# Patient Record
Sex: Female | Born: 1990 | Race: Black or African American | Hispanic: No | Marital: Single | Smoking: Former smoker
Health system: Southern US, Community
[De-identification: ages and names within clinical notes are randomized; demographics above are authoritative.]

## PROBLEM LIST (undated history)

## (undated) ENCOUNTER — Inpatient Hospital Stay (HOSPITAL_COMMUNITY): Payer: Self-pay

## (undated) DIAGNOSIS — L709 Acne, unspecified: Secondary | ICD-10-CM

## (undated) DIAGNOSIS — J189 Pneumonia, unspecified organism: Secondary | ICD-10-CM

## (undated) DIAGNOSIS — K219 Gastro-esophageal reflux disease without esophagitis: Secondary | ICD-10-CM

## (undated) DIAGNOSIS — A749 Chlamydial infection, unspecified: Secondary | ICD-10-CM

## (undated) HISTORY — DX: Pneumonia, unspecified organism: J18.9

## (undated) HISTORY — PX: OTHER SURGICAL HISTORY: SHX169

---
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
## 2011-01-04 ENCOUNTER — Inpatient Hospital Stay (HOSPITAL_COMMUNITY)
Admission: AD | Admit: 2011-01-04 | Discharge: 2011-01-04 | Disposition: A | Discharge status: Home / Self Care | Payer: Medicaid Other | Source: Ambulatory Visit | Attending: Obstetrics & Gynecology | Admitting: Obstetrics & Gynecology

## 2011-01-04 ENCOUNTER — Encounter (HOSPITAL_COMMUNITY): Payer: Self-pay | Admitting: *Deleted

## 2011-01-04 DIAGNOSIS — R109 Unspecified abdominal pain: Secondary | ICD-10-CM

## 2011-01-04 DIAGNOSIS — O26899 Other specified pregnancy related conditions, unspecified trimester: Secondary | ICD-10-CM

## 2011-01-04 DIAGNOSIS — O99891 Other specified diseases and conditions complicating pregnancy: Secondary | ICD-10-CM | POA: Insufficient documentation

## 2011-01-04 NOTE — Progress Notes (Signed)
Pt called for the 2nd time-she is not in the [place]-Kim,the registration clerk sttes she left

## 2011-01-04 NOTE — Progress Notes (Signed)
Pt states she was seen x2 days ago in MAU and had a +upt. Pt states she gave the wrong LMP, pt states she thinks she is 4 months. C/o lower abdominal cramping that started today, no vaginal bleeding.

## 2011-01-04 NOTE — Progress Notes (Signed)
Pt states she was here a couple days ago and found out she was preg. Based on her last LMP-sttes she thinks she gave the wrong LMP and wants to find out just how far along she is

## 2011-01-04 NOTE — ED Provider Notes (Signed)
History   Pt presents today wishing to have preg confirmation. She states she was seen several days ago but did not give the correct LMP information. She also now c/o mild lower abd cramping that comes and goes. She denies pain at this time.  Chief Complaint  Patient presents with  . Possible Pregnancy   HPI  OB History    Grav Para Term Preterm Abortions TAB SAB Ect Mult Living   3    1 1           Past Medical History  Diagnosis Date  . Asthma   . Chlamydia     Past Surgical History  Procedure Date  . Pregnancy termination     No family history on file.  History  Substance Use Topics  . Smoking status: Current Everyday Smoker -- 0.2 packs/day  . Smokeless tobacco: Not on file  . Alcohol Use: No    Allergies: No Known Allergies  Prescriptions prior to admission  Medication Sig Dispense Refill  . albuterol (VENTOLIN HFA) 108 (90 BASE) MCG/ACT inhaler Inhale 2 puffs into the lungs every 4 (four) hours as needed.  1 Inhaler  0  . beclomethasone (QVAR) 40 MCG/ACT inhaler Inhale 1 puff into the lungs 2 (two) times daily. 1 puff in the AM and 1 puff in the PM  1 Inhaler  1  . triamcinolone (KENALOG) 0.1 % ointment Apply topically 2 (two) times daily as needed. Apply to eczema. 80 GM tube       . Cetirizine HCl (ZYRTEC ALLERGY) 10 MG TBDP Take 1 tablet by mouth daily.  30 tablet  3  . metroNIDAZOLE (FLAGYL) 500 MG tablet Take 500 mg by mouth 2 (two) times daily. Take 1 tab by mouth two times a day x7 days       . SINGULAIR 10 MG tablet TAKE 1 TABLET BY MOUTH AT BEDTIME  30 tablet  6    Review of Systems  Constitutional: Negative for fever.  Cardiovascular: Negative for chest pain.  Gastrointestinal: Negative for nausea, vomiting, abdominal pain, diarrhea and constipation.  Genitourinary: Negative for dysuria, urgency, frequency and hematuria.  Neurological: Negative for dizziness and headaches.  Psychiatric/Behavioral: Negative for depression and suicidal ideas.    Physical Exam   Blood pressure 128/57, pulse 66, temperature 98.2 F (36.8 C), temperature source Oral, resp. rate 20, height 5\' 3"  (1.6 m), weight 198 lb (89.812 kg), last menstrual period 11/02/2010, unknown if currently breastfeeding.  Physical Exam  Constitutional: She is oriented to person, place, and time. She appears well-developed and well-nourished. No distress.  HENT:  Head: Normocephalic and atraumatic.  Eyes: EOM are normal. Pupils are equal, round, and reactive to light.  GI: Soft. She exhibits no distension. There is no tenderness. There is no rebound and no guarding.  Neurological: She is alert and oriented to person, place, and time.  Skin: Skin is warm and dry. She is not diaphoretic.  Psychiatric: She has a normal mood and affect. Her behavior is normal. Judgment and thought content normal.    MAU Course  Procedures  Bedside US shows single IUP at 13.1wks with cardiac activity.  Assessment and Plan  Pregnancy: discussed with pt at length. She will begin prenatal care. Discussed diet, activity, risks, and precautions.  Clinton Gallant. Rice III, DrHSc, MPAS, PA-C  01/04/2011, 10:49 PM   Henrietta Hoover, PA 01/04/11 2256

## 2011-01-04 NOTE — Progress Notes (Signed)
Pt called and she is not in the [place]

## 2011-01-19 ENCOUNTER — Other Ambulatory Visit: Payer: Self-pay

## 2011-01-19 DIAGNOSIS — Z331 Pregnant state, incidental: Secondary | ICD-10-CM

## 2011-01-19 NOTE — Progress Notes (Signed)
Prenatal labs done today Gail Walker 

## 2011-01-20 LAB — OBSTETRIC PANEL
Basophils Absolute: 0 10*3/uL (ref 0.0–0.1)
HCT: 37.3 % (ref 36.0–46.0)
Hepatitis B Surface Ag: NEGATIVE
Lymphocytes Relative: 22 % (ref 12–46)
Neutro Abs: 4.5 10*3/uL (ref 1.7–7.7)
Platelets: 262 10*3/uL (ref 150–400)
RDW: 14.2 % (ref 11.5–15.5)
Rubella: 37.1 IU/mL — ABNORMAL HIGH
WBC: 7.4 10*3/uL (ref 4.0–10.5)

## 2011-01-20 LAB — SICKLE CELL SCREEN: Sickle Cell Screen: NEGATIVE

## 2011-01-21 LAB — CULTURE, OB URINE: Colony Count: 8000

## 2011-01-26 ENCOUNTER — Ambulatory Visit (INDEPENDENT_AMBULATORY_CARE_PROVIDER_SITE_OTHER): Payer: Self-pay | Admitting: Family Medicine

## 2011-01-26 DIAGNOSIS — N760 Acute vaginitis: Secondary | ICD-10-CM

## 2011-01-26 DIAGNOSIS — O0990 Supervision of high risk pregnancy, unspecified, unspecified trimester: Secondary | ICD-10-CM | POA: Insufficient documentation

## 2011-01-26 DIAGNOSIS — J45909 Unspecified asthma, uncomplicated: Secondary | ICD-10-CM

## 2011-01-26 DIAGNOSIS — Z331 Pregnant state, incidental: Secondary | ICD-10-CM

## 2011-01-26 HISTORY — DX: Supervision of high risk pregnancy, unspecified, unspecified trimester: O09.90

## 2011-01-26 LAB — POCT WET PREP (WET MOUNT): Trichomonas Wet Prep HPF POC: NEGATIVE

## 2011-01-26 MED ORDER — PRENATAL RX 60-1 MG PO TABS: 1.0000 | ORAL_TABLET | Freq: Every day | ORAL | Status: DC

## 2011-01-26 MED ORDER — CETIRIZINE HCL 10 MG PO TBDP: 1.0000 | ORAL_TABLET | Freq: Every day | ORAL | Status: DC

## 2011-01-26 MED ORDER — TRIAMCINOLONE ACETONIDE 0.1 % EX OINT: TOPICAL_OINTMENT | Freq: Two times a day (BID) | CUTANEOUS | Status: DC | PRN

## 2011-01-26 MED ORDER — ALBUTEROL SULFATE HFA 108 (90 BASE) MCG/ACT IN AERS: 2.0000 | INHALATION_SPRAY | RESPIRATORY_TRACT | Status: DC | PRN

## 2011-01-26 NOTE — Patient Instructions (Signed)
Pregnancy - First Trimester During sexual intercourse, millions of sperm go into the vagina. Only 1 sperm will penetrate and fertilize the female egg while it is in the Fallopian tube. One week later, the fertilized egg implants into the wall of the uterus. An embryo begins to develop into a baby. At 6 to 8 weeks, the eyes and face are formed and the heartbeat can be seen on ultrasound. At the end of 12 weeks (first trimester), all the baby's organs are formed. Now that you are pregnant, you will want to do everything you can to have a healthy baby. Two of the most important things are to get good prenatal care and follow your caregiver's instructions. Prenatal care is all the medical care you receive before the baby's birth. It is given to prevent, find and treat problems during the pregnancy and childbirth. PRENATAL EXAMS:  During prenatal visits, your weight, blood pressure and urine are checked. This is done to make sure you are healthy and progressing normally during the pregnancy.   A pregnant woman should gain 25 to 35 pounds during the pregnancy. However, if you are over weight or underweight, your caregiver will advise you regarding your weight.   Your caregiver will ask and answer questions for you.   Blood work, cervical cultures, other necessary tests and a Pap test are done during your prenatal exams. These tests are done to check on your health and the probable health of your baby. Tests are strongly recommended and done for HIV with your permission. This is the virus that causes AIDS. These tests are done because medications can be given to help prevent your baby from being born with this infection should you have been infected without knowing it. Blood work is also used to find out your blood type, previous infections and follow your blood levels (hemoglobin).   Low hemoglobin (anemia) is common during pregnancy. Iron and vitamins are given to help prevent this. Later in the pregnancy,  blood tests for diabetes will be done along with any other tests if any problems develop. You may need tests to make sure you and the baby are doing well.   You may need other tests to make sure you and the baby are doing well.  CHANGES DURING THE FIRST TRIMESTER (THE FIRST 3 MONTHS OF PREGNANCY) Your body goes through many changes during pregnancy. They vary from person to person. Talk to your caregiver about changes you notice and are concerned about. Changes can include:  Your menstrual period stops.   The egg and sperm carry the genes that determine what you look like. Genes from you and your partner are forming a baby. The female genes determine whether the baby is a boy or a girl.   Your body increases in girth and you may feel bloated.   Feeling sick to your stomach (nauseous) and throwing up (vomiting). If the vomiting is uncontrollable, call your caregiver.   Your breasts will begin to enlarge and become tender.   Your nipples may stick out more and become darker.   The need to urinate more. Painful urination may mean you have a bladder infection.   Tiring easily.   Loss of appetite.   Cravings for certain kinds of food.   At first, you may gain or lose a couple of pounds.   You may have changes in your emotions from day to day (excited to be pregnant or concerned something may go wrong with the pregnancy and baby).     You may have more vivid and strange dreams.  HOME CARE INSTRUCTIONS  It is very important to avoid all smoking, alcohol and un-prescribed drugs during your pregnancy. These affect the formation and growth of the baby. Avoid chemicals while pregnant to ensure the delivery of a healthy infant.   Start your prenatal visits by the 12th week of pregnancy. They are usually scheduled monthly at first, then more often in the last 2 months before delivery. Keep your caregiver's appointments. Follow your caregiver's instructions regarding medication use, blood and lab  tests, exercise, and diet.   During pregnancy, you are providing food for you and your baby. Eat regular, well-balanced meals. Choose foods such as meat, fish, milk and other low fat dairy products, vegetables, fruits, and whole-grain breads and cereals. Your caregiver will tell you of the ideal weight gain.   You can help morning sickness by keeping soda crackers (saltines) at the bedside. Eat a couple before arising in the morning. You may want to use the crackers without salt on them.   Eating 4 to 5 small meals rather than 3 large meals a day also may help the nausea and vomiting.   Drinking liquids between meals instead of during meals also seems to help nausea and vomiting.   A physical sexual relationship may be continued throughout pregnancy if there are no other problems. Problems may be early (premature) leaking of amniotic fluid from the membranes, vaginal bleeding, or belly (abdominal) pain.   Exercise regularly if there are no restrictions. Check with your caregiver or physical therapist if you are unsure of the safety of some of your exercises. Greater weight gain will occur in the last 2 trimesters of pregnancy. Exercising will help:   Control your weight.   Keep you in shape.   Prepare you for labor and delivery.   Help you lose your pregnancy weight after you deliver your baby.   Wear a good support or jogging bra for breast tenderness during pregnancy. This may help if worn during sleep too.   Ask when prenatal classes are available. Begin classes when they are offered.   Do not use hot tubs, steam rooms or saunas.   Wear your seat belt when driving. This protects you and your baby if you are in an accident.   Avoid raw meat, uncooked cheese, cat litter boxes and soil used by cats throughout the pregnancy. These carry germs that can cause birth defects in the baby.   The first trimester is a good time to visit your dentist for your dental health. Getting your teeth  cleaned is OK. Use a softer toothbrush and brush gently during pregnancy.   Ask for help if you have financial, counseling or nutritional needs during pregnancy. Your caregiver will be able to offer counseling for these needs as well as refer you for other special needs.   Do not take any medications or herbs unless told by your caregiver.   Inform your caregiver if there is any mental or physical domestic violence.   Make a list of emergency phone numbers of family, friends, hospital, police and fire department.   Write down your questions. Take them to your prenatal visit.   Do not douche.   Do not cross your legs.   If you have to stand for long periods of time, rotate you feet or take small steps in a circle.   You may have more vaginal secretions that may require a sanitary pad. Do not use tampons   or scented sanitary pads.  MEDICATIONS AND DRUG USE IN PREGNANCY  Take prenatal vitamins as directed. The vitamin should contain 1 milligram of folic acid. Keep all vitamins out of reach of children. Only a couple vitamins or tablets containing iron may be fatal to a baby or young child when ingested.   Avoid use of all medications, including herbs, over-the-counter medications, not prescribed or suggested by your caregiver. Only take over-the-counter or prescription medicines for pain, discomfort, or fever as directed by your caregiver. Do not use aspirin, ibuprofen (Motrin, Advil, Nuprin) or naproxen (Aleve) unless OK'd by your caregiver.   Let your caregiver also know about herbs you may be using.   Alcohol is related to a number of birth defects. This includes fetal alcohol syndrome. All alcohol, in any form, should be avoided completely. Smoking will cause low birth rate and premature babies.   Street/illegal drugs are very harmful to the baby. They are absolutely forbidden. A baby born to an addicted mother will be addicted at birth. The baby will go through the same withdrawal  an adult does.   Let your caregiver know about any medications that you have to take and for what reason you take them.  MISCARRIAGE IS COMMON DURING PREGNANCY A miscarriage does not mean you did something wrong. It is not a reason to worry about getting pregnant again. Your caregiver will help you with questions you may have. If you have a miscarriage, you may need minor surgery (a D & C). SEEK MEDICAL CARE IF:  You have any concerns or worries during your pregnancy. It is better to call with your questions if you feel they cannot wait, rather than worry about them.  SEEK IMMEDIATE MEDICAL CARE IF:  An unexplained oral temperature above 101 develops, or as your caregiver suggests.   You have leaking of fluid from the vagina (birth canal). If leaking membranes are suspected, take your temperature and inform your caregiver of this when you call.   There is vaginal spotting or bleeding. Notify your caregiver of the amount and how many pads are used.   You develop a bad smelling vaginal discharge with a change in the color.   You continue to feel sick to your stomach (nauseated) and have no relief from remedies suggested. You vomit blood or coffee ground like materials.   You lose more than 2 pounds of weight in one week.   You gain more than 2 pounds of weight in a week and you notice swelling of your face, hands, feet or legs.   You gain 5 pounds or more in 1 week (even if you do not have swelling of your hands, face, legs or feet).   You get exposed to German measles and have never had them.   You are exposed to fifth disease or chicken pox.   You develop belly (abdominal) pain. Round ligament discomfort is a common non-cancerous (benign) cause of abdominal pain in pregnancy. Your caregiver still must evaluate this.   You develop headache, fever, diarrhea, pain with urination, or shortness of breath.   You fall, are in a car accident or have any kind of trauma.   There is mental  or physical violence in your home.  Document Released: 03/29/2001 Document Re-Released: 09/22/2009 [employer] Patient Information 2011 [employer]. 

## 2011-01-27 LAB — GC/CHLAMYDIA PROBE AMP, GENITAL: GC Probe Amp, Genital: NEGATIVE

## 2011-01-27 MED ORDER — MICONAZOLE NITRATE 200 MG VA SUPP: 200.0000 mg | Freq: Every day | VAGINAL | Status: AC

## 2011-01-27 NOTE — Assessment & Plan Note (Signed)
Wet prep showed yeast. Will treat with Miconazole per vagina

## 2011-01-27 NOTE — Progress Notes (Signed)
Gail Walker is a 20 year old female who was found to be incidentally pregnant while here in clinic back in August. She's been seen in [place] [facility] in the past several weeks for pregnancy confirmation. She did have a 10 week ultrasound which confirmed intrauterine pregnancy. Her due date she cannot remember her last menstrual period is based on her ultrasound.  She's been doing well and has no complaints currently. She previously was considering an abortion but did not have the funds this. She is nervous about being pregnant. She's had no vaginal bleeding, abdominal pain, dysuria. She does state she's had some increased urinary frequency for the last 2 weeks but no dysuria. She has also had some increased vaginal discharge.  Gen:  Alert, cooperative patient who appears stated age in no acute distress.  Vital signs reviewed. Cardiac:  Regular rate and rhythm without murmur auscultated.  Good S1/S2. Pulm:  Clear to auscultation bilaterally with good air movement.  No wheezes or rales noted.   Abd:  Fundal heights consistent with dates.  FHTs good.  Good bowel sounds.  No pain on palpation. GYN:  External genitalia within normal limits.  Vaginal mucosa pink, moist, normal rugae.  Nonfriable cervix without lesions, some thick whitish discharge noted, but no bleeding noted on speculum exam.  Bimanual exam revealed normal, nongravid uterus.  No cervical motion tenderness. No adnexal masses bilaterally.    A/P:  A 20 year old G3 P0 010 at 78.[redacted] weeks gestational age by a second trimester ultrasound: - She is doing well from a pregnancy standpoint. - Will obtain anatomy ultrasound today.  She does seem to be pretty nervous about pregnancy. She states that she is excited sometimes about being pregnant. - Declined genetic screening today - Gave red flags in verbal warnings regarding preterm labor and reasons to return or go to the [facility]. - Obtained GC and chlamydia today.  - Sent patient to bathroom to obtain urine culture  and urinalysis as she has had increased urinary frequency for the past week. However since the patient left and did not leave a urine specimen. -  FU in 4 weeks

## 2011-01-31 ENCOUNTER — Encounter: Payer: Self-pay | Admitting: Family Medicine

## 2011-01-31 NOTE — Progress Notes (Addendum)
Note reviewed.  Agree with plan. 1) Teen - support resources could include [employer] trader if Medicaid eligible, [facility] and Teen programs at [place].   2) RH negative - needs repeat antibody titers and Rhogam at 28 weeks. 3) Obesity - needs early glucola ASAP 4) Pregnancy - Dating by 13 week sono.  Needs flu shot.  Also likely needs Tdap after 20 weeks. 5) Smoker - cessation could prevent complications to pt, fetus and infant.

## 2011-02-01 ENCOUNTER — Telehealth: Payer: Self-pay | Admitting: Family Medicine

## 2011-02-01 NOTE — Telephone Encounter (Signed)
Gail Walker is calling to find out when her appointment for her Ultrasound is going to be.

## 2011-02-01 NOTE — Telephone Encounter (Signed)
PT  Informed of appt.Gail Walker [place]

## 2011-02-02 ENCOUNTER — Telehealth: Payer: Self-pay | Admitting: *Deleted

## 2011-02-02 NOTE — Telephone Encounter (Signed)
Pt informed of appt for [place] at [facility] on 10.29.2012 @ 215 pm.Gail Walker, [name]

## 2011-02-14 ENCOUNTER — Ambulatory Visit (HOSPITAL_COMMUNITY)
Admission: RE | Admit: 2011-02-14 | Discharge: 2011-02-14 | Disposition: A | Discharge status: Home / Self Care | Payer: Self-pay | Source: Ambulatory Visit | Attending: Family Medicine | Admitting: Family Medicine

## 2011-02-14 DIAGNOSIS — Z331 Pregnant state, incidental: Secondary | ICD-10-CM

## 2011-02-14 DIAGNOSIS — Z3689 Encounter for other specified antenatal screening: Secondary | ICD-10-CM | POA: Insufficient documentation

## 2011-02-23 ENCOUNTER — Ambulatory Visit (INDEPENDENT_AMBULATORY_CARE_PROVIDER_SITE_OTHER): Payer: Self-pay | Admitting: Family Medicine

## 2011-02-23 VITALS — BP 127/75 | Temp 97.4°F | Wt 204.9 lb

## 2011-02-23 DIAGNOSIS — Z331 Pregnant state, incidental: Secondary | ICD-10-CM

## 2011-02-23 DIAGNOSIS — O26899 Other specified pregnancy related conditions, unspecified trimester: Secondary | ICD-10-CM

## 2011-02-23 DIAGNOSIS — Z6791 Unspecified blood type, Rh negative: Secondary | ICD-10-CM

## 2011-02-23 DIAGNOSIS — F17200 Nicotine dependence, unspecified, uncomplicated: Secondary | ICD-10-CM

## 2011-02-23 DIAGNOSIS — O360990 Maternal care for other rhesus isoimmunization, unspecified trimester, not applicable or unspecified: Secondary | ICD-10-CM

## 2011-02-23 HISTORY — DX: Unspecified blood type, rh negative: Z67.91

## 2011-02-23 NOTE — Assessment & Plan Note (Signed)
Counseled to quit 

## 2011-02-23 NOTE — Assessment & Plan Note (Signed)
Repeat Ab testing and Rhogam at 28 weeks.    

## 2011-02-23 NOTE — Progress Notes (Signed)
20 yo G2P0010 at 20.[redacted] wks GA via LMP and confirmed with 2nd trimester [place] here for OB visit. - doing well today, no concerns. - discussed glucola, she doesn't have time today but will be back within the week for lab appt. - smoking: states she's very much cut back, only 1 or 2 "sometimes."  Counseled to quit completely - discussed referral to [facility].  Will flag Dr. [place] to ask how to best do this - states that Medicaid called her and she should receive in 1 week  - flu shot given today

## 2011-02-23 NOTE — Patient Instructions (Signed)
Make an appt with [facility].  It's here in this office. Come back and see me in 4 weeks if you can't be seen for a while.   In the next week come back for a lab appt to test your sugar.  It was good to see you today.

## 2011-02-26 NOTE — Assessment & Plan Note (Signed)
Adding to visit diagnoses

## 2011-02-28 ENCOUNTER — Telehealth: Payer: Self-pay | Admitting: Family Medicine

## 2011-02-28 NOTE — Telephone Encounter (Signed)
Called and set up referral for [facility] today. They state that will give her a call

## 2011-03-03 ENCOUNTER — Other Ambulatory Visit: Payer: Self-pay

## 2011-03-17 ENCOUNTER — Ambulatory Visit (INDEPENDENT_AMBULATORY_CARE_PROVIDER_SITE_OTHER): Payer: Self-pay | Admitting: Family Medicine

## 2011-03-17 VITALS — BP 107/71 | Temp 97.3°F | Wt 207.4 lb

## 2011-03-17 DIAGNOSIS — Z331 Pregnant state, incidental: Secondary | ICD-10-CM

## 2011-03-17 NOTE — Progress Notes (Signed)
EMR does not indicate flu vaccination status for patient.  To verify at next visit and offer influenza vaccine at next visit. Chevy Sweigert O

## 2011-03-17 NOTE — Patient Instructions (Signed)
It was a pleasure to meet you today in OB clinic!  You are 23 weeks and 3 days into your pregnancy.   Congratulations on quitting smoking!  Your baby's heart rate is normal today; your uterus size is bigger than I would expect for your gestational age.  Therefore, I would like to order another ultrasound to check on this.  I am going to put our social worker in touch with you to be sure your Medicaid is going through properly.   You will come back to see Dr. Gwendolyn Grant in 4 more weeks.  At this time you will get a shot, called RhoGam, that must be given at a specific time in pregnancy.  It is VERY IMPORTANT that you come for this visit at around 27-28 weeks.

## 2011-03-17 NOTE — Progress Notes (Signed)
Seen in OB clinic, now EGA 23.3.  RH NEGATIVE, discussed need for Ab titer and RhoGam to be given at next visit in 4 weeks.   Size>dates today; will order repeat US at [facility].  She states that she was told at [facility] office to bring them her bills, they would pay.  I am forwarding to CSW for confirmation of her Medicaid status.  1hrGTT being done today (early), for risk factors of ethnicity and obesity, now LGA.  Asthma doing better, not using rescue or QVar at all. Quit smoking at beginning of pregnancy.   She screens negative on verbal PHQ2; working at [employer], feels good support from mother (smoker). Not interested in [place] or other social support networks.  To consider continued surveillance for depression. Follow up OB with PCP in 4 weeks, RHOGAM and AB TITER then.  Lora Chavers O

## 2011-03-22 ENCOUNTER — Ambulatory Visit (HOSPITAL_COMMUNITY)
Admission: RE | Admit: 2011-03-22 | Discharge: 2011-03-22 | Disposition: A | Discharge status: Home / Self Care | Payer: Medicaid Other | Source: Ambulatory Visit | Attending: Family Medicine | Admitting: Family Medicine

## 2011-03-22 DIAGNOSIS — Z3689 Encounter for other specified antenatal screening: Secondary | ICD-10-CM | POA: Insufficient documentation

## 2011-03-22 DIAGNOSIS — O3660X0 Maternal care for excessive fetal growth, unspecified trimester, not applicable or unspecified: Secondary | ICD-10-CM | POA: Insufficient documentation

## 2011-03-29 ENCOUNTER — Telehealth: Payer: Self-pay | Admitting: Clinical

## 2011-03-29 NOTE — Telephone Encounter (Signed)
Clinical Child psychotherapist (CSW) received a referral to assist pt in making sure Medicaid goes through properly. CSW left a message for both numbers listed on pt demographics and informed pt that CSW will be in the office Tues-Thurs 8:30am-11am.  Theresia Bough, MSW, Theresia Majors [phone]

## 2011-04-06 ENCOUNTER — Ambulatory Visit: Payer: Self-pay | Admitting: Family Medicine

## 2011-04-06 VITALS — BP 122/76 | Wt 213.0 lb

## 2011-04-06 DIAGNOSIS — Z331 Pregnant state, incidental: Secondary | ICD-10-CM

## 2011-04-06 DIAGNOSIS — J45909 Unspecified asthma, uncomplicated: Secondary | ICD-10-CM

## 2011-04-06 DIAGNOSIS — Z6791 Unspecified blood type, Rh negative: Secondary | ICD-10-CM

## 2011-04-06 NOTE — Assessment & Plan Note (Signed)
Repeat Ab testing and Rhogam at 28 weeks.

## 2011-04-06 NOTE — Assessment & Plan Note (Signed)
No symptoms currently per patient.  Has not needed inhaler

## 2011-04-06 NOTE — Progress Notes (Signed)
Measuring roughly at age today.  Repeat US showed growth at 50%. Patient has received flu vaccine.   No complaints currently: no bleeding, discharge, cramping, abdominal pain.  Some difficulty sleeping at night as she cannot find comfortable position, but denies any back pain.

## 2011-04-07 MED ORDER — RHO D IMMUNE GLOBULIN 1500 UNIT/2ML IJ SOLN: 300.0000 ug | Freq: Once | INTRAMUSCULAR | Status: DC

## 2011-04-26 ENCOUNTER — Ambulatory Visit (INDEPENDENT_AMBULATORY_CARE_PROVIDER_SITE_OTHER): Payer: Self-pay | Admitting: Family Medicine

## 2011-04-26 VITALS — BP 122/72 | Temp 97.6°F | Wt 217.0 lb

## 2011-04-26 DIAGNOSIS — L709 Acne, unspecified: Secondary | ICD-10-CM

## 2011-04-26 DIAGNOSIS — R21 Rash and other nonspecific skin eruption: Secondary | ICD-10-CM | POA: Insufficient documentation

## 2011-04-26 DIAGNOSIS — L708 Other acne: Secondary | ICD-10-CM

## 2011-04-26 DIAGNOSIS — R12 Heartburn: Secondary | ICD-10-CM | POA: Insufficient documentation

## 2011-04-26 DIAGNOSIS — Z6791 Unspecified blood type, Rh negative: Secondary | ICD-10-CM

## 2011-04-26 DIAGNOSIS — O360990 Maternal care for other rhesus isoimmunization, unspecified trimester, not applicable or unspecified: Secondary | ICD-10-CM

## 2011-04-26 DIAGNOSIS — Z331 Pregnant state, incidental: Secondary | ICD-10-CM

## 2011-04-26 DIAGNOSIS — O26899 Other specified pregnancy related conditions, unspecified trimester: Secondary | ICD-10-CM

## 2011-04-26 LAB — CBC
MCH: 28.6 pg (ref 26.0–34.0)
MCHC: 33.8 g/dL (ref 30.0–36.0)
MCV: 84.8 fL (ref 78.0–100.0)
Platelets: 228 10*3/uL (ref 150–400)
RDW: 13.5 % (ref 11.5–15.5)

## 2011-04-26 LAB — RPR

## 2011-04-26 MED ORDER — RANITIDINE HCL 150 MG PO TABS: 150.0000 mg | ORAL_TABLET | Freq: Two times a day (BID) | ORAL | Status: DC

## 2011-04-26 MED ORDER — CLINDAMYCIN PHOS-BENZOYL PEROX 1.2-5 % EX GEL: 1.0000 [drp] | Freq: Two times a day (BID) | CUTANEOUS | Status: DC

## 2011-04-26 NOTE — Assessment & Plan Note (Signed)
Duac for relief.

## 2011-04-26 NOTE — Patient Instructions (Signed)
If you have any bleeding, discharge, abdominal pain or cramping, or the contractions like we talked about come back and see me or go to the [facility]U. Come back and see me in 2 weeks.     Pregnancy - Second Trimester  The second trimester of pregnancy (3 to 6 months) is a period of rapid growth for you and your baby. At the end of the sixth month, your baby is about 9 inches long and weighs 1 1/2 pounds. You will begin to feel the baby move between 18 and 20 weeks of the pregnancy. This is called quickening. Weight gain is faster. A clear fluid (colostrum) may leak out of your breasts. You may feel small contractions of the womb (uterus). This is known as false labor or Frett-Hicks contractions. This is like a practice for labor when the baby is ready to be born. Usually, the problems with morning sickness have usually passed by the end of your first trimester. Some women develop small dark blotches (called cholasma, mask of pregnancy) on their face that usually goes away after the baby is born. Exposure to the sun makes the blotches worse. Acne may also develop in some pregnant women and pregnant women who have acne, may find that it goes away. PRENATAL EXAMS  Blood work may continue to be done during prenatal exams. These tests are done to check on your health and the probable health of your baby. Blood work is used to follow your blood levels (hemoglobin). Anemia (low hemoglobin) is common during pregnancy. Iron and vitamins are given to help prevent this. You will also be checked for diabetes between 24 and 28 weeks of the pregnancy. Some of the previous blood tests may be repeated.   The size of the uterus is measured during each visit. This is to make sure that the baby is continuing to grow properly according to the dates of the pregnancy.   Your blood pressure is checked every prenatal visit. This is to make sure you are not getting toxemia.   Your urine is checked to make sure you do not have an  infection, diabetes or protein in the urine.   Your weight is checked often to make sure gains are happening at the suggested rate. This is to ensure that both you and your baby are growing normally.   Sometimes, an ultrasound is performed to confirm the proper growth and development of the baby. This is a test which bounces harmless sound waves off the baby so your caregiver can more accurately determine due dates.  Sometimes, a specialized test is done on the amniotic fluid surrounding the baby. This test is called an amniocentesis. The amniotic fluid is obtained by sticking a needle into the belly (abdomen). This is done to check the chromosomes in instances where there is a concern about possible genetic problems with the baby. It is also sometimes done near the end of pregnancy if an early delivery is required. In this case, it is done to help make sure the baby's lungs are mature enough for the baby to live outside of the womb. CHANGES OCCURING IN THE SECOND TRIMESTER OF PREGNANCY Your body goes through many changes during pregnancy. They vary from person to person. Talk to your caregiver about changes you notice that you are concerned about.  During the second trimester, you will likely have an increase in your appetite. It is normal to have cravings for certain foods. This varies from person to person and pregnancy to  pregnancy.   Your lower abdomen will begin to bulge.   You may have to urinate more often because the uterus and baby are pressing on your bladder. It is also common to get more bladder infections during pregnancy (pain with urination). You can help this by drinking lots of fluids and emptying your bladder before and after intercourse.   You may begin to get stretch marks on your hips, abdomen, and breasts. These are normal changes in the body during pregnancy. There are no exercises or medications to take that prevent this change.   You may begin to develop swollen and bulging  veins (varicose veins) in your legs. Wearing support hose, elevating your feet for 15 minutes, 3 to 4 times a day and limiting salt in your diet helps lessen the problem.   Heartburn may develop as the uterus grows and pushes up against the stomach. Antacids recommended by your caregiver helps with this problem. Also, eating smaller meals 4 to 5 times a day helps.   Constipation can be treated with a stool softener or adding bulk to your diet. Drinking lots of fluids, vegetables, fruits, and whole grains are helpful.   Exercising is also helpful. If you have been very active up until your pregnancy, most of these activities can be continued during your pregnancy. If you have been less active, it is helpful to start an exercise program such as walking.   Hemorrhoids (varicose veins in the rectum) may develop at the end of the second trimester. Warm sitz baths and hemorrhoid cream recommended by your caregiver helps hemorrhoid problems.   Backaches may develop during this time of your pregnancy. Avoid heavy lifting, wear low heal shoes and practice good posture to help with backache problems.   Some pregnant women develop tingling and numbness of their hand and fingers because of swelling and tightening of ligaments in the wrist (carpel tunnel syndrome). This goes away after the baby is born.   As your breasts enlarge, you may have to get a bigger bra. Get a comfortable, cotton, support bra. Do not get a nursing bra until the last month of the pregnancy if you will be nursing the baby.   You may get a dark line from your belly button to the pubic area called the linea nigra.   You may develop rosy cheeks because of increase blood flow to the face.   You may develop spider looking lines of the face, neck, arms and chest. These go away after the baby is born.  HOME CARE INSTRUCTIONS   It is extremely important to avoid all smoking, herbs, alcohol, and unprescribed drugs during your pregnancy.  These chemicals affect the formation and growth of the baby. Avoid these chemicals throughout the pregnancy to ensure the delivery of a healthy infant.   Most of your home care instructions are the same as suggested for the first trimester of your pregnancy. Keep your caregiver's appointments. Follow your caregiver's instructions regarding medication use, exercise and diet.   During pregnancy, you are providing food for you and your baby. Continue to eat regular, well-balanced meals. Choose foods such as meat, fish, milk and other low fat dairy products, vegetables, fruits, and whole-grain breads and cereals. Your caregiver will tell you of the ideal weight gain.   A physical sexual relationship may be continued up until near the end of pregnancy if there are no other problems. Problems could include early (premature) leaking of amniotic fluid from the membranes, vaginal bleeding, abdominal  pain, or other medical or pregnancy problems.   Exercise regularly if there are no restrictions. Check with your caregiver if you are unsure of the safety of some of your exercises. The greatest weight gain will occur in the last 2 trimesters of pregnancy. Exercise will help you:   Control your weight.   Get you in shape for labor and delivery.   Lose weight after you have the baby.   Wear a good support or jogging bra for breast tenderness during pregnancy. This may help if worn during sleep. Pads or tissues may be used in the bra if you are leaking colostrum.   Do not use hot tubs, steam rooms or saunas throughout the pregnancy.   Wear your seat belt at all times when driving. This protects you and your baby if you are in an accident.   Avoid raw meat, uncooked cheese, cat litter boxes and soil used by cats. These carry germs that can cause birth defects in the baby.   The second trimester is also a good time to visit your dentist for your dental health if this has not been done yet. Getting your teeth  cleaned is OK. Use a soft toothbrush. Brush gently during pregnancy.   It is easier to loose urine during pregnancy. Tightening up and strengthening the pelvic muscles will help with this problem. Practice stopping your urination while you are going to the bathroom. These are the same muscles you need to strengthen. It is also the muscles you would use as if you were trying to stop from passing gas. You can practice tightening these muscles up 10 times a set and repeating this about 3 times per day. Once you know what muscles to tighten up, do not perform these exercises during urination. It is more likely to contribute to an infection by backing up the urine.   Ask for help if you have financial, counseling or nutritional needs during pregnancy. Your caregiver will be able to offer counseling for these needs as well as refer you for other special needs.   Your skin may become oily. If so, wash your face with mild soap, use non-greasy moisturizer and oil or cream based makeup.  MEDICATIONS AND DRUG USE IN PREGNANCY  Take prenatal vitamins as directed. The vitamin should contain 1 milligram of folic acid. Keep all vitamins out of reach of children. Only a couple vitamins or tablets containing iron may be fatal to a baby or young child when ingested.   Avoid use of all medications, including herbs, over-the-counter medications, not prescribed or suggested by your caregiver. Only take over-the-counter or prescription medicines for pain, discomfort, or fever as directed by your caregiver. Do not use aspirin.   Let your caregiver also know about herbs you may be using.   Alcohol is related to a number of birth defects. This includes fetal alcohol syndrome. All alcohol, in any form, should be avoided completely. Smoking will cause low birth rate and premature babies.   Street or illegal drugs are very harmful to the baby. They are absolutely forbidden. A baby born to an addicted mother will be addicted at  birth. The baby will go through the same withdrawal an adult does.  SEEK MEDICAL CARE IF:  You have any concerns or worries during your pregnancy. It is better to call with your questions if you feel they cannot wait, rather than worry about them. SEEK IMMEDIATE MEDICAL CARE IF:   An unexplained oral temperature above 102 F (  38.9 C) develops, or as your caregiver suggests.   You have leaking of fluid from the vagina (birth canal). If leaking membranes are suspected, take your temperature and tell your caregiver of this when you call.   There is vaginal spotting, bleeding, or passing clots. Tell your caregiver of the amount and how many pads are used. Light spotting in pregnancy is common, especially following intercourse.   You develop a bad smelling vaginal discharge with a change in the color from clear to white.   You continue to feel sick to your stomach (nauseated) and have no relief from remedies suggested. You vomit blood or coffee ground-like materials.   You lose more than 2 pounds of weight or gain more than 2 pounds of weight over 1 week, or as suggested by your caregiver.   You notice swelling of your face, hands, feet, or legs.   You get exposed to [place] measles and have never had them.   You are exposed to fifth disease or chickenpox.   You develop belly (abdominal) pain. Round ligament discomfort is a common non-cancerous (benign) cause of abdominal pain in pregnancy. Your caregiver still must evaluate you.   You develop a bad headache that does not go away.   You develop fever, diarrhea, pain with urination, or shortness of breath.   You develop visual problems, blurry, or double vision.   You fall or are in a car accident or any kind of trauma.   There is mental or physical violence at home.  Document Released: 03/29/2001 Document Revised: 12/15/2010 Document Reviewed: 10/01/2008 [facility] Patient Information 2012 [place], [place].

## 2011-04-26 NOTE — Assessment & Plan Note (Signed)
Ranitidine for relief.

## 2011-04-26 NOTE — Assessment & Plan Note (Signed)
Improved

## 2011-04-26 NOTE — Assessment & Plan Note (Signed)
Repeat Ab screen and Rhogam obtained today.

## 2011-04-26 NOTE — Progress Notes (Signed)
21 yo G2P0010 at 29.1 wks today.  No complaints except for acne and heartburn.  See problem list.  28 week labs completed today, Rhogam provided.   1 Hour glucola today as well.  Continues to smoke 1-2 cigarettes a day, counseled to quit completely.

## 2011-05-10 ENCOUNTER — Ambulatory Visit (INDEPENDENT_AMBULATORY_CARE_PROVIDER_SITE_OTHER): Payer: Medicaid Other | Admitting: Family Medicine

## 2011-05-10 VITALS — BP 128/74 | Temp 97.9°F | Wt 219.8 lb

## 2011-05-10 DIAGNOSIS — F17200 Nicotine dependence, unspecified, uncomplicated: Secondary | ICD-10-CM

## 2011-05-10 DIAGNOSIS — Z331 Pregnant state, incidental: Secondary | ICD-10-CM

## 2011-05-10 DIAGNOSIS — O360990 Maternal care for other rhesus isoimmunization, unspecified trimester, not applicable or unspecified: Secondary | ICD-10-CM

## 2011-05-10 DIAGNOSIS — Z6791 Unspecified blood type, Rh negative: Secondary | ICD-10-CM

## 2011-05-10 NOTE — Patient Instructions (Signed)
Come back and see me in 2 weeks.   Everything is looking good!  Pregnancy - Third Trimester  The third trimester begins at the 28th week of pregnancy and ends at birth. It is important to follow your doctor's instructions. HOME CARE   Go to your doctor's visits.   Do not smoke.   Do not drink alcohol or use drugs.   Only take medicine as told by your doctor.   Take prenatal vitamins as told. The vitamin should contain 1 milligram of folic acid.   Exercise.   Eat healthy foods. Eat regular, well-balanced meals.   You can have sex (intercourse) if there are no other problems with the pregnancy.   Do not use hot tubs, steam rooms, or saunas.   Wear a seat belt while driving.   Avoid raw meat, uncooked cheese, and litter boxes and soil used by cats.   Rest with your legs raised (elevated).   Make a list of emergency phone numbers. Keep this list with you.   Arrange for help when you come back home after delivering the baby.   Make a trial run to the hospital.   Take prenatal classes.   Prepare the baby's nursery.   Do not travel out of the city. If you absolutely have to, get permission from your doctor first.   Wear flat shoes. Do not wear high heels.  GET HELP RIGHT AWAY IF:   You have a temperature by mouth above 102 F (38.9 C), not controlled by medicine.   You have not felt the baby move for more than 1 hour. If you think the baby is not moving as much as normal, eat something with sugar in it or lie down on your left side for an hour. The baby should move at least 4 to 5 times per hour.   Fluid is coming from the vagina.   Blood is coming from the vagina. Light spotting is common, especially after sex (intercourse).   You have belly (abdominal) pain.   You have a bad smelling fluid (discharge) coming from the vagina. The fluid changes from clear to white.   You still feel sick to your stomach (nauseous).   You throw up (vomit) for more than 24 hours.     You have the chills.   You have shortness of breath.   You have a burning feeling when you pee (urinate).   You lose or gain more than 2 pounds (0.9 kilograms) of weight over a week, or as told by your doctor.   Your face, hands, feet, or legs get puffy (swell).   You have a bad headache that will not go away.   You start to have problems seeing (blurry or double vision).   You fall, are in a car accident, or have any kind of trauma.   There is mental or physical violence at home.   You have any concerns or worries during your pregnancy.  MAKE SURE YOU:   Understand these instructions.   Will watch your condition.   Will get help right away if you are not doing well or get worse.  Document Released: 06/29/2009 Document Revised: 12/15/2010 Document Reviewed: 06/29/2009 [facility] Patient Information 2012 [place], [place].

## 2011-05-10 NOTE — Assessment & Plan Note (Signed)
Counseled to quit 

## 2011-05-10 NOTE — Assessment & Plan Note (Signed)
Rhogam given last visit.

## 2011-05-10 NOTE — Progress Notes (Signed)
21 yo G2P0010 at 31.1 wks today.  Doing well, no complaints: no bleeding, discharge, cramping, abdominal pain. Some difficulty sleeping at night as she cannot find comfortable position, but denies any back pain.  1 hour Glucola completed and WNL last visit.   Rhogam provided last visit.   Counseled to quit completely for tobacco use.   FU in 2 weeks.

## 2011-05-16 ENCOUNTER — Inpatient Hospital Stay (HOSPITAL_COMMUNITY)
Admission: AD | Admit: 2011-05-16 | Discharge: 2011-05-16 | Disposition: A | Discharge status: Home / Self Care | Payer: Medicaid Other | Source: Ambulatory Visit | Attending: Obstetrics & Gynecology | Admitting: Obstetrics & Gynecology

## 2011-05-16 ENCOUNTER — Encounter (HOSPITAL_COMMUNITY): Payer: Self-pay | Admitting: *Deleted

## 2011-05-16 DIAGNOSIS — R109 Unspecified abdominal pain: Secondary | ICD-10-CM | POA: Insufficient documentation

## 2011-05-16 DIAGNOSIS — O99891 Other specified diseases and conditions complicating pregnancy: Secondary | ICD-10-CM | POA: Insufficient documentation

## 2011-05-16 DIAGNOSIS — O479 False labor, unspecified: Secondary | ICD-10-CM

## 2011-05-16 HISTORY — DX: Gastro-esophageal reflux disease without esophagitis: K21.9

## 2011-05-16 HISTORY — DX: Acne, unspecified: L70.9

## 2011-05-16 MED ORDER — NIFEDIPINE 10 MG PO CAPS
10.0000 mg | ORAL_CAPSULE | Freq: Once | ORAL | Status: AC
  Administered 2011-05-16: 10 mg via ORAL
  Filled 2011-05-16: qty 1

## 2011-05-16 MED ORDER — NIFEDIPINE 10 MG PO CAPS: 10.0000 mg | ORAL_CAPSULE | Freq: Four times a day (QID) | ORAL | Status: DC | PRN

## 2011-05-16 MED ORDER — BETAMETHASONE SOD PHOS & ACET 6 (3-3) MG/ML IJ SUSP
12.0000 mg | Freq: Once | INTRAMUSCULAR | Status: AC
  Administered 2011-05-16: 12 mg via INTRAMUSCULAR
  Filled 2011-05-16: qty 2

## 2011-05-16 NOTE — Progress Notes (Signed)
PT SAYS SHE IS HAVING SHARP  PAINS IN LOWER ABD - NONE NOW- BUT DID BEFORE  SHE CAME . NOT CONSTANT .   BABY HAS MOVED TODAY. UNSURE IF ABD IS HARD WHEN HURTS.    ALL STARTED   ON Thursday-  DID NOT CALL DR.  WAS SEEN LAST ON  05-10-2011

## 2011-05-16 NOTE — ED Provider Notes (Signed)
History   Gail Walker is a 21 y.o. G2P0010 at [redacted]w[redacted]d presenting for reports of intermittent sharp bilateral lower abdominal pain upon standing or changing positions since Thursday.  Also occasional contractions.  Reports +fm. Denies VB or LOF.  Next appointment w/ Dr. Renold Don next Tuesday 2/5.   No chief complaint on file.  HPI  OB History    Grav Para Term Preterm Abortions TAB SAB Ect Mult Living   2 0   1 1    0      Past Medical History  Diagnosis Date   Asthma    Chlamydia    GERD (gastroesophageal reflux disease)    Acne     Past Surgical History  Procedure Date   Pregnancy termination     History reviewed. No pertinent family history.  History  Substance Use Topics   Smoking status: Current Everyday Smoker -- 0.2 packs/day   Smokeless tobacco: Not on file   Alcohol Use: No    Allergies: No Known Allergies  Prescriptions prior to admission  Medication Sig Dispense Refill   albuterol (VENTOLIN HFA) 108 (90 BASE) MCG/ACT inhaler Inhale 2 puffs into the lungs every 4 (four) hours as needed.  1 Inhaler  0   Clindamycin-Benzoyl Per, Refr, gel Apply 1 drop topically 2 (two) times daily.  45 g  0   Prenatal Vit-Fe Fumarate-FA (PRENATAL MULTIVITAMIN) 60-1 MG tablet Take 1 tablet by mouth daily.  30 tablet  11   ranitidine (ZANTAC) 150 MG tablet Take 1 tablet (150 mg total) by mouth 2 (two) times daily.  31 tablet  3   triamcinolone (KENALOG) 0.1 % ointment Apply topically 2 (two) times daily as needed. Apply to eczema. 80 GM tube  80 g  1   rho,d, immune globulin (RHIG/RHOPHYLAC) 1500 UNIT/2ML SOLN injection Inject 2 mLs (300 mcg total) into the muscle once.  2 mL  0    Review of Systems  Constitutional: Negative.   HENT: Negative.   Eyes: Negative.   Respiratory: Negative.   Cardiovascular: Negative.   Gastrointestinal: Positive for abdominal pain (intermittent sharp bilateral lower abdominal pain upon standing or changing positions;  occasional 'contractions').  Genitourinary: Negative.   Musculoskeletal: Negative.   Skin: Negative.   Neurological: Negative.   Endo/Heme/Allergies: Negative.   Psychiatric/Behavioral: Negative.    Physical Exam   Blood pressure 123/73, pulse 102, temperature 97.9 F (36.6 C), temperature source Oral, resp. rate 16, height 5\' 3"  (1.6 m), weight 100.415 kg (221 lb 6 oz), last menstrual period 11/02/2010, unknown if currently breastfeeding.  Physical Exam  Constitutional: She is oriented to person, place, and time. She appears well-developed and well-nourished.  HENT:  Head: Normocephalic.  Eyes: Pupils are equal, round, and reactive to light.  Neck: Normal range of motion.  Cardiovascular: Normal rate and regular rhythm.   Respiratory: Effort normal and breath sounds normal.  GI: Soft.  Genitourinary: Vagina normal and uterus normal.  Musculoskeletal: Normal range of motion.  Neurological: She is alert and oriented to person, place, and time. She has normal reflexes.  Skin: Skin is warm and dry.  Psychiatric: She has a normal mood and affect. Her behavior is normal. Judgment and thought content normal.   SVE: 1/75/-3, vtx FHR: 150, moderate variabilty, 15x15accels, no decels= Cat I UCs: uc  MAU Course  Procedures  EFM SVE BMZ x 1, to return in 24 hours for 2nd dose Procardia 10mg  po x 1 dose  Assessment and Plan  A:  IUP @ 32.0wks     FHR: Cat I     Stable maternal/fetal status     Bilateral round ligament pain     Preterm cervical changes  P: Reviewed ways to decrease RLP     To return in 24hrs for 2nd BMZ      Rx for Procardia 10mg  prn cramping/uc's      Keep appt as scheduled for 2/5 w/ Dr. Renold Don      Return to MAU for ptl, srom, vb, dereased fm, or any other concerns  POC reviewed w/ Dorathy Kinsman, CNM and Dr. Pasty Arch, SNM 05/16/2011, 9:12 PM

## 2011-05-16 NOTE — Progress Notes (Signed)
Pt c/o sharp, lower abd pain off and on since last Thursday.

## 2011-05-17 ENCOUNTER — Ambulatory Visit (HOSPITAL_COMMUNITY): Payer: Medicaid Other

## 2011-05-17 ENCOUNTER — Inpatient Hospital Stay (HOSPITAL_COMMUNITY)
Admission: AD | Admit: 2011-05-17 | Discharge: 2011-05-17 | Disposition: A | Discharge status: Home / Self Care | Payer: Medicaid Other | Source: Ambulatory Visit | Attending: Obstetrics & Gynecology | Admitting: Obstetrics & Gynecology

## 2011-05-17 DIAGNOSIS — O4700 False labor before 37 completed weeks of gestation, unspecified trimester: Secondary | ICD-10-CM | POA: Insufficient documentation

## 2011-05-17 MED ORDER — NIFEDIPINE 10 MG PO CAPS: 10.0000 mg | ORAL_CAPSULE | Freq: Four times a day (QID) | ORAL | Status: DC | PRN

## 2011-05-17 MED ORDER — BETAMETHASONE SOD PHOS & ACET 6 (3-3) MG/ML IJ SUSP
12.0000 mg | Freq: Once | INTRAMUSCULAR | Status: AC
  Administered 2011-05-17: 12 mg via INTRAMUSCULAR
  Filled 2011-05-17: qty 2

## 2011-05-17 NOTE — Progress Notes (Signed)
Pt sttes she is here to receive the shot for the baby's lungs-was here last night and got her 1st shot

## 2011-05-24 ENCOUNTER — Encounter: Payer: Medicaid Other | Admitting: Family Medicine

## 2011-05-24 ENCOUNTER — Ambulatory Visit (INDEPENDENT_AMBULATORY_CARE_PROVIDER_SITE_OTHER): Payer: Medicaid Other | Admitting: Family Medicine

## 2011-05-24 VITALS — BP 125/80 | Wt 219.0 lb

## 2011-05-24 DIAGNOSIS — Z331 Pregnant state, incidental: Secondary | ICD-10-CM

## 2011-05-24 NOTE — Assessment & Plan Note (Signed)
Doing well. Please see vital and notes section for more information.

## 2011-05-24 NOTE — Patient Instructions (Signed)
Take the Procardia (Nifedipine) if you have contractions 5 or more times in an hour.  You can take this every 6 hours.  Come back and see me in 2 weeks.   If you have any vaginal bleeding, discharge, loss of fluid, or contractions come back or go to the MAU.

## 2011-05-24 NOTE — Progress Notes (Signed)
21 year old G2 P0 010 at 33.[redacted] weeks gestational age today. Since last seen me she was seen in in a few for contractions. These were category one contractions and that she was preterm labor she was started on Procardia. This all occurred last week. She also received 2 doses of betamethasone. Since being seen she has actually not had any further problems with contractions, just an episode of 3 contractions last night. She has not yet even had to pick up the medicine. I did recommend that she go ahead and pick up the Procardia today and gave her instructions on how to use this. Otherwise she is doing well without complaints. No bleeding, loss of fluid, vaginal discharge, worsening edema, or other contractions except as above. Followup with me in 2 weeks.

## 2011-06-03 ENCOUNTER — Inpatient Hospital Stay (HOSPITAL_COMMUNITY)
Admission: AD | Admit: 2011-06-03 | Discharge: 2011-06-03 | Disposition: A | Discharge status: Home / Self Care | Payer: Medicaid Other | Source: Ambulatory Visit | Attending: Obstetrics & Gynecology | Admitting: Obstetrics & Gynecology

## 2011-06-03 ENCOUNTER — Encounter (HOSPITAL_COMMUNITY): Payer: Self-pay | Admitting: *Deleted

## 2011-06-03 DIAGNOSIS — N390 Urinary tract infection, site not specified: Secondary | ICD-10-CM

## 2011-06-03 DIAGNOSIS — N760 Acute vaginitis: Secondary | ICD-10-CM

## 2011-06-03 DIAGNOSIS — O4700 False labor before 37 completed weeks of gestation, unspecified trimester: Secondary | ICD-10-CM | POA: Insufficient documentation

## 2011-06-03 DIAGNOSIS — O2390 Unspecified genitourinary tract infection in pregnancy, unspecified trimester: Secondary | ICD-10-CM | POA: Insufficient documentation

## 2011-06-03 DIAGNOSIS — B9689 Other specified bacterial agents as the cause of diseases classified elsewhere: Secondary | ICD-10-CM | POA: Insufficient documentation

## 2011-06-03 DIAGNOSIS — A499 Bacterial infection, unspecified: Secondary | ICD-10-CM | POA: Insufficient documentation

## 2011-06-03 LAB — URINALYSIS, ROUTINE W REFLEX MICROSCOPIC
Glucose, UA: NEGATIVE mg/dL
Ketones, ur: NEGATIVE mg/dL
Nitrite: NEGATIVE
Specific Gravity, Urine: 1.02 (ref 1.005–1.030)
pH: 6.5 (ref 5.0–8.0)

## 2011-06-03 LAB — URINE MICROSCOPIC-ADD ON

## 2011-06-03 LAB — WET PREP, GENITAL

## 2011-06-03 MED ORDER — NITROFURANTOIN MONOHYD MACRO 100 MG PO CAPS: 100.0000 mg | ORAL_CAPSULE | Freq: Two times a day (BID) | ORAL | Status: AC

## 2011-06-03 MED ORDER — METRONIDAZOLE 500 MG PO TABS: 500.0000 mg | ORAL_TABLET | Freq: Two times a day (BID) | ORAL | Status: AC

## 2011-06-03 NOTE — Progress Notes (Signed)
Pt states U/C's have progressively gotten stronger today at work.  Contractions every 5-6 minutes x 2 hours.  No vaginal bleeding or ROM.  Pt C/O whitish discharge from vagina that itches.

## 2011-06-03 NOTE — Discharge Instructions (Signed)
Preterm Labor Preterm labor is when labor starts at less than 37 weeks of pregnancy. The normal length of a pregnancy is 39 to 41 weeks. CAUSES Often, there is no identifiable underlying cause as to why a woman goes into preterm labor. However, one of the most common known causes of preterm labor is infection. Infections of the uterus, cervix, vagina, amniotic sac, bladder, kidney, or even the lungs (pneumonia) can cause labor to start. Other causes of preterm labor include:  Urogenital infections, such as yeast infections and bacterial vaginosis.   Uterine abnormalities (uterine shape, uterine septum, fibroids, bleeding from the placenta).   A cervix that has been operated on and opens prematurely.   Malformations in the baby.   Multiple gestations (twins, triplets, and so on).   Breakage of the amniotic sac.  Additional risk factors for preterm labor include:  Previous history of preterm labor.   Premature rupture of membranes (PROM).   A placenta that covers the opening of the cervix (placenta previa).   A placenta that separates from the uterus (placenta abruption).   A cervix that is too weak to hold the baby in the uterus (incompetence cervix).   Having too much fluid in the amniotic sac (polyhydramnios).   Taking illegal drugs or smoking while pregnant.   Not gaining enough weight while pregnant.   Women younger than 44 and older than 20 years old.   Low socioeconomic status.   African-American ethnicity.  SYMPTOMS Signs and symptoms of preterm labor include:  Menstrual-like cramps.   Contractions that are 30 to 70 seconds apart, become very regular, closer together, and are more intense and painful.   Contractions that start on the top of the uterus and spread down to the lower abdomen and back.   A sense of increased pelvic pressure or back pain.   A watery or bloody discharge that comes from the vagina.  DIAGNOSIS  A diagnosis can be confirmed by:  A  vaginal exam.   An ultrasound of the cervix.   Sampling (swabbing) cervico-vaginal secretions. These samples can be tested for the presence of fetal fibronectin. This is a protein found in cervical discharge which is associated with preterm labor.   Fetal monitoring.  TREATMENT  Depending on the length of the pregnancy and other circumstances, a caregiver may suggest bed rest. If necessary, there are medicines that can be given to stop contractions and to quicken fetal lung maturity. If labor happens before 34 weeks of pregnancy, a prolonged hospital stay may be recommended. Treatment depends on the condition of both the mother and baby. PREVENTION There are some things a mother can do to lower the risk of preterm labor in future pregnancies. A woman can:   Stop smoking.   Maintain healthy weight gain and avoid chemicals and drugs that are not necessary.   Be watchful for any type of infection.   Inform her caregiver if she has a known history of preterm labor.  Document Released: 06/25/2003 Document Revised: 12/15/2010 Document Reviewed: 07/30/2010 [facility] Patient Information 2012 [place], [place].Bacterial Vaginosis Bacterial vaginosis (BV) is a vaginal infection where the normal balance of bacteria in the vagina is disrupted. The normal balance is then replaced by an overgrowth of certain bacteria. There are several different kinds of bacteria that can cause BV. BV is the most common vaginal infection in women of childbearing age. CAUSES   The cause of BV is not fully understood. BV develops when there is an increase or imbalance of  harmful bacteria.   Some activities or behaviors can upset the normal balance of bacteria in the vagina and put women at increased risk including:   Having a new sex partner or multiple sex partners.   Douching.   Using an intrauterine device (IUD) for contraception.   It is not clear what role sexual activity plays in the development of BV. However,  women that have never had sexual intercourse are rarely infected with BV.  Women do not get BV from toilet seats, bedding, swimming pools or from touching objects around them.  SYMPTOMS   Grey vaginal discharge.   A fish-like odor with discharge, especially after sexual intercourse.   Itching or burning of the vagina and vulva.   Burning or pain with urination.   Some women have no signs or symptoms at all.  DIAGNOSIS  Your caregiver must examine the vagina for signs of BV. Your caregiver will perform lab tests and look at the sample of vaginal fluid through a microscope. They will look for bacteria and abnormal cells (clue cells), a pH test higher than 4.5, and a positive amine test all associated with BV.  RISKS AND COMPLICATIONS   Pelvic inflammatory disease (PID).   Infections following gynecology surgery.   Developing HIV.   Developing herpes virus.  TREATMENT  Sometimes BV will clear up without treatment. However, all women with symptoms of BV should be treated to avoid complications, especially if gynecology surgery is planned. Female partners generally do not need to be treated. However, BV may spread between female sex partners so treatment is helpful in preventing a recurrence of BV.   BV may be treated with antibiotics. The antibiotics come in either pill or vaginal cream forms. Either can be used with nonpregnant or pregnant women, but the recommended dosages differ. These antibiotics are not harmful to the baby.   BV can recur after treatment. If this happens, a second round of antibiotics will often be prescribed.   Treatment is important for pregnant women. If not treated, BV can cause a premature delivery, especially for a pregnant woman who had a premature birth in the past. All pregnant women who have symptoms of BV should be checked and treated.   For chronic reoccurrence of BV, treatment with a type of prescribed gel vaginally twice a week is helpful.  HOME CARE  INSTRUCTIONS   Finish all medication as directed by your caregiver.   Do not have sex until treatment is completed.   Tell your sexual partner that you have a vaginal infection. They should see their caregiver and be treated if they have problems, such as a mild rash or itching.   Practice safe sex. Use condoms. Only have 1 sex partner.  PREVENTION  Basic prevention steps can help reduce the risk of upsetting the natural balance of bacteria in the vagina and developing BV:  Do not have sexual intercourse (be abstinent).   Do not douche.   Use all of the medicine prescribed for treatment of BV, even if the signs and symptoms go away.   Tell your sex partner if you have BV. That way, they can be treated, if needed, to prevent reoccurrence.  SEEK MEDICAL CARE IF:   Your symptoms are not improving after 3 days of treatment.   You have increased discharge, pain, or fever.  MAKE SURE YOU:   Understand these instructions.   Will watch your condition.   Will get help right away if you are not doing well  or get worse.  FOR MORE INFORMATION  Division of STD Prevention (DSTDP), Centers for Disease Control and Prevention: [employer] American Social Health Association (ASHA): www.ashastd.org  Document Released: 04/04/2005 Document Revised: 12/15/2010 Document Reviewed: 09/25/2008 [facility] Patient Information 2012 [place], [place].

## 2011-06-03 NOTE — ED Provider Notes (Addendum)
History     Chief Complaint  Patient presents with  . Labor Eval   HPI  Pt states U/C's have progressively gotten stronger today at work. Contractions every 5-6 minutes x 2 hours. No vaginal bleeding or ROM. Pt C/O whitish discharge from vagina that itches.     Past Medical History  Diagnosis Date  . Asthma   . Chlamydia 2012  . GERD (gastroesophageal reflux disease)   . Acne     Past Surgical History  Procedure Date  . Pregnancy termination     Family History  Problem Relation Age of Onset  . Anesthesia problems Neg Hx     History  Substance Use Topics  . Smoking status: Former Smoker -- 0.2 packs/day    Quit date: 03/03/2011  . Smokeless tobacco: Never Used  . Alcohol Use: No    Allergies: No Known Allergies  Prescriptions prior to admission  Medication Sig Dispense Refill  . albuterol (VENTOLIN HFA) 108 (90 BASE) MCG/ACT inhaler Inhale 2 puffs into the lungs every 4 (four) hours as needed.  1 Inhaler  0  . Prenatal Vit-Fe Fumarate-FA (PRENATAL MULTIVITAMIN) 60-1 MG tablet Take 1 tablet by mouth daily.  30 tablet  11  . ranitidine (ZANTAC) 150 MG tablet Take 1 tablet (150 mg total) by mouth 2 (two) times daily.  31 tablet  3  . triamcinolone (KENALOG) 0.1 % ointment Apply topically 2 (two) times daily as needed. Apply to eczema. 80 GM tube  80 g  1  . Clindamycin-Benzoyl Per, Refr, gel Apply 1 drop topically 2 (two) times daily.  45 g  0    Review of Systems  Gastrointestinal: Positive for abdominal pain (contractions).  Genitourinary:       White vaginal dc/ + itching  All other systems reviewed and are negative.   Physical Exam   Blood pressure 119/74, pulse 88, temperature 99.2 F (37.3 C), temperature source Oral, resp. rate 20, height 5\' 3"  (1.6 m), weight 102.513 kg (226 lb), last menstrual period 11/02/2010, SpO2 99.00%.  Physical Exam  Constitutional: She is oriented to person, place, and time. She appears well-developed and well-nourished. No  distress.  HENT:  Head: Normocephalic.  Neck: Normal range of motion. Neck supple.  Cardiovascular: Normal rate, regular rhythm and normal heart sounds.   Respiratory: Effort normal and breath sounds normal.  GI: Soft. There is no tenderness.  Genitourinary: No bleeding around the vagina. Vaginal discharge (mucusy) found.  Neurological: She is alert and oriented to person, place, and time.  Skin: Skin is warm and dry.  cervix 1/50/-3  MAU Course  Procedures    Assessment and Plan    [facility] 06/03/2011, 3:55 PM   Assumed care of patient Pt not having contractions on toco.  FHT is reactive and reassuring. Cervical exam is loose 1/50/-3 very posterior. PTL precautions reviewed.  Pt verbalized understanding. Follow up with Dr. Gwendolyn Grant at [facility] for regularly scheduled visit.   Results for orders placed during the hospital encounter of 06/03/11 (from the past 24 hour(s))  WET PREP, GENITAL     Status: Abnormal   Collection Time   06/03/11  3:12 PM      Component Value Range   Yeast Wet Prep HPF POC NONE SEEN  NONE SEEN    Trich, Wet Prep NONE SEEN  NONE SEEN    Clue Cells Wet Prep HPF POC MODERATE (*) NONE SEEN    WBC, Wet Prep HPF POC FEW (*) NONE SEEN   URINALYSIS,  ROUTINE W REFLEX MICROSCOPIC     Status: Abnormal   Collection Time   06/03/11  3:12 PM      Component Value Range   Color, Urine YELLOW  YELLOW    APPearance HAZY (*) CLEAR    Specific Gravity, Urine 1.020  1.005 - 1.030    pH 6.5  5.0 - 8.0    Glucose, UA NEGATIVE  NEGATIVE (mg/dL)   Hgb urine dipstick NEGATIVE  NEGATIVE    Bilirubin Urine NEGATIVE  NEGATIVE    Ketones, ur NEGATIVE  NEGATIVE (mg/dL)   Protein, ur NEGATIVE  NEGATIVE (mg/dL)   Urobilinogen, UA 0.2  0.0 - 1.0 (mg/dL)   Nitrite NEGATIVE  NEGATIVE    Leukocytes, UA MODERATE (*) NEGATIVE   URINE MICROSCOPIC-ADD ON     Status: Abnormal   Collection Time   06/03/11  3:12 PM      Component Value Range   Squamous Epithelial / LPF MANY (*)  RARE    WBC, UA 3-6  <3 (WBC/hpf)   Bacteria, UA MANY (*) RARE    DX: Bacterial VAginosis UTI  Urine to culture RX Flagyl Macrobid Keep scheduled appointment. [facility]

## 2011-06-05 NOTE — ED Provider Notes (Signed)
Medical Screening exam and patient care preformed by advanced practice provider.  Agree with the above management.  

## 2011-06-07 ENCOUNTER — Ambulatory Visit (INDEPENDENT_AMBULATORY_CARE_PROVIDER_SITE_OTHER): Payer: Medicaid Other | Admitting: Family Medicine

## 2011-06-07 ENCOUNTER — Encounter: Payer: Medicaid Other | Admitting: Family Medicine

## 2011-06-07 DIAGNOSIS — M79609 Pain in unspecified limb: Secondary | ICD-10-CM

## 2011-06-07 DIAGNOSIS — M79646 Pain in unspecified finger(s): Secondary | ICD-10-CM

## 2011-06-07 DIAGNOSIS — Z331 Pregnant state, incidental: Secondary | ICD-10-CM

## 2011-06-07 NOTE — Patient Instructions (Signed)
Baby & Me  Ongoing Event  See event details for scheduling information. [facility]  [place], [place]   Discuss newborn and infant parentlng along with family adjustment issues with other new mothers. Fees & Payment One time fee of $15 to be paid at the class  Description  Schedule & Location  Related Events  Description  Discuss newborn and infant parentlng along with family adjustment issues with other new mothers. Each week is a new speaker or baby-centered activity. Led by a Designer, jewellery. This class meets every Thursday at 11:00am Contact Childbirth Education at [phone] or [phone]

## 2011-06-08 DIAGNOSIS — M79646 Pain in unspecified finger(s): Secondary | ICD-10-CM | POA: Insufficient documentation

## 2011-06-08 NOTE — Assessment & Plan Note (Signed)
Patient "jammed" her finger at home against her kitchen counter. This was 2 weeks ago. Swelling is gradually improving. She is taking Tylenol intermittently for relief. She is also using ice with relief. PE:  Left 4th digit with minimal swelling distal to DIP joint.  Not tender to palpation except along nail bed.  No erythema or edema of DIP joint itself.  No swelling proximal to joint.  Patient able to flex finger and DIP/PIP joint.   A/P:  Buddy tape finger while at work to protect it.  Continue Tylenol for pain relief. Do not think she has any difficulty with ruptured tendon as she is able to flex the IP joint and this is isolated. Also able to flex PIP joint. Also do not think she has any phalangeal fracture due to minimal pain and swelling on today's exam

## 2011-06-08 NOTE — Progress Notes (Signed)
21 year old G2 P0 010 at 35.[redacted] weeks gestational age today. Still with some irregular contractions. No more than 1-2 in our she feels the Lexapro about 5 minutes at a time. She's taking her Procardia daily. No complaints except for finger pain please see problem list. No bleeding, loss of fluid, vaginal discharge, worsening edema, other contractions except as above. She is 35 weeks today. I do not want to have to repeat GBS and therefore we will hold off on this until her next visit next week. Also at next visit we will do bedside ultrasound to ensure that baby is in vertex position. Baby name:  Messiah Bottle feeding Contraception: Undecided

## 2011-06-09 ENCOUNTER — Telehealth: Payer: Self-pay | Admitting: Family Medicine

## 2011-06-09 NOTE — Telephone Encounter (Signed)
Patient is calling because she laid out of work to day because she didn't feel well, but cannot go back tomorrow unless she has an MD note.  She would like a call when the note is ready.

## 2011-06-10 ENCOUNTER — Encounter: Payer: Self-pay | Admitting: Family Medicine

## 2011-06-10 NOTE — Telephone Encounter (Signed)
LVM to inform patient that I set the letter up front for her to pick up

## 2011-06-24 ENCOUNTER — Other Ambulatory Visit (HOSPITAL_COMMUNITY)
Admission: RE | Admit: 2011-06-24 | Discharge: 2011-06-24 | Disposition: A | Discharge status: Home / Self Care | Payer: Medicaid Other | Source: Ambulatory Visit | Attending: Family Medicine | Admitting: Family Medicine

## 2011-06-24 ENCOUNTER — Ambulatory Visit (INDEPENDENT_AMBULATORY_CARE_PROVIDER_SITE_OTHER): Payer: Medicaid Other | Admitting: Family Medicine

## 2011-06-24 VITALS — BP 128/72 | Temp 97.9°F | Wt 228.6 lb

## 2011-06-24 DIAGNOSIS — Z331 Pregnant state, incidental: Secondary | ICD-10-CM

## 2011-06-24 DIAGNOSIS — B49 Unspecified mycosis: Secondary | ICD-10-CM

## 2011-06-24 DIAGNOSIS — Z113 Encounter for screening for infections with a predominantly sexual mode of transmission: Secondary | ICD-10-CM | POA: Insufficient documentation

## 2011-06-24 DIAGNOSIS — B379 Candidiasis, unspecified: Secondary | ICD-10-CM

## 2011-06-24 LAB — STREP B DNA PROBE: GBS: POSITIVE

## 2011-06-24 MED ORDER — MICONAZOLE NITRATE 2 % EX CREA: TOPICAL_CREAM | Freq: Two times a day (BID) | CUTANEOUS | Status: DC

## 2011-06-24 NOTE — Progress Notes (Signed)
21 year old G2 P0 010 at 11.[redacted] weeks gestational age today. Still with some irregular contractions. No more than 1-2 in hour She's taking her Procardia daily. No bleeding, loss of fluid, vaginal discharge, worsening edema, other contractions except as above. GBS, GC/Chlamydia obtained today  Bedside US next week, did not have time in clinic today.  Baby name:  Messiah Bottle feeding Contraception: Undecided

## 2011-06-24 NOTE — Assessment & Plan Note (Signed)
Treat with miconazole.  Treat based on symptoms/physical exam.

## 2011-06-24 NOTE — Patient Instructions (Signed)
  Come back and see me in 1 weeks.  We should have the ultrasound machine at that time. If you have any vaginal bleeding, discharge, loss of fluid, or contractions come back or go to the MAU.

## 2011-07-01 ENCOUNTER — Ambulatory Visit (INDEPENDENT_AMBULATORY_CARE_PROVIDER_SITE_OTHER): Payer: Medicaid Other | Admitting: Family Medicine

## 2011-07-01 DIAGNOSIS — Z331 Pregnant state, incidental: Secondary | ICD-10-CM

## 2011-07-01 NOTE — Patient Instructions (Signed)
Come back and see me in 1 week. If you have any vaginal bleeding, discharge, loss of fluid, or contractions come back or go to the MAU.    Pregnancy - Third Trimester The third trimester of pregnancy (the last 3 months) is a period of the most rapid growth for you and your baby. The baby approaches a length of 20 inches and a weight of 6 to 10 pounds. The baby is adding on fat and getting ready for life outside your body. While inside, babies have periods of sleeping and waking, suck their thumbs, and hiccups. You can often feel small contractions of the uterus. This is false labor. It is also called Poehler-Hicks contractions. This is like a practice for labor. The usual problems in this stage of pregnancy include more difficulty breathing, swelling of the hands and feet from water retention, and having to urinate more often because of the uterus and baby pressing on your bladder.  PRENATAL EXAMS  Blood work may continue to be done during prenatal exams. These tests are done to check on your health and the probable health of your baby. Blood work is used to follow your blood levels (hemoglobin). Anemia (low hemoglobin) is common during pregnancy. Iron and vitamins are given to help prevent this. You may also continue to be checked for diabetes. Some of the past blood tests may be done again.   The size of the uterus is measured during each visit. This makes sure your baby is growing properly according to your pregnancy dates.   Your blood pressure is checked every prenatal visit. This is to make sure you are not getting toxemia.   Your urine is checked every prenatal visit for infection, diabetes and protein.   Your weight is checked at each visit. This is done to make sure gains are happening at the suggested rate and that you and your baby are growing normally.   Sometimes, an ultrasound is performed to confirm the position and the proper growth and development of the baby. This is a test done  that bounces harmless sound waves off the baby so your caregiver can more accurately determine due dates.   Discuss the type of pain medication and anesthesia you will have during your labor and delivery.   Discuss the possibility and anesthesia if a Cesarean Section might be necessary.   Inform your caregiver if there is any mental or physical violence at home.  Sometimes, a specialized non-stress test, contraction stress test and biophysical profile are done to make sure the baby is not having a problem. Checking the amniotic fluid surrounding the baby is called an amniocentesis. The amniotic fluid is removed by sticking a needle into the belly (abdomen). This is sometimes done near the end of pregnancy if an early delivery is required. In this case, it is done to help make sure the baby's lungs are mature enough for the baby to live outside of the womb. If the lungs are not mature and it is unsafe to deliver the baby, an injection of cortisone medication is given to the mother 1 to 2 days before the delivery. This helps the baby's lungs mature and makes it safer to deliver the baby. CHANGES OCCURING IN THE THIRD TRIMESTER OF PREGNANCY Your body goes through many changes during pregnancy. They vary from person to person. Talk to your caregiver about changes you notice and are concerned about.  During the last trimester, you have probably had an increase in your appetite. It  is normal to have cravings for certain foods. This varies from person to person and pregnancy to pregnancy.   You may begin to get stretch marks on your hips, abdomen, and breasts. These are normal changes in the body during pregnancy. There are no exercises or medications to take which prevent this change.   Constipation may be treated with a stool softener or adding bulk to your diet. Drinking lots of fluids, fiber in vegetables, fruits, and whole grains are helpful.   Exercising is also helpful. If you have been very active  up until your pregnancy, most of these activities can be continued during your pregnancy. If you have been less active, it is helpful to start an exercise program such as walking. Consult your caregiver before starting exercise programs.   Avoid all smoking, alcohol, un-prescribed drugs, herbs and "street drugs" during your pregnancy. These chemicals affect the formation and growth of the baby. Avoid chemicals throughout the pregnancy to ensure the delivery of a healthy infant.   Backache, varicose veins and hemorrhoids may develop or get worse.   You will tire more easily in the third trimester, which is normal.   The baby's movements may be stronger and more often.   You may become short of breath easily.   Your belly button may stick out.   A yellow discharge may leak from your breasts called colostrum.   You may have a bloody mucus discharge. This usually occurs a few days to a week before labor begins.  HOME CARE INSTRUCTIONS   Keep your caregiver's appointments. Follow your caregiver's instructions regarding medication use, exercise, and diet.   During pregnancy, you are providing food for you and your baby. Continue to eat regular, well-balanced meals. Choose foods such as meat, fish, milk and other low fat dairy products, vegetables, fruits, and whole-grain breads and cereals. Your caregiver will tell you of the ideal weight gain.   A physical sexual relationship may be continued throughout pregnancy if there are no other problems such as early (premature) leaking of amniotic fluid from the membranes, vaginal bleeding, or belly (abdominal) pain.   Exercise regularly if there are no restrictions. Check with your caregiver if you are unsure of the safety of your exercises. Greater weight gain will occur in the last 2 trimesters of pregnancy. Exercising helps:   Control your weight.   Get you in shape for labor and delivery.   You lose weight after you deliver.   Rest a lot with  legs elevated, or as needed for leg cramps or low back pain.   Wear a good support or jogging bra for breast tenderness during pregnancy. This may help if worn during sleep. Pads or tissues may be used in the bra if you are leaking colostrum.   Do not use hot tubs, steam rooms, or saunas.   Wear your seat belt when driving. This protects you and your baby if you are in an accident.   Avoid raw meat, cat litter boxes and soil used by cats. These carry germs that can cause birth defects in the baby.   It is easier to loose urine during pregnancy. Tightening up and strengthening the pelvic muscles will help with this problem. You can practice stopping your urination while you are going to the bathroom. These are the same muscles you need to strengthen. It is also the muscles you would use if you were trying to stop from passing gas. You can practice tightening these muscles up 10  times a set and repeating this about 3 times per day. Once you know what muscles to tighten up, do not perform these exercises during urination. It is more likely to cause an infection by backing up the urine.   Ask for help if you have financial, counseling or nutritional needs during pregnancy. Your caregiver will be able to offer counseling for these needs as well as refer you for other special needs.   Make a list of emergency phone numbers and have them available.   Plan on getting help from family or friends when you go home from the hospital.   Make a trial run to the hospital.   Take prenatal classes with the father to understand, practice and ask questions about the labor and delivery.   Prepare the baby's room/nursery.   Do not travel out of the city unless it is absolutely necessary and with the advice of your caregiver.   Wear only low or no heal shoes to have better balance and prevent falling.  MEDICATIONS AND DRUG USE IN PREGNANCY  Take prenatal vitamins as directed. The vitamin should contain 1  milligram of folic acid. Keep all vitamins out of reach of children. Only a couple vitamins or tablets containing iron may be fatal to a baby or young child when ingested.   Avoid use of all medications, including herbs, over-the-counter medications, not prescribed or suggested by your caregiver. Only take over-the-counter or prescription medicines for pain, discomfort, or fever as directed by your caregiver. Do not use aspirin, ibuprofen (Motrin, Advil, Nuprin) or naproxen (Aleve) unless OK'd by your caregiver.   Let your caregiver also know about herbs you may be using.   Alcohol is related to a number of birth defects. This includes fetal alcohol syndrome. All alcohol, in any form, should be avoided completely. Smoking will cause low birth rate and premature babies.   Street/illegal drugs are very harmful to the baby. They are absolutely forbidden. A baby born to an addicted mother will be addicted at birth. The baby will go through the same withdrawal an adult does.  SEEK MEDICAL CARE IF: You have any concerns or worries during your pregnancy. It is better to call with your questions if you feel they cannot wait, rather than worry about them. DECISIONS ABOUT CIRCUMCISION You may or may not know the sex of your baby. If you know your baby is a boy, it may be time to think about circumcision. Circumcision is the removal of the foreskin of the penis. This is the skin that covers the sensitive end of the penis. There is no proven medical need for this. Often this decision is made on what is popular at the time or based upon religious beliefs and social issues. You can discuss these issues with your caregiver or pediatrician. SEEK IMMEDIATE MEDICAL CARE IF:   An unexplained oral temperature above 102 F (38.9 C) develops, or as your caregiver suggests.   You have leaking of fluid from the vagina (birth canal). If leaking membranes are suspected, take your temperature and tell your caregiver of  this when you call.   There is vaginal spotting, bleeding or passing clots. Tell your caregiver of the amount and how many pads are used.   You develop a bad smelling vaginal discharge with a change in the color from clear to white.   You develop vomiting that lasts more than 24 hours.   You develop chills or fever.   You develop shortness of breath.  You develop burning on urination.   You loose more than 2 pounds of weight or gain more than 2 pounds of weight or as suggested by your caregiver.   You notice sudden swelling of your face, hands, and feet or legs.   You develop belly (abdominal) pain. Round ligament discomfort is a common non-cancerous (benign) cause of abdominal pain in pregnancy. Your caregiver still must evaluate you.   You develop a severe headache that does not go away.   You develop visual problems, blurred or double vision.   If you have not felt your baby move for more than 1 hour. If you think the baby is not moving as much as usual, eat something with sugar in it and lie down on your left side for an hour. The baby should move at least 4 to 5 times per hour. Call right away if your baby moves less than that.   You fall, are in a car accident or any kind of trauma.   There is mental or physical violence at home.  Document Released: 03/29/2001 Document Revised: 03/24/2011 Document Reviewed: 10/01/2008 [facility] Patient Information 2012 [place], [place].

## 2011-07-04 NOTE — Progress Notes (Signed)
21 year old G2 P0 010 at 1.[redacted] weeks gestational age today. Still with some irregular contractions. No more than 1-2 in hour She is no longer taking Procardia. She states she has not really noticed any change in her contractions and she stopped this about one week ago. Perform bedside ultrasound today which did show vertex positioning. She did endorse some vaginal fluid and therefore we performed ferning and nitrazine testing and clinic which was negative. I personally reviewed ferning slide. No bleeding, vaginal discharge, worsening edema, other contractions except as above. Baby name:  Gail Walker Bottle feeding Contraception: Undecided Followup in one week.

## 2011-07-05 ENCOUNTER — Other Ambulatory Visit: Payer: Self-pay | Admitting: Family Medicine

## 2011-07-08 ENCOUNTER — Ambulatory Visit (INDEPENDENT_AMBULATORY_CARE_PROVIDER_SITE_OTHER): Payer: Medicaid Other | Admitting: Family Medicine

## 2011-07-08 VITALS — BP 112/78 | Temp 97.8°F | Wt 232.0 lb

## 2011-07-08 DIAGNOSIS — Z3400 Encounter for supervision of normal first pregnancy, unspecified trimester: Secondary | ICD-10-CM

## 2011-07-08 DIAGNOSIS — O480 Post-term pregnancy: Secondary | ICD-10-CM

## 2011-07-10 ENCOUNTER — Encounter (HOSPITAL_COMMUNITY): Payer: Self-pay | Admitting: Obstetrics and Gynecology

## 2011-07-10 ENCOUNTER — Inpatient Hospital Stay (HOSPITAL_COMMUNITY)
Admission: AD | Admit: 2011-07-10 | Discharge: 2011-07-12 | DRG: 775 | Disposition: A | Discharge status: Home / Self Care | Payer: Medicaid Other | Source: Ambulatory Visit | Attending: Obstetrics & Gynecology | Admitting: Obstetrics & Gynecology

## 2011-07-10 ENCOUNTER — Encounter (HOSPITAL_COMMUNITY): Payer: Self-pay | Admitting: Anesthesiology

## 2011-07-10 ENCOUNTER — Inpatient Hospital Stay (HOSPITAL_COMMUNITY): Payer: Medicaid Other | Admitting: Anesthesiology

## 2011-07-10 DIAGNOSIS — O9989 Other specified diseases and conditions complicating pregnancy, childbirth and the puerperium: Secondary | ICD-10-CM

## 2011-07-10 DIAGNOSIS — O99892 Other specified diseases and conditions complicating childbirth: Secondary | ICD-10-CM | POA: Diagnosis present

## 2011-07-10 DIAGNOSIS — Z22330 Carrier of Group B streptococcus: Secondary | ICD-10-CM

## 2011-07-10 LAB — RPR: RPR Ser Ql: NONREACTIVE

## 2011-07-10 LAB — CBC
HCT: 37.5 % (ref 36.0–46.0)
Hemoglobin: 12.8 g/dL (ref 12.0–15.0)
MCH: 28.3 pg (ref 26.0–34.0)
MCHC: 34.1 g/dL (ref 30.0–36.0)
RBC: 4.52 MIL/uL (ref 3.87–5.11)

## 2011-07-10 MED ORDER — PENICILLIN G POTASSIUM 5000000 UNITS IJ SOLR
2.5000 10*6.[IU] | INTRAVENOUS | Status: DC
  Administered 2011-07-10 (×2): 2.5 10*6.[IU] via INTRAVENOUS
  Filled 2011-07-10 (×4): qty 2.5

## 2011-07-10 MED ORDER — EPHEDRINE 5 MG/ML INJ: 10.0000 mg | INTRAVENOUS | Status: DC | PRN

## 2011-07-10 MED ORDER — OXYCODONE-ACETAMINOPHEN 5-325 MG PO TABS: 1.0000 | ORAL_TABLET | ORAL | Status: DC | PRN

## 2011-07-10 MED ORDER — ONDANSETRON HCL 4 MG/2ML IJ SOLN
4.0000 mg | Freq: Once | INTRAMUSCULAR | Status: AC
  Administered 2011-07-10: 4 mg via INTRAVENOUS

## 2011-07-10 MED ORDER — AMPICILLIN SODIUM 1 G IJ SOLR
1.0000 g | Freq: Four times a day (QID) | INTRAMUSCULAR | Status: DC
  Administered 2011-07-10: 1 g via INTRAVENOUS
  Filled 2011-07-10 (×4): qty 1000

## 2011-07-10 MED ORDER — ONDANSETRON HCL 4 MG/2ML IJ SOLN
4.0000 mg | Freq: Four times a day (QID) | INTRAMUSCULAR | Status: DC | PRN
  Administered 2011-07-10: 4 mg via INTRAVENOUS
  Filled 2011-07-10 (×2): qty 2

## 2011-07-10 MED ORDER — ACETAMINOPHEN 325 MG PO TABS
650.0000 mg | ORAL_TABLET | ORAL | Status: DC | PRN
  Administered 2011-07-10: 650 mg via ORAL
  Filled 2011-07-10: qty 2

## 2011-07-10 MED ORDER — OXYTOCIN 20 UNITS IN LACTATED RINGERS INFUSION - SIMPLE
125.0000 mL/h | Freq: Once | INTRAVENOUS | Status: AC
  Administered 2011-07-11: 999 mL/h via INTRAVENOUS

## 2011-07-10 MED ORDER — IBUPROFEN 600 MG PO TABS: 600.0000 mg | ORAL_TABLET | Freq: Four times a day (QID) | ORAL | Status: DC | PRN

## 2011-07-10 MED ORDER — SODIUM BICARBONATE 8.4 % IV SOLN
INTRAVENOUS | Status: DC | PRN
  Administered 2011-07-10: 4 mL via EPIDURAL

## 2011-07-10 MED ORDER — DIPHENHYDRAMINE HCL 50 MG/ML IJ SOLN: 12.5000 mg | INTRAMUSCULAR | Status: DC | PRN

## 2011-07-10 MED ORDER — OXYTOCIN BOLUS FROM INFUSION
500.0000 mL | Freq: Once | INTRAVENOUS | Status: DC
  Filled 2011-07-10: qty 500
  Filled 2011-07-10: qty 1000

## 2011-07-10 MED ORDER — OXYTOCIN 20 UNITS IN LACTATED RINGERS INFUSION - SIMPLE
1.0000 m[IU]/min | INTRAVENOUS | Status: DC
  Administered 2011-07-10: 10 m[IU]/min via INTRAVENOUS
  Administered 2011-07-10: 2 m[IU]/min via INTRAVENOUS

## 2011-07-10 MED ORDER — PHENYLEPHRINE 40 MCG/ML (10ML) SYRINGE FOR IV PUSH (FOR BLOOD PRESSURE SUPPORT)
80.0000 ug | PREFILLED_SYRINGE | INTRAVENOUS | Status: DC | PRN
  Filled 2011-07-10: qty 5

## 2011-07-10 MED ORDER — PENICILLIN G POTASSIUM 5000000 UNITS IJ SOLR
5.0000 10*6.[IU] | Freq: Once | INTRAVENOUS | Status: AC
  Administered 2011-07-10: 5 10*6.[IU] via INTRAVENOUS
  Filled 2011-07-10: qty 5

## 2011-07-10 MED ORDER — TERBUTALINE SULFATE 1 MG/ML IJ SOLN: 0.2500 mg | Freq: Once | INTRAMUSCULAR | Status: AC | PRN

## 2011-07-10 MED ORDER — FLEET ENEMA 7-19 GM/118ML RE ENEM: 1.0000 | ENEMA | RECTAL | Status: DC | PRN

## 2011-07-10 MED ORDER — PROMETHAZINE HCL 25 MG/ML IJ SOLN: 12.5000 mg | Freq: Once | INTRAMUSCULAR | Status: DC

## 2011-07-10 MED ORDER — NALBUPHINE SYRINGE 5 MG/0.5 ML
10.0000 mg | INJECTION | Freq: Once | INTRAMUSCULAR | Status: AC
  Administered 2011-07-10: 10 mg via INTRAMUSCULAR

## 2011-07-10 MED ORDER — PHENYLEPHRINE 40 MCG/ML (10ML) SYRINGE FOR IV PUSH (FOR BLOOD PRESSURE SUPPORT): 80.0000 ug | PREFILLED_SYRINGE | INTRAVENOUS | Status: DC | PRN

## 2011-07-10 MED ORDER — EPHEDRINE 5 MG/ML INJ
10.0000 mg | INTRAVENOUS | Status: DC | PRN
  Filled 2011-07-10: qty 4

## 2011-07-10 MED ORDER — FENTANYL 2.5 MCG/ML BUPIVACAINE 1/10 % EPIDURAL INFUSION (WH - ANES)
14.0000 mL/h | INTRAMUSCULAR | Status: DC
  Administered 2011-07-10 (×3): 14 mL/h via EPIDURAL
  Filled 2011-07-10 (×4): qty 60

## 2011-07-10 MED ORDER — LACTATED RINGERS IV SOLN
500.0000 mL | INTRAVENOUS | Status: DC | PRN
  Administered 2011-07-10: 500 mL via INTRAVENOUS
  Administered 2011-07-10: 600 mL via INTRAVENOUS

## 2011-07-10 MED ORDER — NALBUPHINE SYRINGE 5 MG/0.5 ML
10.0000 mg | INJECTION | Freq: Once | INTRAMUSCULAR | Status: AC
  Administered 2011-07-10: 10 mg via INTRAVENOUS

## 2011-07-10 MED ORDER — OXYTOCIN 20 UNITS IN LACTATED RINGERS INFUSION - SIMPLE: 1.0000 m[IU]/min | INTRAVENOUS | Status: DC

## 2011-07-10 MED ORDER — HYDROXYZINE HCL 50 MG/ML IM SOLN: 25.0000 mg | Freq: Once | INTRAMUSCULAR | Status: DC

## 2011-07-10 MED ORDER — LACTATED RINGERS IV SOLN: 500.0000 mL | Freq: Once | INTRAVENOUS | Status: DC

## 2011-07-10 MED ORDER — CITRIC ACID-SODIUM CITRATE 334-500 MG/5ML PO SOLN
30.0000 mL | ORAL | Status: DC | PRN
  Administered 2011-07-10 (×3): 30 mL via ORAL
  Filled 2011-07-10 (×3): qty 15

## 2011-07-10 MED ORDER — LACTATED RINGERS IV SOLN
INTRAVENOUS | Status: DC
  Administered 2011-07-10 (×3): via INTRAVENOUS

## 2011-07-10 MED ORDER — NALBUPHINE SYRINGE 5 MG/0.5 ML
INJECTION | INTRAMUSCULAR | Status: AC
  Administered 2011-07-10: 10 mg via INTRAMUSCULAR
  Filled 2011-07-10: qty 2

## 2011-07-10 MED ORDER — FENTANYL 2.5 MCG/ML BUPIVACAINE 1/10 % EPIDURAL INFUSION (WH - ANES)
INTRAMUSCULAR | Status: DC | PRN
  Administered 2011-07-10: 14 mL/h via EPIDURAL

## 2011-07-10 MED ORDER — LIDOCAINE HCL (PF) 1 % IJ SOLN
30.0000 mL | INTRAMUSCULAR | Status: DC | PRN
  Administered 2011-07-11: 30 mL via SUBCUTANEOUS
  Filled 2011-07-10: qty 30

## 2011-07-10 MED ORDER — ALBUTEROL SULFATE HFA 108 (90 BASE) MCG/ACT IN AERS
2.0000 | INHALATION_SPRAY | Freq: Four times a day (QID) | RESPIRATORY_TRACT | Status: DC | PRN
  Filled 2011-07-10: qty 6.7

## 2011-07-10 MED ORDER — GENTAMICIN SULFATE 40 MG/ML IJ SOLN
170.0000 mg | Freq: Three times a day (TID) | INTRAVENOUS | Status: DC
  Administered 2011-07-10: 170 mg via INTRAVENOUS
  Filled 2011-07-10 (×3): qty 4.25

## 2011-07-10 NOTE — Progress Notes (Signed)
**   Late note -- checked on patient at 1820  Patient ID: Pollie Meyer, female   DOB: 05/23/1990, 21 y.o.   MRN: [id number] Patient ID: JAZALYNN MIRELES, female   DOB: 01/20/1991, 21 y.o.   MRN: [id number] KAYLLA COBOS is a 21 y.o. G2P0010 at [redacted]w[redacted]d by LMP admitted for Rupture of membranes and early labor  Subjective: Patient sleeping.  Aroused easily.  No complaints except being cold.    Objective: BP 119/63  Pulse 93  Temp(Src) 98.3 F (36.8 C) (Oral)  Resp 20  Ht 5\' 4"  (1.626 m)  Wt 232 lb (105.235 kg)  BMI 39.82 kg/m2  SpO2 99%  LMP 11/02/2010 I/O last 3 completed shifts: In: 34.5 [I.V.:34.5] Out: -     FHT:  FHR: 140 bpm, variability: moderate,  accelerations:  Present,  decelerations:  Absent UC:   regular, every 3  minutes SVE:   7/100/-1 per nurse check 1 hour before my arrival  Labs: Lab Results  Component Value Date   WBC 8.3 07/10/2011   HGB 12.8 07/10/2011   HCT 37.5 07/10/2011   MCV 83.0 07/10/2011   PLT 190 07/10/2011    Assessment / Plan: Augmentation of labor, progressing well PCN just hung, came from refrigerator, likely reason for patient becoming chilly.  Temp good (98.4, nurse checked while I was in the room.)  Labor: Progressing on pitocin Preeclampsia:  No signs, continue vitals monitoring Fetal Wellbeing:  Category I Pain Control:  Epidural I/D:  PCN for GBS PPX Anticipated MOD:  NSVD  Shaneika Rossa,JEFF 07/10/2011, 7:50 PM

## 2011-07-10 NOTE — Progress Notes (Signed)
Report & pt care given to [facility]

## 2011-07-10 NOTE — Progress Notes (Signed)
Patient ID: Gail Walker, female   DOB: 1990-06-28, 21 y.o.   MRN: [id number] Patient ID: Gail Walker, female   DOB: 15-Sep-1990, 21 y.o.   MRN: [id number] Gail Walker is a 21 y.o. G2P0010 at [redacted]w[redacted]d by LMP admitted for Rupture of membranes and early labor  Subjective: Feeling back pressure.  Otherwise doing well subjectively.   Objective: BP 122/66  Pulse 101  Temp(Src) 100.5 F (38.1 C) (Axillary)  Resp 20  Ht 5\' 4"  (1.626 m)  Wt 232 lb (105.235 kg)  BMI 39.82 kg/m2  SpO2 99%  LMP 11/02/2010 I/O last 3 completed shifts: In: 34.5 [I.V.:34.5] Out: -     FHT:  FHR: 140 bpm, variability: moderate,  accelerations:  Present,  decelerations:  Absent UC:   regular, every 3  minutes SVE:   10/100/-1, does have anterior lip present.   Labs: Lab Results  Component Value Date   WBC 8.3 07/10/2011   HGB 12.8 07/10/2011   HCT 37.5 07/10/2011   MCV 83.0 07/10/2011   PLT 190 07/10/2011    Assessment / Plan: Augmentation of labor, progressing well Patient with fever to 100.5.  Broadening antibiotics to Amp/Gent.   Pitocin at 12 milliunits/hour.   She is complete but has still at -1 station -- I believe she is OP but am having trouble palpating fontanelles secondary to caput.  Cannot palpate ears.    Labor: Progressing on pitocin Preeclampsia:  No signs, continue vitals monitoring Fetal Wellbeing:  Category I Pain Control:  Epidural I/D:  PCN for GBS PPX.  Amp/Gent for broadening coverage.  Anticipated MOD:  NSVD  Suhey Radford,JEFF 07/10/2011, 9:25 PM

## 2011-07-10 NOTE — Anesthesia Preprocedure Evaluation (Signed)
Anesthesia Evaluation  Patient identified by MRN, date of birth, ID band Patient awake    Reviewed: Allergy & Precautions, H&P , Patient's Chart, lab work & pertinent test results  Airway Mallampati: II TM Distance: >3 FB Neck ROM: full    Dental No notable dental hx.    Pulmonary asthma (daily inhaler use, chest clear) ,  breath sounds clear to auscultation  Pulmonary exam normal       Cardiovascular Exercise Tolerance: Good Rhythm:regular Rate:Normal     Neuro/Psych    GI/Hepatic GERD-  ,  Endo/Other  Morbid obesity  Renal/GU      Musculoskeletal   Abdominal   Peds  Hematology   Anesthesia Other Findings   Reproductive/Obstetrics                           Anesthesia Physical Anesthesia Plan  ASA: III  Anesthesia Plan:    Post-op Pain Management:    Induction:   Airway Management Planned:   Additional Equipment:   Intra-op Plan:   Post-operative Plan:   Informed Consent: I have reviewed the patients History and Physical, chart, labs and discussed the procedure including the risks, benefits and alternatives for the proposed anesthesia with the patient or authorized representative who has indicated his/her understanding and acceptance.     Plan Discussed with:   Anesthesia Plan Comments:         Anesthesia Quick Evaluation

## 2011-07-10 NOTE — Progress Notes (Signed)
Instructed to remain in bed at all times with epidural.  Voiced understanding.

## 2011-07-10 NOTE — Anesthesia Procedure Notes (Signed)

## 2011-07-10 NOTE — Consult Note (Signed)
ANTIBIOTIC CONSULT NOTE - INITIAL  Pharmacy Consult for Gentamicin Indication: maternal fever / r/o chorio  No Known Allergies  Patient Measurements: Height: 5\' 4"  (162.6 cm) Weight: 232 lb (105.235 kg) IBW/kg (Calculated) : 54.7  Adjusted Body Weight: 69.8 kg  Vital Signs: Temp: 100.5 F (38.1 C) (03/24 2050) Temp src: Axillary (03/24 2050) BP: 122/66 mmHg (03/24 2102) Pulse Rate: 101  (03/24 2102) Intake/Output from previous day:   Intake/Output from this shift:    Labs:  [facility] 07/10/11 0950  WBC 8.3  HGB 12.8  PLT 190  LABCREA --  CREATININE --   Estimated Creatinine Clearance: 117.9 ml/min (by C-G formula based on Cr of 0.9). No results found for this basename: VANCOTROUGH:2,VANCOPEAK:2,VANCORANDOM:2,GENTTROUGH:2,GENTPEAK:2,GENTRANDOM:2,TOBRATROUGH:2,TOBRAPEAK:2,TOBRARND:2,AMIKACINPEAK:2,AMIKACINTROU:2,AMIKACIN:2, in the last 72 hours   Microbiology: Recent Results (from the past 720 hour(s))  STREP B DNA PROBE     Status: Normal      Component Value Range Status Comment   Group B Strep Ag Positive      STREP B DNA PROBE     Status: Normal   Collection Time   06/24/11 12:33 PM      Component Value Range Status Comment   GBSP POSITIVE   Final     Medical History: Past Medical History  Diagnosis Date  . Chlamydia 2012  . Acne   . Asthma     uses albuterol inhaler daily  . GERD (gastroesophageal reflux disease)     during pregnancy - takes zantac    Medications:  Ampicillin 1g IV q6h  Assessment: Pt is a 21 yo G2P1 at [redacted]w[redacted]d being initiated on ampicillin and gentamicin for maternal fever / r/o chorioamnionitis.   Goal of Therapy:  Gentamicin Peak 6-8 mcg/ml  Gentamicin trough <1 mcg/ml  Plan:  Gentamicin 170mg  IV every 8 hours  Follow up renal function if continued post-partum and levels as appropriate    Emersyn Kotarski [place] 07/10/2011,9:37 PM

## 2011-07-10 NOTE — Progress Notes (Signed)
Patient ID: Gail Walker, female   DOB: [birth year], 21 y.o.   MRN: [id number] Patient ID: Gail Walker, female   DOB: 1990-12-16, 21 y.o.   MRN: [id number] Gail Walker is a 21 y.o. G2P0010 at [redacted]w[redacted]d by LMP admitted for Rupture of membranes and early labor  Subjective: No complaints.  Doing well.    Objective: BP 135/77  Pulse 105  Temp(Src) 98.3 F (36.8 C) (Oral)  Resp 20  Ht 5\' 4"  (1.626 m)  Wt 232 lb (105.235 kg)  BMI 39.82 kg/m2  SpO2 99%  LMP 11/02/2010 I/O last 3 completed shifts: In: 34.5 [I.V.:34.5] Out: -     FHT:  FHR: 140 bpm, variability: moderate,  accelerations:  Present,  decelerations:  Absent UC:   regular, every 3  minutes SVE:   8/100/-1 Labs: Lab Results  Component Value Date   WBC 8.3 07/10/2011   HGB 12.8 07/10/2011   HCT 37.5 07/10/2011   MCV 83.0 07/10/2011   PLT 190 07/10/2011    Assessment / Plan: Augmentation of labor, progressing well Increasing pitocin from 9 to 11 milliunits/hr.    Labor: Progressing on pitocin Preeclampsia:  No signs, continue vitals monitoring Fetal Wellbeing:  Category I Pain Control:  Epidural I/D:  PCN for GBS PPX Anticipated MOD:  NSVD  Ricki Vanhandel,JEFF 07/10/2011, 8:13 PM

## 2011-07-10 NOTE — Progress Notes (Signed)
Asked Marvell Fuller, RN to monitor pt while I eat lunch.

## 2011-07-10 NOTE — Progress Notes (Signed)
Patient ID: Gail Walker, female   DOB: 1991-03-25, 21 y.o.   MRN: [id number] Gail Walker is a 21 y.o. G2P0010 at [redacted]w[redacted]d by LMP admitted for Rupture of membranes and early labor  Subjective: Back pressure continues.  No complaints.  Wants to be checked.    Objective: BP 125/76  Pulse 103  Temp(Src) 100.5 F (38.1 C) (Axillary)  Resp 20  Ht 5\' 4"  (1.626 m)  Wt 232 lb (105.235 kg)  BMI 39.82 kg/m2  SpO2 99%  LMP 11/02/2010 I/O last 3 completed shifts: In: 34.5 [I.V.:34.5] Out: -     FHT:  FHR: 140 bpm, variability: moderate,  accelerations:  Present,  decelerations:  Variable decels. UC:   regular, every 2-3  minutes SVE:   10/100/+1  Labs: Lab Results  Component Value Date   WBC 8.3 07/10/2011   HGB 12.8 07/10/2011   HCT 37.5 07/10/2011   MCV 83.0 07/10/2011   PLT 190 07/10/2011    Assessment / Plan: Augmentation of labor, progressing well.  Tried pushing several times in room.  Will continue to labor down.  Baby looks good.  Pitocin at 12 milliunits/hour.    Labor: Progressing on pitocin Preeclampsia:  No signs, continue vitals monitoring Fetal Wellbeing:  Category I Pain Control:  Epidural I/D:  PCN for GBS PPX.  Amp/Gent for broadening coverage as febrile.  Current temp with me in room was 98.5.  No further Tylenol has been required since 9:30.  .  Anticipated MOD:  NSVD  Joven Mom,JEFF 07/10/2011, 11:39 PM

## 2011-07-10 NOTE — MAU Note (Signed)
Pt presents to MAU with chief complaint of contractions and possible ROM. Pt is [redacted]w[redacted]d; states contractions started 1 hour go. Pt is a G2P0

## 2011-07-10 NOTE — Progress Notes (Signed)
Patient ID: Gail Walker, female   DOB: 07-01-1990, 21 y.o.   MRN: [id number] Gail Walker is a 21 y.o. G2P0010 at [redacted]w[redacted]d by LMP admitted for rupture of membranes, and early labor  Subjective:   Objective: BP 133/85  Pulse 88  Temp(Src) 98.4 F (36.9 C) (Oral)  Resp 20  Ht 5\' 4"  (1.626 m)  Wt 232 lb (105.235 kg)  BMI 39.82 kg/m2  SpO2 100%  LMP 11/02/2010      FHT:  FHR: 135 bpm, variability: moderate,  accelerations:  Present,  decelerations:  Absent UC:   regular, every 2-3 minutes SVE:   Dilation: 3.5 Effacement (%): 90 Station: -2;-3 Exam by:: Jodi Geralds, RNC  Labs: Lab Results  Component Value Date   WBC 8.3 07/10/2011   HGB 12.8 07/10/2011   HCT 37.5 07/10/2011   MCV 83.0 07/10/2011   PLT 190 07/10/2011    Assessment / Plan: Ruptured membanes >12 hours, early labor, augmentation with pitocin  Labor: Progressing on Pitocin, will continue to increase then AROM Preeclampsia:  No signs Fetal Wellbeing:  Category I Pain Control:  Epidural I/D:  Penicillin for GBS PPX.  Anticipated MOD:  NSVD  Dr. Gwendolyn Grant aware patient admitted, plans to come for delivery.   Caisen Mangas 07/10/2011, 12:11 PM

## 2011-07-10 NOTE — H&P (Signed)
Attestation of Attending Supervision of Resident: Evaluation and management procedures were performed by the [facility] Resident under my supervision.  I have evaluated the patient, reviewed the resident's note and chart, and I agree with management and plan.  Jaynie Collins, M.D. 07/10/2011 10:38 AM

## 2011-07-10 NOTE — Progress Notes (Signed)
Patient ID: Gail Walker, female   DOB: 20-Aug-1990, 20 y.o.   MRN: [id number] Gail Walker is a 21 y.o. G2P0010 at [redacted]w[redacted]d by LMP admitted for Rupture of membranes and early labor  Subjective:   Objective: BP 138/82  Pulse 113  Temp(Src) 98.3 F (36.8 C) (Oral)  Resp 20  Ht 5\' 4"  (1.626 m)  Wt 232 lb (105.235 kg)  BMI 39.82 kg/m2  SpO2 100%  LMP 11/02/2010   Total I/O In: 34.5 [I.V.:34.5] Out: -   FHT:  FHR: 140 bpm, variability: moderate,  accelerations:  Present,  decelerations:  Absent UC:   regular, every 3  minutes SVE:   5/100/-2 Labs: Lab Results  Component Value Date   WBC 8.3 07/10/2011   HGB 12.8 07/10/2011   HCT 37.5 07/10/2011   MCV 83.0 07/10/2011   PLT 190 07/10/2011    Assessment / Plan: Augmentation of labor, progressing well  Labor: Progressing on pitocin Preeclampsia:  No signs, continue vitals monitoring Fetal Wellbeing:  Category I Pain Control:  Epidural I/D:  PCN for GBS PPX Anticipated MOD:  NSVD  Abiha Lukehart 07/10/2011, 2:40 PM

## 2011-07-10 NOTE — Progress Notes (Signed)
Resumed pt care

## 2011-07-10 NOTE — Progress Notes (Signed)
21 yo G2P 0010 at 39.4 weeks today.  Doing well.  Ready to have baby. Long discussion with her as I will be out of town week after Easter, which would be 41 weeks for her at that time. Plan for her to see Dr. Tye Savoy next week, who will follow her if induction necessary.  This way she will have a familiar face at time of delivery. Of course if she delivers before induction day before had a town Easter weekend I will plan to deliver her. Prenatal warnings provided. We went ahead and set her up for NST BPP today as she would not be able to see Dr. Sondra Come until Wednesday of next week.

## 2011-07-10 NOTE — H&P (Signed)
Gail Walker is a 21 y.o. female presenting for Rupture of membranes last night and contractions. Maternal Medical History:  Reason for admission: Reason for admission: rupture of membranes and contractions.  Contractions: Onset was 3-5 hours ago.   Frequency: regular.   Perceived severity is strong.    Fetal activity: Perceived fetal activity is normal.   Last perceived fetal movement was within the past hour.      OB History    Grav Para Term Preterm Abortions TAB SAB Ect Mult Living   2 0 0 0 1 1 0 0 0 0      Past Medical History  Diagnosis Date  . Asthma   . Chlamydia 2012  . GERD (gastroesophageal reflux disease)   . Acne    Past Surgical History  Procedure Date  . Pregnancy termination   . No past surgeries    Family History: family history is negative for Anesthesia problems. Social History:  reports that she quit smoking about 4 months ago. She has never used smokeless tobacco. She reports that she does not drink alcohol or use illicit drugs.  ROS  Dilation: 2 Effacement (%): 80 Station: -3 Exam by:: Dr. Skeet Simmer  Blood pressure 141/80, pulse 110, temperature 97.3 F (36.3 C), temperature source Oral, resp. rate 22, height 5\' 4"  (1.626 m), weight 105.235 kg (232 lb), last menstrual period 11/02/2010. Maternal Exam:  Uterine Assessment: Contraction strength is firm.  Contraction frequency is regular.   Abdomen: Patient reports no abdominal tenderness. Fetal presentation: vertex  Introitus: Normal vulva. Ferning test: positive.  Amniotic fluid character: bloody.  Pelvis: adequate for delivery.   Cervix: Cervix evaluated by sterile speculum exam and digital exam.     Physical Exam  Constitutional: She appears well-developed and well-nourished.  HENT:  Head: Normocephalic and atraumatic.  Neck: No JVD present.  Cardiovascular: Normal rate and regular rhythm.   Respiratory: Effort normal and breath sounds normal.    Prenatal labs: ABO, Rh:  A/NEG/-- (10/03 1347) Antibody: NEG (10/03 1347) Rubella: 37.1 (10/03 1347) RPR: NON REAC (01/08 1022)  HBsAg: NEGATIVE (10/03 1347)  HIV: NON REACTIVE (01/08 1022)  GBS: POSITIVE (03/08 1233)   Assessment/Plan: 21 year old G2P0010 @ [redacted]w[redacted]d who presents with Rupture of Membranes approximately 12 hours ago and in early labor - Admit and augment early labor with pitocin since membranes are ruptures - GBS +, start PCN prophylaxis - RH neg, plan for RhoGam after delivery - anticipate NSVD.    Jill Stopka 07/10/2011, 9:55 AM

## 2011-07-11 ENCOUNTER — Encounter (HOSPITAL_COMMUNITY): Payer: Self-pay | Admitting: *Deleted

## 2011-07-11 MED ORDER — OXYCODONE-ACETAMINOPHEN 5-325 MG PO TABS
1.0000 | ORAL_TABLET | ORAL | Status: DC | PRN
  Administered 2011-07-11: 1 via ORAL
  Filled 2011-07-11: qty 1

## 2011-07-11 MED ORDER — LANOLIN HYDROUS EX OINT: TOPICAL_OINTMENT | CUTANEOUS | Status: DC | PRN

## 2011-07-11 MED ORDER — WITCH HAZEL-GLYCERIN EX PADS: 1.0000 "application " | MEDICATED_PAD | CUTANEOUS | Status: DC | PRN

## 2011-07-11 MED ORDER — DIBUCAINE 1 % RE OINT: 1.0000 "application " | TOPICAL_OINTMENT | RECTAL | Status: DC | PRN

## 2011-07-11 MED ORDER — BENZOCAINE-MENTHOL 20-0.5 % EX AERO
INHALATION_SPRAY | CUTANEOUS | Status: AC
  Administered 2011-07-11: 1 via TOPICAL
  Filled 2011-07-11: qty 56

## 2011-07-11 MED ORDER — ZOLPIDEM TARTRATE 5 MG PO TABS: 5.0000 mg | ORAL_TABLET | Freq: Every evening | ORAL | Status: DC | PRN

## 2011-07-11 MED ORDER — BENZOCAINE-MENTHOL 20-0.5 % EX AERO
1.0000 "application " | INHALATION_SPRAY | CUTANEOUS | Status: DC | PRN
  Administered 2011-07-11: 1 via TOPICAL

## 2011-07-11 MED ORDER — DIPHENHYDRAMINE HCL 25 MG PO CAPS: 25.0000 mg | ORAL_CAPSULE | Freq: Four times a day (QID) | ORAL | Status: DC | PRN

## 2011-07-11 MED ORDER — PRENATAL MULTIVITAMIN CH: 1.0000 | ORAL_TABLET | Freq: Every day | ORAL | Status: DC

## 2011-07-11 MED ORDER — IBUPROFEN 600 MG PO TABS
600.0000 mg | ORAL_TABLET | Freq: Four times a day (QID) | ORAL | Status: DC
  Administered 2011-07-11 – 2011-07-12 (×5): 600 mg via ORAL
  Filled 2011-07-11 (×6): qty 1

## 2011-07-11 MED ORDER — TETANUS-DIPHTH-ACELL PERTUSSIS 5-2.5-18.5 LF-MCG/0.5 IM SUSP: 0.5000 mL | Freq: Once | INTRAMUSCULAR | Status: DC

## 2011-07-11 MED ORDER — ONDANSETRON HCL 4 MG PO TABS: 4.0000 mg | ORAL_TABLET | ORAL | Status: DC | PRN

## 2011-07-11 MED ORDER — ONDANSETRON HCL 4 MG/2ML IJ SOLN: 4.0000 mg | INTRAMUSCULAR | Status: DC | PRN

## 2011-07-11 MED ORDER — SIMETHICONE 80 MG PO CHEW: 80.0000 mg | CHEWABLE_TABLET | ORAL | Status: DC | PRN

## 2011-07-11 MED ORDER — SENNOSIDES-DOCUSATE SODIUM 8.6-50 MG PO TABS
2.0000 | ORAL_TABLET | Freq: Every day | ORAL | Status: DC
  Administered 2011-07-11: 2 via ORAL

## 2011-07-11 NOTE — Procedures (Signed)
Delivery Note At 1:08 AM a viable female was delivered via Vaginal, Spontaneous Delivery (Presentation: ; Occiput Anterior).  APGAR: 8, 9; weight 7 lb 11.5 oz (3500 g).   Placenta status: Intact, Spontaneous.  Cord: 3 vessels with the following complications: None.    Anesthesia: Epidural  Episiotomy: None Lacerations: 2nd degree Suture Repair: 3.0 Est. Blood Loss (mL): 350  Mom to postpartum.  Baby to nursery-stable.  Jolane Bankhead,JEFF 07/11/2011, 1:57 AM

## 2011-07-11 NOTE — Progress Notes (Signed)
Patient ID: SYONA WROBLEWSKI, female   DOB: 05/04/1990, 21 y.o.   MRN: [id number] NASTASSJA WITKOP is a 21 y.o. G2P0010 at [redacted]w[redacted]d by LMP admitted for Rupture of membranes and early labor  Subjective: Back pressure continues as before and would like to be checked.     Objective: BP 96/42  Pulse 134  Temp(Src) 99.5 F (37.5 C) (Axillary)  Resp 20  Ht 5\' 4"  (1.626 m)  Wt 232 lb (105.235 kg)  BMI 39.82 kg/m2  SpO2 99%  LMP 11/02/2010 I/O last 3 completed shifts: In: 34.5 [I.V.:34.5] Out: -     FHT:  FHR: 140 bpm, variability: moderate,  accelerations:  Present,  decelerations:  Variable decels. UC:   regular, every 2-3  minutes SVE:   10/100/+3.  Labs: Lab Results  Component Value Date   WBC 8.3 07/10/2011   HGB 12.8 07/10/2011   HCT 37.5 07/10/2011   MCV 83.0 07/10/2011   PLT 190 07/10/2011    Assessment / Plan: Augmentation of labor, progressing well.  Tried pushing several times in room.  Will continue to labor down.  Baby looks good.  Patient ready to start pushing.  Expectant management.   Labor: Progressing on pitocin Preeclampsia:  No signs, continue vitals monitoring Fetal Wellbeing:  Category I Pain Control:  Epidural I/D:  PCN for GBS PPX.  Amp/Gent for broadening coverage as febrile.  Current temp with me in room was 98.5.  No further Tylenol has been required since 9:30.  .  Anticipated MOD:  NSVD  Duana Benedict,JEFF 07/11/2011, 12:14 AM

## 2011-07-11 NOTE — Progress Notes (Signed)
Post Partum Day 1 Subjective: no complaints, up ad lib, voiding, tolerating PO and + flatus  Objective: Blood pressure 113/73, pulse 89, temperature 98.7 F (37.1 C), temperature source Oral, resp. rate 18, height 5\' 4"  (1.626 m), weight 232 lb (105.235 kg), last menstrual period 11/02/2010, SpO2 99.00%, unknown if currently breastfeeding.  Physical Exam:  General: alert, cooperative, appears stated age and no distress Lochia: appropriate Uterine Fundus: firm DVT Evaluation: No evidence of DVT seen on physical exam. No cords or calf tenderness. No significant calf/ankle edema.   Basename 07/10/11 0950  HGB 12.8  HCT 37.5    Assessment/Plan: Plan for discharge tomorrow [facility]U [facility]  Anticipate continued normal postpartum course.    LOS: 1 day   Jadzia Ibsen,JEFF 07/11/2011, 1:41 PM

## 2011-07-11 NOTE — Progress Notes (Signed)
UR chart review completed.  

## 2011-07-11 NOTE — Anesthesia Postprocedure Evaluation (Signed)
  Anesthesia Post-op Note  Patient: Gail Walker  Procedure(s) Performed: * No procedures listed *  Patient Location: Mother/Baby  Anesthesia Type: Epidural  Level of Consciousness: alert  and oriented  Airway and Oxygen Therapy: Patient Spontanous Breathing  Post-op Pain: mild  Post-op Assessment: Patient's Cardiovascular Status Stable and Respiratory Function Stable  Post-op Vital Signs: stable  Complications: No apparent anesthesia complications

## 2011-07-12 ENCOUNTER — Other Ambulatory Visit: Payer: Medicaid Other

## 2011-07-12 MED ORDER — RHO D IMMUNE GLOBULIN 1500 UNIT/2ML IJ SOLN
300.0000 ug | Freq: Once | INTRAMUSCULAR | Status: AC
  Administered 2011-07-12: 300 ug via INTRAMUSCULAR
  Filled 2011-07-12: qty 2

## 2011-07-12 MED ORDER — LANOLIN HYDROUS EX OINT: 1.0000 "application " | TOPICAL_OINTMENT | CUTANEOUS | Status: DC | PRN

## 2011-07-12 MED ORDER — IBUPROFEN 600 MG PO TABS: 600.0000 mg | ORAL_TABLET | Freq: Four times a day (QID) | ORAL | Status: AC

## 2011-07-12 MED ORDER — BENZOCAINE-MENTHOL 20-0.5 % EX AERO: 1.0000 "application " | INHALATION_SPRAY | CUTANEOUS | Status: DC | PRN

## 2011-07-12 MED ORDER — PRENATAL RX 60-1 MG PO TABS: 1.0000 | ORAL_TABLET | Freq: Every day | ORAL | Status: DC

## 2011-07-12 NOTE — Discharge Summary (Signed)
I have seen and examined this patient and I agree with the above. Cam Hai 10:42 AM 07/12/2011

## 2011-07-12 NOTE — Discharge Summary (Addendum)
Physician Discharge Summary  Patient ID: Gail Walker MRN: [id number] DOB/AGE: 1990-10-02 21 y.o.  Admit date: 07/10/2011 Discharge date: 07/12/2011  Admission Diagnoses:  Discharge Diagnoses:  Active Problems:  * No active hospital problems. *    Discharged Condition: good  Hospital Course: NSVD at 40 weeks for viable female infant.  2nd degree laceration repaired with 3-0 Vicryl.  No postpartum complications or concerns.  Discharge home 2nd postpartum day.   Consults: None  Significant Diagnostic Studies: None  Treatments: None  Discharge Exam: Blood pressure 127/74, pulse 87, temperature 97.6 F (36.4 C), temperature source Oral, resp. rate 18, height 5\' 4"  (1.626 m), weight 232 lb (105.235 kg), last menstrual period 11/02/2010, SpO2 99.00%, unknown if currently breastfeeding. General appearance: alert, cooperative, appears stated age and no distress Resp: clear to auscultation bilaterally Cardio: regular rate and rhythm, S1, S2 normal, no murmur, click, rub or gallop GI: soft, non-tender; bowel sounds normal; no masses,  no organomegaly Extremities: extremities normal, atraumatic, no cyanosis or edema Skin: Skin color, texture, turgor normal. No rashes or lesions Incision/Wound:  Healing well, examined with nurse in room.  Fundus:  Firm and 2 cm below umbilicus   Disposition: 01-Home or Self Care  Discharge Orders    Future Appointments: Provider: Department: Dept Phone: Center:   07/12/2011 9:00 AM [facility] [phone] [facility]     Future Orders Please Complete By Expires   Diet - low sodium heart healthy      Activity as tolerated      Lifting restrictions      Comments:   Weight restriction of nothing heavier than your baby.   Sexual acrtivity      Comments:   No sex for 6 weeks   Call MD for:  temperature >100.4      Call MD for:  persistant nausea and vomiting      Call MD for:  severe uncontrolled pain      Call MD for:  redness,  tenderness, or signs of infection (pain, swelling, redness, odor or green/yellow discharge around incision site)      Call MD for:  difficulty breathing, headache or visual disturbances      Call MD for:  persistant dizziness or light-headedness      Call MD for:  extreme fatigue      Strep B DNA probe      Comments:   This external order was created through the Results Console.     Medication List  As of 07/12/2011  7:54 AM   STOP taking these medications         miconazole 2 % cream         TAKE these medications         albuterol 108 (90 BASE) MCG/ACT inhaler   Commonly known as: PROVENTIL HFA;VENTOLIN HFA   Inhale 2 puffs into the lungs every 6 (six) hours as needed. For SOB      benzocaine-Menthol 20-0.5 % Aero   Commonly known as: DERMOPLAST   Apply 1 application topically as needed (perineal discomfort).      Clindamycin-Benzoyl Per (Refr) gel   Apply 1 drop topically 2 (two) times daily.      ibuprofen 600 MG tablet   Commonly known as: ADVIL,MOTRIN   Take 1 tablet (600 mg total) by mouth every 6 (six) hours.      lanolin Oint   Apply 1 application topically as needed (for breast care).  prenatal multivitamin 60-1 MG tablet   Take 1 tablet by mouth daily.      ranitidine 150 MG tablet   Commonly known as: ZANTAC   Take 1 tablet (150 mg total) by mouth 2 (two) times daily.      triamcinolone ointment 0.1 %   Commonly known as: KENALOG   Apply topically 2 (two) times daily as needed. For eczema           Follow-up Information    Follow up with [facility] on 07/14/2011.   Contact information:   [address] [place] [address] [phone]         Signed: Renold Don 07/12/2011, 7:54 AM

## 2011-07-12 NOTE — Discharge Instructions (Signed)
Vaginal Delivery Care After  Change your pad on each trip to the bathroom.   Wipe gently with toilet paper during your hospital stay. Always wipe from front to back. A spray bottle with warm tap water could also be used or a towelette if available.   Place your soiled pad and toilet paper in a bathroom wastebasket with a plastic bag liner.   During your hospital stay, save any clots. If you pass a clot while on the toilet, do not flush it. Also, if your vaginal flow seems excessive to you, notify nursing personnel.   The first time you get out of bed after delivery, wait for assistance from a nurse. Do not get up alone at any time if you feel weak or dizzy.   Bend and extend your ankles forcefully so that you feel the calves of your legs get hard. Do this 6 times every hour when you are in bed and awake.   Do not sit with one foot under you, dangle your legs over the edge of the bed, or maintain a position that hinders the circulation in your legs.   Many women experience after pains for 2 to 3 days after delivery. These after pains are mild uterine contractions. Ask the nurse for a pain medication if you need something for this. Sometimes breastfeeding stimulates after pains; if you find this to be true, ask for the medication  -  hour before the next feeding.   For you and your infant's protection, do not go beyond the door(s) of the obstetric unit. Do not carry your baby in your arms in the hallway. When taking your baby to and from your room, put your baby in the bassinet and push the bassinet.   Mothers may have their babies in their room as much as they desire.  Document Released: 04/01/2000 Document Revised: 03/24/2011 Document Reviewed: 03/02/2007 [employer] Patient Information 2012 [employer]. 

## 2011-07-13 ENCOUNTER — Encounter: Payer: Medicaid Other | Admitting: Family Medicine

## 2011-07-13 LAB — RH IG WORKUP (INCLUDES ABO/RH)
ABO/RH(D): A NEG
Fetal Screen: NEGATIVE
Gestational Age(Wks): 40
Unit division: 0

## 2011-07-18 ENCOUNTER — Telehealth: Payer: Self-pay | Admitting: Family Medicine

## 2011-07-18 NOTE — Telephone Encounter (Signed)
Need note to return to work asap.  Please call when ready for pickup

## 2011-07-26 NOTE — Telephone Encounter (Signed)
Does she still need this note?  This was dated 4/1, I was out for vacation until yesterday.

## 2011-08-02 NOTE — Discharge Summary (Signed)
I agree with the above. Clelia Croft, Daleen Steinhaus 1:57 PM 08/02/2011

## 2011-08-12 ENCOUNTER — Other Ambulatory Visit: Payer: Self-pay | Admitting: Family Medicine

## 2011-08-18 ENCOUNTER — Ambulatory Visit (INDEPENDENT_AMBULATORY_CARE_PROVIDER_SITE_OTHER): Payer: Self-pay | Admitting: Family Medicine

## 2011-08-18 ENCOUNTER — Encounter: Payer: Self-pay | Admitting: Family Medicine

## 2011-08-18 NOTE — Progress Notes (Signed)
  Subjective:    Patient ID: Gail Walker, female    DOB: 04-18-1991, 21 y.o.   MRN: [id number]  HPI G2P1  March 25th, 40 weeks, vaginal delivery, has 2nd degree laceration.  Normal postpartum course.  Bottle feeding. Vaginal bleeding "like a period"  Not abdominal pain, no perineal pain.    Helped by her mom, not stressful.  Denies sadness and crying except for 1 day a week ago, triggered by stress.  Previously used pills for contraception.  Has not had sex since having baby.  No current sexual partner.  Would like Mirena.   Review of Systemssee HPI     Objective:   Physical Exam GEN: NAD Pelvic Exam:        External: normal female genitalia without lesions or masses        Vagina: normal without lesions or masses        Cervix: normal without lesions or masses        Adnexa: normal bimanual exam without masses or fullness        Uterus: normal by palpation          Assessment & Plan:

## 2011-08-18 NOTE — Assessment & Plan Note (Signed)
Routine postpartum course.  She plans on having medicaid soon and will opt for Mirena.  Declines need for alternate form of birth control at this time.

## 2011-08-18 NOTE — Patient Instructions (Signed)
Let me know if you feel sadder or more stressed than you feel is normal  Make appointment to place Mirena when your Medicaid returns  You can get pregnant even on the first time!  Please let me know if I can offer you birth control before you get your Mirena

## 2011-08-28 ENCOUNTER — Emergency Department (HOSPITAL_COMMUNITY): Payer: Self-pay

## 2011-08-28 ENCOUNTER — Encounter (HOSPITAL_COMMUNITY): Payer: Self-pay | Admitting: Emergency Medicine

## 2011-08-28 ENCOUNTER — Observation Stay (HOSPITAL_COMMUNITY)
Admission: EM | Admit: 2011-08-28 | Discharge: 2011-08-28 | Disposition: A | Discharge status: Home / Self Care | Payer: Self-pay | Attending: Emergency Medicine | Admitting: Emergency Medicine

## 2011-08-28 DIAGNOSIS — J45901 Unspecified asthma with (acute) exacerbation: Principal | ICD-10-CM | POA: Insufficient documentation

## 2011-08-28 DIAGNOSIS — K219 Gastro-esophageal reflux disease without esophagitis: Secondary | ICD-10-CM | POA: Insufficient documentation

## 2011-08-28 LAB — BASIC METABOLIC PANEL
CO2: 23 mEq/L (ref 19–32)
Calcium: 9.1 mg/dL (ref 8.4–10.5)
Chloride: 103 mEq/L (ref 96–112)
Creatinine, Ser: 0.9 mg/dL (ref 0.50–1.10)
Glucose, Bld: 112 mg/dL — ABNORMAL HIGH (ref 70–99)

## 2011-08-28 LAB — CBC
HCT: 35.9 % — ABNORMAL LOW (ref 36.0–46.0)
Hemoglobin: 12.2 g/dL (ref 12.0–15.0)
MCH: 27.6 pg (ref 26.0–34.0)
MCV: 81.2 fL (ref 78.0–100.0)
RBC: 4.42 MIL/uL (ref 3.87–5.11)
WBC: 8.5 10*3/uL (ref 4.0–10.5)

## 2011-08-28 MED ORDER — ACETAMINOPHEN 325 MG PO TABS: 650.0000 mg | ORAL_TABLET | ORAL | Status: DC | PRN

## 2011-08-28 MED ORDER — ALBUTEROL (5 MG/ML) CONTINUOUS INHALATION SOLN
10.0000 mg/h | INHALATION_SOLUTION | Freq: Once | RESPIRATORY_TRACT | Status: AC
  Administered 2011-08-28: 10 mg/h via RESPIRATORY_TRACT
  Filled 2011-08-28: qty 20

## 2011-08-28 MED ORDER — ALBUTEROL SULFATE (5 MG/ML) 0.5% IN NEBU
5.0000 mg | INHALATION_SOLUTION | Freq: Once | RESPIRATORY_TRACT | Status: AC
  Administered 2011-08-28: 5 mg via RESPIRATORY_TRACT
  Filled 2011-08-28: qty 1

## 2011-08-28 MED ORDER — ALBUTEROL SULFATE HFA 108 (90 BASE) MCG/ACT IN AERS
INHALATION_SPRAY | RESPIRATORY_TRACT | Status: AC
  Filled 2011-08-28: qty 6.7

## 2011-08-28 MED ORDER — ALBUTEROL SULFATE HFA 108 (90 BASE) MCG/ACT IN AERS: 2.0000 | INHALATION_SPRAY | Freq: Four times a day (QID) | RESPIRATORY_TRACT | Status: DC | PRN

## 2011-08-28 MED ORDER — PREDNISONE 10 MG PO TABS: 20.0000 mg | ORAL_TABLET | Freq: Every day | ORAL | Status: DC

## 2011-08-28 MED ORDER — IPRATROPIUM BROMIDE 0.02 % IN SOLN
0.5000 mg | Freq: Once | RESPIRATORY_TRACT | Status: AC
  Administered 2011-08-28: 0.5 mg via RESPIRATORY_TRACT
  Filled 2011-08-28: qty 2.5

## 2011-08-28 MED ORDER — PREDNISONE 20 MG PO TABS
ORAL_TABLET | ORAL | Status: AC
  Administered 2011-08-28: 20 mg via ORAL
  Filled 2011-08-28: qty 3

## 2011-08-28 NOTE — ED Notes (Signed)
Report given to [place], Charity fundraiser in CDU.  Patient to be moved to CDU.

## 2011-08-28 NOTE — ED Notes (Signed)
PT. REPORTS PROGRESSING SOB WITH WHEEZING AND PRODUCTIVE COUGH FOR SEVERAL DAYS .DENIES FEVER OR CHILLS.

## 2011-08-28 NOTE — ED Provider Notes (Signed)
History     CSN: [id number]  Arrival date & time 08/28/11  0234   First MD Initiated Contact with Patient 08/28/11 [phone]      Chief Complaint  Patient presents with  . Wheezing    (Consider location/radiation/quality/duration/timing/severity/associated sxs/prior treatment) HPI 21 year old female presents to emergency department complaining of one week of ascending shortness of breath and cough. Patient has been using her albuterol inhaler without improvement in symptoms. She denies any fever. Her sputum has become a yellow-green in color. Patient reports history of admissions in the past, last admission about 10 months ago. No chest pain no abdominal pain no rash no known trigger for her asthma Past Medical History  Diagnosis Date  . Chlamydia 2012  . Acne   . Asthma     uses albuterol inhaler daily  . GERD (gastroesophageal reflux disease)     during pregnancy - takes zantac    Past Surgical History  Procedure Date  . Pregnancy termination   . No past surgeries     Family History  Problem Relation Age of Onset  . Anesthesia problems Neg Hx     History  Substance Use Topics  . Smoking status: Former Smoker -- 0.2 packs/day    Quit date: 03/03/2011  . Smokeless tobacco: Never Used  . Alcohol Use: No    OB History    Grav Para Term Preterm Abortions TAB SAB Ect Mult Living   2 1 1  0 1 1 0 0 0 1      Review of Systems  All other systems reviewed and are negative.    Allergies  Review of patient's allergies indicates no known allergies.  Home Medications   Current Outpatient Rx  Name Route Sig Dispense Refill  . ALBUTEROL SULFATE HFA 108 (90 BASE) MCG/ACT IN AERS Inhalation Inhale 2 puffs into the lungs every 6 (six) hours as needed. For shortness of breath.    Marland Kitchen CLINDAMYCIN PHOS-BENZOYL PEROX 1-5 % EX GEL Topical Apply 1 application topically 2 (two) times daily.    . TRIAMCINOLONE ACETONIDE 0.1 % EX OINT Topical Apply topically 2 (two) times daily as  needed. For eczema      BP 123/73  Pulse 106  Temp(Src) 98.2 F (36.8 C) (Oral)  Resp 20  SpO2 98%  LMP 08/22/2011  Breastfeeding? No  Physical Exam  Nursing note and vitals reviewed. Constitutional: She appears well-developed and well-nourished.       Obese in moderate respiratory distress  HENT:  Head: Normocephalic and atraumatic.  Eyes: Conjunctivae and EOM are normal. Pupils are equal, round, and reactive to light.  Neck: Normal range of motion. Neck supple. No JVD present. No tracheal deviation present. No thyromegaly present.  Cardiovascular: Regular rhythm, normal heart sounds and intact distal pulses.  Exam reveals no gallop and no friction rub.   No murmur heard.      Tachycardia and hypertension noted   Pulmonary/Chest: No stridor. She is in respiratory distress (patient speaking in 2-3 word answers no tripod position, some accessory muscle use). She has wheezes (wheezes throughout with overall diminished breath sounds and poor air exchange). She has no rales. She exhibits no tenderness.  Abdominal: Bowel sounds are normal. She exhibits no distension and no mass. There is no tenderness. There is no rebound and no guarding.  Musculoskeletal: Normal range of motion. She exhibits no edema and no tenderness.  Lymphadenopathy:    She has no cervical adenopathy.  Skin: Skin is warm and dry. No rash  noted. No erythema. No pallor.    ED Course  Procedures (including critical care time)   Labs Reviewed  CBC  BASIC METABOLIC PANEL   Dg Chest 2 View  08/28/2011  *RADIOLOGY REPORT*  Clinical Data: Wheezing and shortness of breath for 2 weeks.  CHEST - 2 VIEW  Comparison: 08/23/2010  Findings: The heart size and pulmonary vascularity are normal. The lungs appear clear and expanded without focal air space disease or consolidation. No blunting of the costophrenic angles.  Suggestion of mild emphysematous change.  No pneumothorax.  No significant change since previous study.   IMPRESSION: No evidence of active pulmonary disease.  Original Report Authenticated By: Marlon Pel, M.D.     1. Asthma exacerbation       MDM  21 year old female with asthma exacerbation. Patient without improvement after 2 back-to-back nebs. Patient was placed on hour-long continuous neb, with some improvement. Patient is now moving air better but still wheezing significantly. We'll place on CCU protocol for wheezing. If patient does not improve may need admission.        Olivia Mackie, MD 08/28/11 [phone]

## 2011-08-28 NOTE — ED Notes (Signed)
Pt d/c home in NAD. Pt voiced understanding of d/c instructions as well as how to use medications. Pt ambulated with quick, steady gait.

## 2011-08-28 NOTE — ED Notes (Signed)
Albuterol inhaler given to pt to take home per Dr. Freida Busman

## 2011-08-28 NOTE — ED Provider Notes (Signed)
Patient seen examined. Wheezes have greatly improved. Will be placed on prednisone and given an albuterol inhaler and will see her Dr. as needed  Gail Baker, MD 08/28/11 1242

## 2011-08-28 NOTE — Discharge Instructions (Signed)

## 2011-08-31 ENCOUNTER — Emergency Department (HOSPITAL_COMMUNITY)
Admission: EM | Admit: 2011-08-31 | Discharge: 2011-09-01 | Disposition: A | Discharge status: Home / Self Care | Payer: Self-pay | Attending: Emergency Medicine | Admitting: Emergency Medicine

## 2011-08-31 ENCOUNTER — Encounter (HOSPITAL_COMMUNITY): Payer: Self-pay | Admitting: Emergency Medicine

## 2011-08-31 DIAGNOSIS — Z87891 Personal history of nicotine dependence: Secondary | ICD-10-CM | POA: Insufficient documentation

## 2011-08-31 DIAGNOSIS — J45901 Unspecified asthma with (acute) exacerbation: Secondary | ICD-10-CM

## 2011-08-31 DIAGNOSIS — J45909 Unspecified asthma, uncomplicated: Secondary | ICD-10-CM | POA: Insufficient documentation

## 2011-08-31 MED ORDER — IPRATROPIUM BROMIDE 0.02 % IN SOLN
RESPIRATORY_TRACT | Status: AC
  Filled 2011-08-31: qty 2.5

## 2011-08-31 MED ORDER — IPRATROPIUM BROMIDE 0.02 % IN SOLN
0.5000 mg | Freq: Once | RESPIRATORY_TRACT | Status: AC
  Administered 2011-08-31: 0.5 mg via RESPIRATORY_TRACT

## 2011-08-31 MED ORDER — ALBUTEROL (5 MG/ML) CONTINUOUS INHALATION SOLN
10.0000 mg/h | INHALATION_SOLUTION | Freq: Once | RESPIRATORY_TRACT | Status: AC
  Administered 2011-08-31: 10 mg/h via RESPIRATORY_TRACT

## 2011-08-31 MED ORDER — ALBUTEROL SULFATE (5 MG/ML) 0.5% IN NEBU
2.5000 mg | INHALATION_SOLUTION | Freq: Once | RESPIRATORY_TRACT | Status: AC
  Administered 2011-08-31: 5 mg via RESPIRATORY_TRACT

## 2011-08-31 MED ORDER — MAGNESIUM SULFATE 40 MG/ML IJ SOLN
2.0000 g | Freq: Once | INTRAMUSCULAR | Status: AC
  Administered 2011-08-31: 2 g via INTRAVENOUS
  Filled 2011-08-31: qty 50

## 2011-08-31 MED ORDER — ALBUTEROL SULFATE (5 MG/ML) 0.5% IN NEBU
5.0000 mg | INHALATION_SOLUTION | RESPIRATORY_TRACT | Status: DC
  Administered 2011-08-31 – 2011-09-01 (×4): 5 mg via RESPIRATORY_TRACT
  Filled 2011-08-31: qty 2
  Filled 2011-08-31 (×2): qty 1

## 2011-08-31 MED ORDER — PREDNISONE 20 MG PO TABS
60.0000 mg | ORAL_TABLET | Freq: Once | ORAL | Status: AC
  Administered 2011-08-31: 60 mg via ORAL
  Filled 2011-08-31: qty 3

## 2011-08-31 MED ORDER — ALBUTEROL SULFATE (5 MG/ML) 0.5% IN NEBU
INHALATION_SOLUTION | RESPIRATORY_TRACT | Status: AC
  Filled 2011-08-31: qty 2.5

## 2011-08-31 MED ORDER — IPRATROPIUM BROMIDE 0.02 % IN SOLN
RESPIRATORY_TRACT | Status: AC
  Administered 2011-08-31: 0.5 mg via RESPIRATORY_TRACT
  Filled 2011-08-31: qty 2.5

## 2011-08-31 MED ORDER — ALBUTEROL SULFATE (5 MG/ML) 0.5% IN NEBU
INHALATION_SOLUTION | RESPIRATORY_TRACT | Status: AC
  Filled 2011-08-31: qty 1

## 2011-08-31 MED ORDER — ALBUTEROL SULFATE (5 MG/ML) 0.5% IN NEBU
5.0000 mg | INHALATION_SOLUTION | Freq: Once | RESPIRATORY_TRACT | Status: AC
  Administered 2011-08-31: 5 mg via RESPIRATORY_TRACT

## 2011-08-31 MED ORDER — ALBUTEROL SULFATE (5 MG/ML) 0.5% IN NEBU
INHALATION_SOLUTION | RESPIRATORY_TRACT | Status: AC
  Administered 2011-08-31: 5 mg via RESPIRATORY_TRACT
  Filled 2011-08-31: qty 1

## 2011-08-31 NOTE — ED Provider Notes (Signed)
History     CSN: [id number]  Arrival date & time 08/31/11  1633   First MD Initiated Contact with Patient 08/31/11 1758      Chief Complaint  Patient presents with  . Asthma    (Consider location/radiation/quality/duration/timing/severity/associated sxs/prior treatment) HPI  21-year-old female with history of asthma presents with chief complaints of shortness of breath. Patient states she was at work today and reports that the heat has caused her to have asthma attack. She complains of gradual onset of nonproductive cough, shortness of breath, and lightheadedness. Symptoms are very similar to her prior asthma attack. Denies fever, chills, cp, or rash.  Denies calf pain or leg swelling. Denies ICU admission or prior intubation due to asthma related complication.  Last seen in ED 3 days ago for same.  Was given albuterol inhaler which she has finished using.  Was given prescription for steroid which she has not filled.      Past Medical History  Diagnosis Date  . Chlamydia 2012  . Acne   . Asthma     uses albuterol inhaler daily  . GERD (gastroesophageal reflux disease)     during pregnancy - takes zantac    Past Surgical History  Procedure Date  . Pregnancy termination   . No past surgeries     Family History  Problem Relation Age of Onset  . Anesthesia problems Neg Hx     History  Substance Use Topics  . Smoking status: Former Smoker -- 0.2 packs/day    Quit date: 03/03/2011  . Smokeless tobacco: Never Used  . Alcohol Use: No    OB History    Grav Para Term Preterm Abortions TAB SAB Ect Mult Living   2 1 1  0 1 1 0 0 0 1      Review of Systems  All other systems reviewed and are negative.    Allergies  Review of patient's allergies indicates no known allergies.  Home Medications   Current Outpatient Rx  Name Route Sig Dispense Refill  . ALBUTEROL SULFATE HFA 108 (90 BASE) MCG/ACT IN AERS Inhalation Inhale 2 puffs into the lungs every 6 (six) hours as  needed. For shortness of breath.    Marland Kitchen CLINDAMYCIN PHOS-BENZOYL PEROX 1-5 % EX GEL Topical Apply 1 application topically 2 (two) times daily.    Marland Kitchen PREDNISONE 10 MG PO TABS Oral Take 20 mg by mouth daily.    . TRIAMCINOLONE ACETONIDE 0.1 % EX OINT Topical Apply topically 2 (two) times daily as needed. For eczema      BP 152/93  Pulse 110  Temp(Src) 98 F (36.7 C) (Oral)  Resp 21  SpO2 93%  LMP 08/22/2011  Physical Exam  Nursing note and vitals reviewed. Constitutional: She is oriented to person, place, and time. She appears well-developed and well-nourished. No distress.       Awake, alert, nontoxic appearance.  IN mild resp distress, speaking 5-10 words per sentences.    HENT:  Head: Atraumatic.  Eyes: Conjunctivae are normal. Right eye exhibits no discharge. Left eye exhibits no discharge.  Neck: Neck supple.  Cardiovascular: Regular rhythm.        Tachycardia noted  Pulmonary/Chest: Effort normal. No stridor. She exhibits no tenderness.       Mild tachypnea, no significant accessory muscle use.  Inspiratory and expiratory wheezes noted. No rales or rhonchi  Abdominal: Soft. There is no tenderness. There is no rebound.  Musculoskeletal: She exhibits no edema and no tenderness.  ROM appears intact, no obvious focal weakness  Neurological: She is alert and oriented to person, place, and time.       Mental status and motor strength appears intact  Skin: No rash noted.  Psychiatric: She has a normal mood and affect.    ED Course  Procedures (including critical care time)  Labs Reviewed - No data to display No results found.   No diagnosis found.  Results for orders placed during the hospital encounter of 08/28/11  CBC      Component Value Range   WBC 8.5  4.0 - 10.5 (K/uL)   RBC 4.42  3.87 - 5.11 (MIL/uL)   Hemoglobin 12.2  12.0 - 15.0 (g/dL)   HCT 40.9 (*) 81.1 - 46.0 (%)   MCV 81.2  78.0 - 100.0 (fL)   MCH 27.6  26.0 - 34.0 (pg)   MCHC 34.0  30.0 - 36.0 (g/dL)     RDW 91.4  78.2 - 95.6 (%)   Platelets 276  150 - 400 (K/uL)  BASIC METABOLIC PANEL      Component Value Range   Sodium 139  135 - 145 (mEq/L)   Potassium 3.7  3.5 - 5.1 (mEq/L)   Chloride 103  96 - 112 (mEq/L)   CO2 23  19 - 32 (mEq/L)   Glucose, Bld 112 (*) 70 - 99 (mg/dL)   BUN 12  6 - 23 (mg/dL)   Creatinine, Ser 2.13  0.50 - 1.10 (mg/dL)   Calcium 9.1  8.4 - 08.6 (mg/dL)   GFR calc non Af Amer >90  >90 (mL/min)   GFR calc Af Amer >90  >90 (mL/min)   Dg Chest 2 View  08/28/2011  *RADIOLOGY REPORT*  Clinical Data: Wheezing and shortness of breath for 2 weeks.  CHEST - 2 VIEW  Comparison: 08/23/2010  Findings: The heart size and pulmonary vascularity are normal. The lungs appear clear and expanded without focal air space disease or consolidation. No blunting of the costophrenic angles.  Suggestion of mild emphysematous change.  No pneumothorax.  No significant change since previous study.  IMPRESSION: No evidence of active pulmonary disease.  Original Report Authenticated By: Marlon Pel, M.D.      MDM  Asthma exacerbation due to heat.  Has been receiving breathing treatment with minimal improvement.  Will move to CDU under wheeze protocol for 8 hrs obs.  Discussed care with my attending and with Rhea Bleacher, PA-C.           Fayrene Helper, PA-C 08/31/11 1902

## 2011-08-31 NOTE — ED Notes (Signed)
Pt received HHN in Triage, breathing easier at this time

## 2011-08-31 NOTE — ED Provider Notes (Signed)
History     CSN: [id number]  Arrival date & time 08/31/11  1633   First MD Initiated Contact with Patient 08/31/11 1758      Chief Complaint  Patient presents with  . Asthma    (Consider location/radiation/quality/duration/timing/severity/associated sxs/prior treatment) HPI  Past Medical History  Diagnosis Date  . Chlamydia 2012  . Acne   . Asthma     uses albuterol inhaler daily  . GERD (gastroesophageal reflux disease)     during pregnancy - takes zantac    Past Surgical History  Procedure Date  . Pregnancy termination   . No past surgeries     Family History  Problem Relation Age of Onset  . Anesthesia problems Neg Hx     History  Substance Use Topics  . Smoking status: Former Smoker -- 0.2 packs/day    Quit date: 03/03/2011  . Smokeless tobacco: Never Used  . Alcohol Use: No    OB History    Grav Para Term Preterm Abortions TAB SAB Ect Mult Living   2 1 1  0 1 1 0 0 0 1      Review of Systems  Allergies  Review of patient's allergies indicates no known allergies.  Home Medications   Current Outpatient Rx  Name Route Sig Dispense Refill  . ALBUTEROL SULFATE HFA 108 (90 BASE) MCG/ACT IN AERS Inhalation Inhale 2 puffs into the lungs every 6 (six) hours as needed. For shortness of breath.    Marland Kitchen CLINDAMYCIN PHOS-BENZOYL PEROX 1-5 % EX GEL Topical Apply 1 application topically 2 (two) times daily.    Marland Kitchen PREDNISONE 10 MG PO TABS Oral Take 20 mg by mouth daily.    . TRIAMCINOLONE ACETONIDE 0.1 % EX OINT Topical Apply topically 2 (two) times daily as needed. For eczema      BP 129/66  Pulse 99  Temp(Src) 97.8 F (36.6 C) (Oral)  Resp 20  SpO2 100%  LMP 08/22/2011  Physical Exam  ED Course  Procedures (including critical care time)  Labs Reviewed - No data to display No results found.   1. Asthma attack     7:43 PM Patient seen and examined. Patient from Pod A on asthma protocol. No fever. She has been using albuterol inhaler frequently.  She did not fill steroids from previous visit.   Vital signs reviewed and are as follows: Filed Vitals:   08/31/11 2223  BP: 134/54  Pulse: 99  Temp: 97.7 F (36.5 C)  Resp: 20   8:10 PM Exam: Gen NAD; Heart RRR, nml S1,S2, no m/r/g; Lungs diffuse expiratory wheezing in all fields, no resp distress; Abd soft, NT, no rebound or guarding; Ext 2+ pedal pulses bilaterally, no edema.  10:47 PM Patient re-evaluated. She states she is feeling much better. Continues moderate wheezing on exam. Asked nurse to ensure peak flow obtained. Will try magnesium as well. Patient is agreeable to additional treatments overnight.   12:21 AM Handoff to Dr. Hyman Hopes who will monitor overnight.   Plan: Q2h nebs overnight. If continuing to improve overnight, can d/c home in morning. Urge patient to fill and take prednisone. Otherwise admit.    MDM  Asthma protocol        Renne Crigler, [place] 09/01/11 [phone]

## 2011-08-31 NOTE — ED Notes (Signed)
Pt st's she was at work and got hot.  Started to have asthma attack

## 2011-08-31 NOTE — ED Notes (Signed)
Resting quietly on stretcher watching TV; no complaints at this time - dinner tray ordered

## 2011-08-31 NOTE — ED Notes (Signed)
RT notified of need for continuous nebs

## 2011-09-01 MED ORDER — ALBUTEROL SULFATE HFA 108 (90 BASE) MCG/ACT IN AERS: 1.0000 | INHALATION_SPRAY | Freq: Four times a day (QID) | RESPIRATORY_TRACT | Status: DC | PRN

## 2011-09-01 MED ORDER — PREDNISONE 50 MG PO TABS: 50.0000 mg | ORAL_TABLET | Freq: Every day | ORAL | Status: DC

## 2011-09-01 NOTE — ED Provider Notes (Signed)
Medical screening examination/treatment/procedure(s) were conducted as a shared visit with non-physician practitioner(s) and myself.  I personally evaluated the patient during the encounter. Asthma. Continued wheezing. Patient was placed on the CTA protocol  Gail Walker. Rubin Payor, MD 09/01/11 1610

## 2011-09-01 NOTE — ED Notes (Signed)
Peak expiratory flow rate: 300.

## 2011-09-01 NOTE — Discharge Instructions (Signed)
Asthma, Adult Asthma is caused by narrowing of the air passages in the lungs. It may be triggered by pollen, dust, animal dander, molds, some foods, respiratory infections, exposure to smoke, exercise, emotional stress or other allergens (things that cause allergic reactions or allergies). Repeat attacks are common. HOME CARE INSTRUCTIONS   Use prescription medications as ordered by your caregiver.   Avoid pollen, dust, animal dander, molds, smoke and other things that cause attacks at home and at work.   You may have fewer attacks if you decrease dust in your home. Electrostatic air cleaners may help.   It may help to replace your pillows or mattress with materials less likely to cause allergies.   Talk to your caregiver about an action plan for managing asthma attacks at home, including, the use of a peak flow meter which measures the severity of your asthma attack. An action plan can help minimize or stop the attack without having to seek medical care.   If you are not on a fluid restriction, drink 8 to 10 glasses of water each day.   Always have a plan prepared for seeking medical attention, including, calling your physician, accessing local emergency care, and calling 911 (in the [place] for a severe attack.   Discuss possible exercise routines with your caregiver.   If animal dander is the cause of asthma, you may need to get rid of pets.  SEEK MEDICAL CARE IF:   You have wheezing and shortness of breath even if taking medicine to prevent attacks.   You have muscle aches, chest pain or thickening of sputum.   Your sputum changes from clear or white to yellow, green, gray, or bloody.   You have any problems that may be related to the medicine you are taking (such as a rash, itching, swelling or trouble breathing).  SEEK IMMEDIATE MEDICAL CARE IF:   Your usual medicines do not stop your wheezing or there is increased coughing and/or shortness of breath.   You have increased  difficulty breathing.   You have a fever.  MAKE SURE YOU:   Understand these instructions.   Will watch your condition.   Will get help right away if you are not doing well or get worse.  Document Released: 04/04/2005 Document Revised: 03/24/2011 Document Reviewed: 11/21/2007 ExitCare Patient Information 2012 [employer].  RESOURCE GUIDE  Dental Problems  Patients with Medicaid:  Family Dentistry                     [facility] [address]                                           [address] Phone:  [phone]                                                   Phone:  [phone]  If unable to pay or uninsured, contact:  [facility] or [facility] to become qualified for the adult dental clinic.  Chronic Pain Problems Contact [facility]  [phone] Patients need to be referred by their primary care doctor.  Insufficient Money for Medicine Contact [employer] Way:  call "211" or [facility] 271-5999.  No Primary Care Doctor Call [facility]  [phone] Other agencies that provide inexpensive medical care    [facility]  [phone]    [facility]  [phone]    [facility]  [phone]    [facility] Clinic  [phone]    [facility]  [phone]    [facility]  [phone]  [facility]  [phone] [facility]  [phone] [facility]   [phone] (emergency services [phone])  Abuse/Neglect [place] Child Abuse Hotline (336) 641-3795 [place] [facility] Abuse Hotline 800-378-5315 (After Hours)  Emergency Shelter [facility] (336) 271-5985  [facility] (336) 275-9566 [facility] (704) 372-4663  MRSA Hotline #:   832-7006    [place] [facility]  [facility] [place] [facility]  [employer]                            [facility] [address] [place]                     [address]         [address]                                               [place]                              [place] Phone:  [phone]                                  Phone:  [phone]                   Phone:  [phone]  [facility] Phone:  [phone]  [place] Child Abuse Hotline (336) 342-1394 (336) 342-3537 (After Hours)  

## 2011-09-05 NOTE — ED Provider Notes (Signed)
Medical screening examination/treatment/procedure(s) were performed by non-physician practitioner and as supervising physician I was immediately available for consultation/collaboration.  Graysen Woodyard R. Avo Schlachter, MD 09/05/11 1606 

## 2011-10-05 ENCOUNTER — Encounter: Payer: Medicaid Other | Admitting: Family Medicine

## 2011-11-11 ENCOUNTER — Encounter: Payer: Self-pay | Admitting: Family Medicine

## 2011-11-11 ENCOUNTER — Ambulatory Visit (INDEPENDENT_AMBULATORY_CARE_PROVIDER_SITE_OTHER): Payer: Medicaid Other | Admitting: Family Medicine

## 2011-11-11 VITALS — BP 121/78 | HR 95 | Temp 97.9°F | Ht 64.0 in | Wt 211.8 lb

## 2011-11-11 DIAGNOSIS — F329 Major depressive disorder, single episode, unspecified: Secondary | ICD-10-CM

## 2011-11-11 MED ORDER — VENLAFAXINE HCL ER 37.5 MG PO CP24: 37.5000 mg | ORAL_CAPSULE | Freq: Every day | ORAL | Status: DC

## 2011-11-11 NOTE — Patient Instructions (Addendum)
You scored higher on depression rather than Bipolar on the scoring system.  I would like to try you on a medication to help with this.   It is called Effexor.  Take 1 pill twice daily.  Come back and see me in 2 weeks so we can see how you're doing at that time.

## 2011-11-14 DIAGNOSIS — F329 Major depressive disorder, single episode, unspecified: Secondary | ICD-10-CM | POA: Insufficient documentation

## 2011-11-14 NOTE — Progress Notes (Signed)
Patient ID: Gail Walker, female   DOB: [birth year], 21 y.o.   MRN: [id number] Gail Walker is a 21 y.o. female who presents to [facility] today for concern for bipolar disorder:  1.  Concern for bipolar:  Patient with concern that she has bipolar disorder.  States that she has multiple mood swings throughout the day.  Describes overall mood as being depressed.  Denies any time periods of increased promiscuity, spending sprees, staying up late at night or compulsive behaviors.  Sleeps much of the day.  Depression has worsened since she had child 4 months ago.  Denies any suicidal or homicidal ideations.  No hallucinations.    The following portions of the patient's history were reviewed and updated as appropriate: allergies, current medications, past medical history, family and social history, and problem list.  Patient is a nonsmoker.   ROS as above otherwise neg. No Chest pain, palpitations, SOB, Fever, Chills, Abd pain, N/V/D.  Medications reviewed. Current Outpatient Prescriptions  Medication Sig Dispense Refill  . albuterol (PROVENTIL HFA;VENTOLIN HFA) 108 (90 BASE) MCG/ACT inhaler Inhale 2 puffs into the lungs every 6 (six) hours as needed. For shortness of breath.      Marland Kitchen albuterol (PROVENTIL HFA;VENTOLIN HFA) 108 (90 BASE) MCG/ACT inhaler Inhale 1-2 puffs into the lungs every 6 (six) hours as needed for wheezing.  1 Inhaler  5  . clindamycin-benzoyl peroxide (BENZACLIN) gel Apply 1 application topically 2 (two) times daily.      . predniSONE (DELTASONE) 10 MG tablet Take 20 mg by mouth daily.      . predniSONE (DELTASONE) 50 MG tablet Take 1 tablet (50 mg total) by mouth daily.  5 tablet  0  . triamcinolone ointment (KENALOG) 0.1 % Apply topically 2 (two) times daily as needed. For eczema      . venlafaxine XR (EFFEXOR XR) 37.5 MG 24 hr capsule Take 1 capsule (37.5 mg total) by mouth daily.  60 capsule  1    Exam:  BP 121/78  Pulse 95  Temp 97.9 F (36.6 C) (Oral)  Ht 5\' 4"  (1.626 m)   Wt 211 lb 12.8 oz (96.072 kg)  BMI 36.36 kg/m2 Gen: Well NAD HEENT: EOMI,  MMM Neck:  No thyromegaly Lungs: CTABL Nl WOB Heart: RRR no MRG Abd: NABS, NT, ND Psych:  Not depression, anxious, or manic appearing today.  Linear thought process.  No results found for this or any previous visit (from the past 72 hour(s)).

## 2011-11-14 NOTE — Assessment & Plan Note (Signed)
Scored 17 on PHQ-9.  Did not qualify for bipolar disorder on MDQ. Plan to treat with Effexor at low dose and FU in 2 weeks.  May need increased dosage at that time.   Declines counseling at this time as well.  No red flags based on history and physical, caring for child well as he is gaining good weight and doing well by developmental milestones.

## 2011-11-28 ENCOUNTER — Ambulatory Visit: Payer: Medicaid Other | Admitting: Family Medicine

## 2012-02-28 ENCOUNTER — Other Ambulatory Visit (HOSPITAL_COMMUNITY)
Admission: RE | Admit: 2012-02-28 | Discharge: 2012-02-28 | Disposition: A | Discharge status: Home / Self Care | Payer: Medicaid Other | Source: Ambulatory Visit | Attending: Family Medicine | Admitting: Family Medicine

## 2012-02-28 ENCOUNTER — Ambulatory Visit (INDEPENDENT_AMBULATORY_CARE_PROVIDER_SITE_OTHER): Payer: Medicaid Other | Admitting: Family Medicine

## 2012-02-28 ENCOUNTER — Encounter: Payer: Self-pay | Admitting: Family Medicine

## 2012-02-28 VITALS — BP 111/78 | HR 89 | Ht 64.0 in | Wt 213.0 lb

## 2012-02-28 DIAGNOSIS — Z7251 High risk heterosexual behavior: Secondary | ICD-10-CM

## 2012-02-28 DIAGNOSIS — Z202 Contact with and (suspected) exposure to infections with a predominantly sexual mode of transmission: Secondary | ICD-10-CM | POA: Insufficient documentation

## 2012-02-28 DIAGNOSIS — Z113 Encounter for screening for infections with a predominantly sexual mode of transmission: Secondary | ICD-10-CM | POA: Insufficient documentation

## 2012-02-28 LAB — POCT WET PREP (WET MOUNT): Clue Cells Wet Prep Whiff POC: POSITIVE

## 2012-02-28 MED ORDER — CEFTRIAXONE SODIUM 250 MG IJ SOLR: 125.0000 mg | Freq: Once | INTRAMUSCULAR | Status: DC

## 2012-02-28 MED ORDER — AZITHROMYCIN 1 G PO PACK
1.0000 g | PACK | Freq: Once | ORAL | Status: AC
  Administered 2012-02-28: 1 g via ORAL

## 2012-02-28 MED ORDER — CEFTRIAXONE SODIUM 250 MG IJ SOLR
250.0000 mg | Freq: Once | INTRAMUSCULAR | Status: AC
  Administered 2012-02-28: 250 mg via INTRAMUSCULAR

## 2012-02-28 NOTE — Assessment & Plan Note (Signed)
Will check GC/chlamydia and wet prep. Azithromycin and CTX given in clinic today - due to hx of ex-boyfriend with dx Gonorrhea. Will notify patient of results. Red flags reviewed with patient and per after visit summary.

## 2012-02-28 NOTE — Progress Notes (Signed)
Patient ID: Gail Walker, female   DOB: [birth year], 21 y.o.   MRN: [id number]  Patient seen and examined independently.  Agree with findings, assessment, and plan as outlined by MS-IV.  My additional findings are documented below.  Briefly, patient was told her by an ex-boyfriend that he has Gonorrhea.  He told her this yesterday.  She does complain of white, thick discharge, but denies any vaginal bleeding, pelvic pain, or low back pain.  Denies any associated fever, chills, NS, nausea/vomiting.  PE: General: pleasant, cooperative, in NAD Abdomen: soft, NT, ND, + BS Pelvic: performed by MS-IV, vaginal discharge present, normal uterus, no tenderness on exam, no erythema or injury to labia Skin: no rash or erythema  A/P: See Problem List  Gail [place], DO 02/28/2012 4:27 PM

## 2012-02-28 NOTE — Patient Instructions (Addendum)
It was good to see you today, Gail Walker. We will treat you in our clinic today for Gonorrhea and Chlamydia. If anything comes back on positive on the wet prep, we will call you. If you develop fever, chills, NS, nausea/vomiting, or pelvic pain, please call doctor or return to clinic. Use condoms from now on to protect yourself from STD.  Sexually Transmitted Disease A sexually transmitted disease (STD) is an infection that is passed from person to person during sexual activity. STDs can be spread by different types of germs (bacteria, viruses, parasites). An STD can be passed through:  Spit (saliva).  Semen.  Blood.  Mucus from the vagina.  Pee (urine). HOME CARE   Tell your sex partner(s) that you have an STD. They should be tested and treated.  Take your medicine (antibiotics) as told. Finish them even if you start to feel better.  Only take medicines as told by your doctor.  Rest.  Eat a healthy diet. Drink enough fluids to keep your pee clear or pale yellow.  Do not have sex until treatment is finished. You must follow up with your doctor.  Keep all doctor visits, Pap tests, and blood tests as told by your doctor.  Only use condoms labeled "latex" and lubricants that wash away with water (water-soluble). Do not use petroleum jelly or oils.  Avoid alcohol and illegal drugs.  Get shots (vaccines) for HPV and hepatitis.  Avoid risky sex behavior that can break the skin. GET HELP RIGHT AWAY IF:  You have a fever.  You have new problems, or your problems get worse. MAKE SURE YOU:  Understand these instructions.  Will watch your condition.  Will get help right away if you are not doing well or get worse. Document Released: 05/12/2004 Document Revised: 06/27/2011 Document Reviewed: 08/02/2010 [facility] Patient Information 2013 [place], [place].

## 2012-02-28 NOTE — Progress Notes (Signed)
Subjective:     Patient ID: Gail Walker, female   DOB: 03-30-1991, 21 y.o.   MRN: [id number]  HPI  Ms. Dieckman is a 21 yo female presenting for an STD check. Her most recent sexual partner informed her that he had tested positive for gonorrhea. She admits to an increasing thick, white discharge that has a foul odor she noticed a week ago. Has been treated for chlamydia before. Denies any other sexual partners around the same time besides this most recent one. She would like to be tested for gonorrhea, chlamydia and trichomonas today. Denies any abdominal pain.   Review of Systems As noted above in HPI.     Objective:   Physical Exam BP 111/78  Pulse 89  Ht 5\' 4"  (1.626 m)  Wt 213 lb (96.616 kg)  BMI 36.56 kg/m2 General appearance: alert, cooperative and no distress Lungs: clear to auscultation bilaterally Heart: regular rate and rhythm, S1, S2 normal, no murmur, click, rub or gallop Abdomen: soft, nontender, nondistended Pelvic: external genitalia normal and vagina normal with thick white discharge on vaginal walls Extremities: extremities normal, atraumatic, no cyanosis or edema Skin: Skin color, texture, turgor normal. No rashes or lesions    Assessment:     Possible exposure to STDs    Plan:     -Gonorrhea,chlamydia, trichomonas testing. Will call pt with results.  -Empiric treatment with Rocephin 125 mg IM and Azithromycin 1 g.

## 2012-02-29 ENCOUNTER — Telehealth: Payer: Self-pay | Admitting: Family Medicine

## 2012-02-29 NOTE — Telephone Encounter (Signed)
Please call patient and let her know that she did test positive for Gonorrhea.  She was treated in clinic. She also tested positive for bacterial vaginosis.  Please inform patient Flagyl sent to pharmacy. Please [facility] to [facility].  Thanks.

## 2012-03-01 NOTE — Telephone Encounter (Signed)
Pt called back and info was given

## 2012-03-01 NOTE — Telephone Encounter (Signed)
LVM for patient to call back to inform of below 

## 2012-03-02 NOTE — Telephone Encounter (Signed)
Communicable Disease report report faxed to [facility]

## 2012-03-06 ENCOUNTER — Encounter: Payer: Medicaid Other | Admitting: Family Medicine

## 2012-04-03 ENCOUNTER — Encounter (HOSPITAL_COMMUNITY): Payer: Self-pay | Admitting: Emergency Medicine

## 2012-04-03 ENCOUNTER — Emergency Department (HOSPITAL_COMMUNITY)
Admission: EM | Admit: 2012-04-03 | Discharge: 2012-04-03 | Disposition: A | Discharge status: Home / Self Care | Payer: Medicaid Other | Attending: Emergency Medicine | Admitting: Emergency Medicine

## 2012-04-03 ENCOUNTER — Emergency Department (HOSPITAL_COMMUNITY): Payer: Medicaid Other

## 2012-04-03 DIAGNOSIS — Z79899 Other long term (current) drug therapy: Secondary | ICD-10-CM | POA: Insufficient documentation

## 2012-04-03 DIAGNOSIS — Y92410 Unspecified street and highway as the place of occurrence of the external cause: Secondary | ICD-10-CM | POA: Insufficient documentation

## 2012-04-03 DIAGNOSIS — S335XXA Sprain of ligaments of lumbar spine, initial encounter: Secondary | ICD-10-CM | POA: Insufficient documentation

## 2012-04-03 DIAGNOSIS — S139XXA Sprain of joints and ligaments of unspecified parts of neck, initial encounter: Secondary | ICD-10-CM | POA: Insufficient documentation

## 2012-04-03 DIAGNOSIS — S161XXA Strain of muscle, fascia and tendon at neck level, initial encounter: Secondary | ICD-10-CM

## 2012-04-03 DIAGNOSIS — L708 Other acne: Secondary | ICD-10-CM | POA: Insufficient documentation

## 2012-04-03 DIAGNOSIS — Z87891 Personal history of nicotine dependence: Secondary | ICD-10-CM | POA: Insufficient documentation

## 2012-04-03 DIAGNOSIS — IMO0002 Reserved for concepts with insufficient information to code with codable children: Secondary | ICD-10-CM | POA: Insufficient documentation

## 2012-04-03 DIAGNOSIS — R42 Dizziness and giddiness: Secondary | ICD-10-CM | POA: Insufficient documentation

## 2012-04-03 DIAGNOSIS — Y9389 Activity, other specified: Secondary | ICD-10-CM | POA: Insufficient documentation

## 2012-04-03 DIAGNOSIS — J45909 Unspecified asthma, uncomplicated: Secondary | ICD-10-CM | POA: Insufficient documentation

## 2012-04-03 DIAGNOSIS — Z8719 Personal history of other diseases of the digestive system: Secondary | ICD-10-CM | POA: Insufficient documentation

## 2012-04-03 DIAGNOSIS — R51 Headache: Secondary | ICD-10-CM

## 2012-04-03 DIAGNOSIS — S39012A Strain of muscle, fascia and tendon of lower back, initial encounter: Secondary | ICD-10-CM

## 2012-04-03 MED ORDER — CYCLOBENZAPRINE HCL 10 MG PO TABS: 10.0000 mg | ORAL_TABLET | Freq: Two times a day (BID) | ORAL | Status: DC | PRN

## 2012-04-03 MED ORDER — HYDROCODONE-ACETAMINOPHEN 5-325 MG PO TABS: 1.0000 | ORAL_TABLET | Freq: Four times a day (QID) | ORAL | Status: DC | PRN

## 2012-04-03 MED ORDER — OXYCODONE-ACETAMINOPHEN 5-325 MG PO TABS
2.0000 | ORAL_TABLET | Freq: Once | ORAL | Status: AC
  Administered 2012-04-03: 2 via ORAL
  Filled 2012-04-03: qty 2

## 2012-04-03 MED ORDER — CYCLOBENZAPRINE HCL 10 MG PO TABS
10.0000 mg | ORAL_TABLET | Freq: Once | ORAL | Status: AC
  Administered 2012-04-03: 10 mg via ORAL
  Filled 2012-04-03: qty 1
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
## 2012-04-03 NOTE — ED Provider Notes (Signed)
Medical screening examination/treatment/procedure(s) were performed by non-physician practitioner and as supervising physician I was immediately available for consultation/collaboration.  Gordie Belvin, MD 04/03/12 2352 
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
## 2012-04-19 ENCOUNTER — Encounter: Payer: Medicaid Other | Admitting: Family Medicine

## 2012-04-23 ENCOUNTER — Other Ambulatory Visit: Payer: Self-pay | Admitting: Family Medicine

## 2012-04-23 NOTE — Telephone Encounter (Signed)
Pt never was able to pick up med for the BV - wants to know if she can get another called in for her.  Also was waiting on refill for cream for her eczema... [facility] [address]

## 2012-04-23 NOTE — Telephone Encounter (Addendum)
C/o vaginal odor and itching.  Denies any discharge or burning.  Has recurrent BV that may be related to her soap.  Patient did not pick up the last Rx for BV.  Per Epic note on ---last Rx for metronidazole was 02/29/2012  and patient was also positive for gonorrhea.  Patient did not pick up metronidazole Rx, but was treated in office for STD.  Informed patient that she will need office visit to be evaluated for symptoms.  Dr. Gwendolyn Grant not available tomorrow.  Appt scheduled for tomorrow afternoon at 3:15 pm with Dr. Berline Chough.  Gaylene Brooks, RN

## 2012-04-24 ENCOUNTER — Encounter: Payer: Self-pay | Admitting: Sports Medicine

## 2012-04-24 ENCOUNTER — Other Ambulatory Visit (HOSPITAL_COMMUNITY)
Admission: RE | Admit: 2012-04-24 | Discharge: 2012-04-24 | Disposition: A | Discharge status: Home / Self Care | Payer: Medicaid Other | Source: Ambulatory Visit | Attending: Family Medicine | Admitting: Family Medicine

## 2012-04-24 ENCOUNTER — Ambulatory Visit (INDEPENDENT_AMBULATORY_CARE_PROVIDER_SITE_OTHER): Payer: Medicaid Other | Admitting: Sports Medicine

## 2012-04-24 VITALS — BP 114/92 | HR 85 | Temp 97.8°F | Wt 215.9 lb

## 2012-04-24 DIAGNOSIS — Z113 Encounter for screening for infections with a predominantly sexual mode of transmission: Secondary | ICD-10-CM | POA: Insufficient documentation

## 2012-04-24 DIAGNOSIS — N760 Acute vaginitis: Secondary | ICD-10-CM

## 2012-04-24 DIAGNOSIS — Z01419 Encounter for gynecological examination (general) (routine) without abnormal findings: Secondary | ICD-10-CM | POA: Insufficient documentation

## 2012-04-24 DIAGNOSIS — Z124 Encounter for screening for malignant neoplasm of cervix: Secondary | ICD-10-CM

## 2012-04-24 DIAGNOSIS — Z309 Encounter for contraceptive management, unspecified: Secondary | ICD-10-CM

## 2012-04-24 DIAGNOSIS — IMO0001 Reserved for inherently not codable concepts without codable children: Secondary | ICD-10-CM

## 2012-04-24 DIAGNOSIS — N898 Other specified noninflammatory disorders of vagina: Secondary | ICD-10-CM

## 2012-04-24 DIAGNOSIS — Z0000 Encounter for general adult medical examination without abnormal findings: Secondary | ICD-10-CM

## 2012-04-24 LAB — POCT WET PREP (WET MOUNT): Clue Cells Wet Prep Whiff POC: NEGATIVE

## 2012-04-24 NOTE — Patient Instructions (Addendum)
It was nice to see you today.   Today we discussed: 1. Vaginitis and vulvovaginitis, unspecified  You have normal physiologic discharge.  There is no evidence of infection   - POCT [facility])  2. Screening for malignant neoplasm of the cervix  I will send a letter with your results   - Cytology - PAP   Please plan to return to see Dr. Gwendolyn Grant for contraception (birth control).  If you need anything prior to seeing me please call the clinic.  Please Bring all medications with you to each appointment.   MIRENA Levonorgestrel intrauterine device (IUD) What is this medicine? LEVONORGESTREL IUD (LEE voe nor jes trel) is a contraceptive (birth control) device. It is used to prevent pregnancy and to treat heavy bleeding that occurs during your period. It can be used for up to 5 years. This medicine may be used for other purposes; ask your health care provider or pharmacist if you have questions. What should I tell my health care provider before I take this medicine? They need to know if you have any of these conditions: -abnormal Pap smear -cancer of the breast, uterus, or cervix -diabetes -endometritis -genital or pelvic infection now or in the past -have more than one sexual partner or your partner has more than one partner -heart disease -history of an ectopic or tubal pregnancy -immune system problems -IUD in place -liver disease or tumor -problems with blood clots or take blood-thinners -use intravenous drugs -uterus of unusual shape -vaginal bleeding that has not been explained -an unusual or allergic reaction to levonorgestrel, other hormones, silicone, or polyethylene, medicines, foods, dyes, or preservatives -pregnant or trying to get pregnant -breast-feeding How should I use this medicine? This device is placed inside the uterus by a health care professional. Talk to your pediatrician regarding the use of this medicine in children. Special care may be  needed. Overdosage: If you think you have taken too much of this medicine contact a poison control center or emergency room at once. NOTE: This medicine is only for you. Do not share this medicine with others. What if I miss a dose? This does not apply. What may interact with this medicine? Do not take this medicine with any of the following medications: -amprenavir -bosentan -fosamprenavir This medicine may also interact with the following medications: -aprepitant -barbiturate medicines for inducing sleep or treating seizures -bexarotene -griseofulvin -medicines to treat seizures like carbamazepine, ethotoin, felbamate, oxcarbazepine, phenytoin, topiramate -modafinil -pioglitazone -rifabutin -rifampin -rifapentine -some medicines to treat HIV infection like atazanavir, indinavir, lopinavir, nelfinavir, tipranavir, ritonavir -St. John's wort -warfarin This list may not describe all possible interactions. Give your health care provider a list of all the medicines, herbs, non-prescription drugs, or dietary supplements you use. Also tell them if you smoke, drink alcohol, or use illegal drugs. Some items may interact with your medicine. What should I watch for while using this medicine? Visit your doctor or health care professional for regular check ups. See your doctor if you or your partner has sexual contact with others, becomes HIV positive, or gets a sexual transmitted disease. This product does not protect you against HIV infection (AIDS) or other sexually transmitted diseases. You can check the placement of the IUD yourself by reaching up to the top of your vagina with clean fingers to feel the threads. Do not pull on the threads. It is a good habit to check placement after each menstrual period. Call your doctor right away if you feel more  of the IUD than just the threads or if you cannot feel the threads at all. The IUD may come out by itself. You may become pregnant if the device  comes out. If you notice that the IUD has come out use a backup birth control method like condoms and call your health care provider. Using tampons will not change the position of the IUD and are okay to use during your period. What side effects may I notice from receiving this medicine? Side effects that you should report to your doctor or health care professional as soon as possible: -allergic reactions like skin rash, itching or hives, swelling of the face, lips, or tongue -fever, flu-like symptoms -genital sores -high blood pressure -no menstrual period for 6 weeks during use -pain, swelling, warmth in the leg -pelvic pain or tenderness -severe or sudden headache -signs of pregnancy -stomach cramping -sudden shortness of breath -trouble with balance, talking, or walking -unusual vaginal bleeding, discharge -yellowing of the eyes or skin Side effects that usually do not require medical attention (report to your doctor or health care professional if they continue or are bothersome): -acne -breast pain -change in sex drive or performance -changes in weight -cramping, dizziness, or faintness while the device is being inserted -headache -irregular menstrual bleeding within first 3 to 6 months of use -nausea This list may not describe all possible side effects. Call your doctor for medical advice about side effects. You may report side effects to FDA at 1-800-FDA-1088. Where should I keep my medicine? This does not apply. NOTE: This sheet is a summary. It may not cover all possible information. If you have questions about this medicine, talk to your doctor, pharmacist, or health care provider.  2012, Elsevier/Gold Standard. (04/25/2008 6:39:08 PM)   Etonogestrel implant NEXPLANON What is this medicine? ETONOGESTREL is a contraceptive (birth control) device. It is used to prevent pregnancy. It can be used for up to 3 years. This medicine may be used for other purposes; ask your  health care provider or pharmacist if you have questions. What should I tell my health care provider before I take this medicine? They need to know if you have any of these conditions: -abnormal vaginal bleeding -blood vessel disease or blood clots -cancer of the breast, cervix, or liver -depression -diabetes -gallbladder disease -headaches -heart disease or recent heart attack -high blood pressure -high cholesterol -kidney disease -liver disease -renal disease -seizures -tobacco smoker -an unusual or allergic reaction to etonogestrel, other hormones, anesthetics or antiseptics, medicines, foods, dyes, or preservatives -pregnant or trying to get pregnant -breast-feeding How should I use this medicine? This device is inserted just under the skin on the inner side of your upper arm by a health care professional. Talk to your pediatrician regarding the use of this medicine in children. Special care may be needed. Overdosage: If you think you've taken too much of this medicine contact a poison control center or emergency room at once. Overdosage: If you think you have taken too much of this medicine contact a poison control center or emergency room at once. NOTE: This medicine is only for you. Do not share this medicine with others. What if I miss a dose? This does not apply. What may interact with this medicine? Do not take this medicine with any of the following medications: -amprenavir -bosentan -fosamprenavir This medicine may also interact with the following medications: -barbiturate medicines for inducing sleep or treating seizures -certain medicines for fungal infections like ketoconazole and itraconazole -griseofulvin -  medicines to treat seizures like carbamazepine, felbamate, oxcarbazepine, phenytoin, topiramate -modafinil -phenylbutazone -rifampin -some medicines to treat HIV infection like atazanavir, indinavir, lopinavir, nelfinavir, tipranavir, ritonavir -St.  John's wort This list may not describe all possible interactions. Give your health care provider a list of all the medicines, herbs, non-prescription drugs, or dietary supplements you use. Also tell them if you smoke, drink alcohol, or use illegal drugs. Some items may interact with your medicine. What should I watch for while using this medicine? This product does not protect you against HIV infection (AIDS) or other sexually transmitted diseases. You should be able to feel the implant by pressing your fingertips over the skin where it was inserted. Tell your doctor if you cannot feel the implant. What side effects may I notice from receiving this medicine? Side effects that you should report to your doctor or health care professional as soon as possible: -allergic reactions like skin rash, itching or hives, swelling of the face, lips, or tongue -breast lumps -changes in vision -confusion, trouble speaking or understanding -dark urine -depressed mood -general ill feeling or flu-like symptoms -light-colored stools -loss of appetite, nausea -right upper belly pain -severe headaches -severe pain, swelling, or tenderness in the abdomen -shortness of breath, chest pain, swelling in a leg -signs of pregnancy -sudden numbness or weakness of the face, arm or leg -trouble walking, dizziness, loss of balance or coordination -unusual vaginal bleeding, discharge -unusually weak or tired -yellowing of the eyes or skin Side effects that usually do not require medical attention (Report these to your doctor or health care professional if they continue or are bothersome.): -acne -breast pain -changes in weight -cough -fever or chills -headache -irregular menstrual bleeding -itching, burning, and vaginal discharge -pain or difficulty passing urine -sore throat This list may not describe all possible side effects. Call your doctor for medical advice about side effects. You may report side effects  to FDA at 1-800-FDA-1088. Where should I keep my medicine? This drug is given in a hospital or clinic and will not be stored at home. NOTE: This sheet is a summary. It may not cover all possible information. If you have questions about this medicine, talk to your doctor, pharmacist, or health care provider.  2013, Elsevier/Gold Standard. (12/26/2008 3:54:17 PM)

## 2012-04-26 ENCOUNTER — Encounter: Payer: Self-pay | Admitting: Sports Medicine

## 2012-04-27 DIAGNOSIS — N898 Other specified noninflammatory disorders of vagina: Secondary | ICD-10-CM | POA: Insufficient documentation

## 2012-04-27 DIAGNOSIS — Z309 Encounter for contraceptive management, unspecified: Secondary | ICD-10-CM | POA: Insufficient documentation

## 2012-04-27 DIAGNOSIS — Z0000 Encounter for general adult medical examination without abnormal findings: Secondary | ICD-10-CM | POA: Insufficient documentation

## 2012-04-27 NOTE — Progress Notes (Signed)
  [facility]  Patient name: Gail Walker MRN [id number]  Date of birth: 1990-09-20  CC & HPI:  Gail Walker is a 22 y.o. female presenting today for evaluation of:  #vaginal discharge: discharge X 2 weeks, white.  Started 1 week prior to LMP.  Denies odor, discharge, dysuria.  Does not douche.    # discuss contraception: not currently using contraception at all.  Sexually active.  Does not wish to become pregnant.    ROS:  No fevers, chills, abdominal pain  Pertinent History Reviewed:  Medical & Surgical Hx:  Reviewed: Significant for obesity, hx of acne, prior STDs Medications: Reviewed & Updated - see associated section Social History: Reviewed -  reports that she quit smoking about 13 months ago. She has never used smokeless tobacco.   Objective Findings:  Vitals:  Filed Vitals:   04/24/12 1539  BP: 114/92  Pulse: 85  Temp: 97.8 F (36.6 C)    PE: GENERAL:  Young adult obese AA female. In no discomfort; no respiratory distress. Pelvic exam: VULVA: normal appearing vulva with no masses, tenderness or lesions, VAGINA: normal appearing vagina with normal color and discharge, no lesions, CERVIX: normal appearing cervix without discharge or lesions, UTERUS: uterus is normal size, shape, consistency and nontender, ADNEXA: normal adnexa in size, nontender and no masses, PAP: Pap smear done today, WET MOUNT done - results: negative for pathogens, normal epithelial cells, DNA probe for chlamydia and GC obtained.   Assessment & Plan:

## 2012-04-27 NOTE — Assessment & Plan Note (Signed)
Discussed long term forms of birth control and risk of pregnancy with out formal contraception Leaning towards NExplanon States she will follow up with Dr. Gwendolyn Grant, informed to let front office know to plan for device

## 2012-04-27 NOTE — Assessment & Plan Note (Signed)
Due for routine PAP in 1 month.  Will obtain PAP today since exam being performed.  Pt reported hx of abnormal PAP but none noted in documentation.

## 2012-04-27 NOTE — Assessment & Plan Note (Signed)
Normal vaginal discharge.  GC chlamydia sent.   Discussed normal physiologic discharge >follow up prn

## 2012-04-30 ENCOUNTER — Telehealth: Payer: Self-pay | Admitting: Family Medicine

## 2012-04-30 NOTE — Telephone Encounter (Signed)
Called and informed pt that all of her results were neg.Gail Walker

## 2012-04-30 NOTE — Telephone Encounter (Signed)
Patient is calling for results of her Pap Smear.

## 2012-07-14 ENCOUNTER — Inpatient Hospital Stay (HOSPITAL_COMMUNITY)
Admission: AD | Admit: 2012-07-14 | Discharge: 2012-07-14 | Disposition: A | Discharge status: Home / Self Care | Payer: Medicaid Other | Source: Ambulatory Visit | Attending: Obstetrics & Gynecology | Admitting: Obstetrics & Gynecology

## 2012-07-14 NOTE — MAU Note (Signed)
NOT IN LOBBY 

## 2012-08-13 ENCOUNTER — Other Ambulatory Visit: Payer: Self-pay | Admitting: Family Medicine

## 2012-09-03 ENCOUNTER — Other Ambulatory Visit: Payer: Self-pay | Admitting: *Deleted

## 2012-09-03 MED ORDER — ALBUTEROL SULFATE HFA 108 (90 BASE) MCG/ACT IN AERS: 2.0000 | INHALATION_SPRAY | Freq: Four times a day (QID) | RESPIRATORY_TRACT | Status: DC | PRN

## 2012-09-03 NOTE — Telephone Encounter (Signed)
Requested Prescriptions   Pending Prescriptions Disp Refills  . albuterol (PROVENTIL HFA;VENTOLIN HFA) 108 (90 BASE) MCG/ACT inhaler      Sig: Inhale 2 puffs into the lungs every 6 (six) hours as needed. For shortness of breath.

## 2012-10-05 ENCOUNTER — Ambulatory Visit: Payer: Medicaid Other | Admitting: Family Medicine

## 2012-11-03 ENCOUNTER — Emergency Department (HOSPITAL_COMMUNITY)
Admission: EM | Admit: 2012-11-03 | Discharge: 2012-11-03 | Disposition: A | Discharge status: Home / Self Care | Payer: Medicaid Other | Attending: Emergency Medicine | Admitting: Emergency Medicine

## 2012-11-03 ENCOUNTER — Encounter (HOSPITAL_COMMUNITY): Payer: Self-pay

## 2012-11-03 ENCOUNTER — Emergency Department (HOSPITAL_COMMUNITY): Payer: Medicaid Other

## 2012-11-03 DIAGNOSIS — IMO0002 Reserved for concepts with insufficient information to code with codable children: Secondary | ICD-10-CM | POA: Insufficient documentation

## 2012-11-03 DIAGNOSIS — L03119 Cellulitis of unspecified part of limb: Secondary | ICD-10-CM

## 2012-11-03 DIAGNOSIS — R609 Edema, unspecified: Secondary | ICD-10-CM | POA: Insufficient documentation

## 2012-11-03 DIAGNOSIS — Z87891 Personal history of nicotine dependence: Secondary | ICD-10-CM | POA: Insufficient documentation

## 2012-11-03 DIAGNOSIS — K219 Gastro-esophageal reflux disease without esophagitis: Secondary | ICD-10-CM | POA: Insufficient documentation

## 2012-11-03 DIAGNOSIS — Z79899 Other long term (current) drug therapy: Secondary | ICD-10-CM | POA: Insufficient documentation

## 2012-11-03 DIAGNOSIS — J45909 Unspecified asthma, uncomplicated: Secondary | ICD-10-CM | POA: Insufficient documentation

## 2012-11-03 MED ORDER — CLINDAMYCIN HCL 150 MG PO CAPS: 300.0000 mg | ORAL_CAPSULE | Freq: Three times a day (TID) | ORAL | Status: DC

## 2012-11-03 MED ORDER — TRAMADOL HCL 50 MG PO TABS
50.0000 mg | ORAL_TABLET | Freq: Once | ORAL | Status: AC
  Administered 2012-11-03: 50 mg via ORAL
  Filled 2012-11-03: qty 1

## 2012-11-03 MED ORDER — TRAMADOL HCL 50 MG PO TABS: 50.0000 mg | ORAL_TABLET | Freq: Four times a day (QID) | ORAL | Status: DC | PRN

## 2012-11-03 NOTE — ED Provider Notes (Signed)
History    CSN: [id number] Arrival date & time 11/03/12  0919  First MD Initiated Contact with Patient 11/03/12 [phone]     Chief Complaint  Patient presents with  . Wrist Pain   (Consider location/radiation/quality/duration/timing/severity/associated sxs/prior Treatment) HPI Comments: Patient is a 22 year old female who presents with right wrist pain since yesterday. Symptoms started gradually and progressively worsened since the onset. The pain is throbbing and severe without radiation. Patient denies any injury. Movement makes the pain worse. Nothing makes the pain better. Patient reports some associated edema. Patient has not tried anything for pain.   Patient is a 22 y.o. female presenting with wrist pain.  Wrist Pain Associated symptoms include arthralgias and joint swelling.   Past Medical History  Diagnosis Date  . Chlamydia 2012  . Acne   . Asthma     uses albuterol inhaler daily  . GERD (gastroesophageal reflux disease)     during pregnancy - takes zantac   Past Surgical History  Procedure Laterality Date  . Pregnancy termination    . No past surgeries     Family History  Problem Relation Age of Onset  . Anesthesia problems Neg Hx    History  Substance Use Topics  . Smoking status: Former Smoker -- 0.25 packs/day    Quit date: 03/03/2011  . Smokeless tobacco: Never Used  . Alcohol Use: No   OB History   Grav Para Term Preterm Abortions TAB SAB Ect Mult Living   2 1 1  0 1 1 0 0 0 1     Review of Systems  Musculoskeletal: Positive for joint swelling and arthralgias.  All other systems reviewed and are negative.    Allergies  Review of patient's allergies indicates no known allergies.  Home Medications   Current Outpatient Rx  Name  Route  Sig  Dispense  Refill  . albuterol (PROVENTIL HFA;VENTOLIN HFA) 108 (90 BASE) MCG/ACT inhaler   Inhalation   Inhale 2 puffs into the lungs every 6 (six) hours as needed. For shortness of breath.   8.5 g   1     BP 138/75  Pulse 89  Temp(Src) 98.1 F (36.7 C) (Oral)  Resp 16  SpO2 99%  LMP 10/20/2012 Physical Exam  Nursing note and vitals reviewed. Constitutional: She is oriented to person, place, and time. She appears well-developed and well-nourished. No distress.  HENT:  Head: Normocephalic and atraumatic.  Eyes: Conjunctivae are normal.  Neck: Normal range of motion.  Cardiovascular: Normal rate and regular rhythm.  Exam reveals no gallop and no friction rub.   No murmur heard. Pulmonary/Chest: Effort normal and breath sounds normal. She has no wheezes. She has no rales. She exhibits no tenderness.  Abdominal: Soft. She exhibits no distension. There is no tenderness. There is no rebound and no guarding.  Musculoskeletal:  Right wrist ROM limited due to pain and mild edema.  No obvious deformity. Right wrist warm to touch with some erythema.   Neurological: She is alert and oriented to person, place, and time. Coordination normal.  Speech is goal-oriented. Moves limbs without ataxia.   Skin: Skin is warm and dry.  Psychiatric: She has a normal mood and affect. Her behavior is normal.    ED Course  Procedures (including critical care time) Labs Reviewed - No data to display Dg Wrist Complete Right  11/03/2012   *RADIOLOGY REPORT*  Clinical Data: Wrist pain  RIGHT WRIST - COMPLETE 3+ VIEW  Comparison: None.  Findings: There  is no evidence of fracture or dislocation.  There is no evidence of arthropathy or other focal bone abnormality. Soft tissues are unremarkable.  IMPRESSION: Negative exam.   Original Report Authenticated By: Signa Kell, M.D.   1. Cellulitis of wrist     MDM  10:15 AM Xray unremarkable for acute changes. Vitals stable and patient afebrile. Patient will be treated for cellulitis. Patient instructed to return with worsening or concerning symptoms.   Emilia Beck, PA-C 11/03/12 1526

## 2012-11-03 NOTE — ED Notes (Signed)
She c/o non-traumatic right wrist pain since yesterday.  She is in no distress.

## 2012-11-04 ENCOUNTER — Other Ambulatory Visit: Payer: Self-pay | Admitting: Family Medicine

## 2012-11-04 ENCOUNTER — Emergency Department (HOSPITAL_COMMUNITY)
Admission: EM | Admit: 2012-11-04 | Discharge: 2012-11-04 | Disposition: A | Discharge status: Home / Self Care | Payer: Medicaid Other | Attending: Emergency Medicine | Admitting: Emergency Medicine

## 2012-11-04 ENCOUNTER — Encounter (HOSPITAL_COMMUNITY): Payer: Self-pay | Admitting: Nurse Practitioner

## 2012-11-04 DIAGNOSIS — M109 Gout, unspecified: Secondary | ICD-10-CM | POA: Insufficient documentation

## 2012-11-04 DIAGNOSIS — K219 Gastro-esophageal reflux disease without esophagitis: Secondary | ICD-10-CM | POA: Insufficient documentation

## 2012-11-04 DIAGNOSIS — Z872 Personal history of diseases of the skin and subcutaneous tissue: Secondary | ICD-10-CM | POA: Insufficient documentation

## 2012-11-04 DIAGNOSIS — Z87891 Personal history of nicotine dependence: Secondary | ICD-10-CM | POA: Insufficient documentation

## 2012-11-04 DIAGNOSIS — Z8619 Personal history of other infectious and parasitic diseases: Secondary | ICD-10-CM | POA: Insufficient documentation

## 2012-11-04 DIAGNOSIS — Z79899 Other long term (current) drug therapy: Secondary | ICD-10-CM | POA: Insufficient documentation

## 2012-11-04 DIAGNOSIS — J45909 Unspecified asthma, uncomplicated: Secondary | ICD-10-CM | POA: Insufficient documentation

## 2012-11-04 MED ORDER — INDOMETHACIN 25 MG PO CAPS: 25.0000 mg | ORAL_CAPSULE | Freq: Three times a day (TID) | ORAL | Status: DC | PRN

## 2012-11-04 MED ORDER — HYDROCODONE-ACETAMINOPHEN 7.5-325 MG/15ML PO SOLN: 15.0000 mL | Freq: Four times a day (QID) | ORAL | Status: DC | PRN

## 2012-11-04 MED ORDER — COLCHICINE 0.6 MG PO TABS
1.2000 mg | ORAL_TABLET | ORAL | Status: AC
  Administered 2012-11-04: 1.2 mg via ORAL
  Filled 2012-11-04: qty 2

## 2012-11-04 NOTE — ED Notes (Signed)
Vital signs stable and GCS 15. 

## 2012-11-04 NOTE — ED Provider Notes (Signed)
Medical screening examination/treatment/procedure(s) were performed by non-physician practitioner and as supervising physician I was immediately available for consultation/collaboration.   Cambell Rickenbach L Lamia Mariner, MD 11/04/12 0846 

## 2012-11-04 NOTE — ED Provider Notes (Signed)
22 year old female who presents with right wrist pain which she states started 2 days ago. This was spontaneous in onset, gradual in onset, gradually worsened and is associated with mild swelling. She denies any other joint pain, she denies fevers chills nausea or vomiting, on exam she has tenderness of the right wrist to palpation and has minimal range of motion secondary to this tenderness. She does not have any numbness of the fingers, she has full flexion and extension of the fingers with pain that radiates to the wrist but no pain in the hand or the fingers. There is no redness of the joint, no rash over the joint, no tachycardia, normal lung sounds, normal speech.    Patient likely has a gouty arthritis, she is low risk for gonorrhea, she does not have a fever, this does not appear to be a septic arthritis. She has been given very strict instructions, I have told her in detail what she needs to watch for for return precautions and she has expressed her understanding. Pain medications given, patient appears stable for discharge.  Medical screening examination/treatment/procedure(s) were conducted as a shared visit with non-physician practitioner(s) and myself.  I personally evaluated the patient during the encounter      Vida Roller, MD 11/04/12 1404

## 2012-11-04 NOTE — ED Provider Notes (Signed)
History    CSN: [id number] Arrival date & time 11/04/12  1304  First MD Initiated Contact with Patient 11/04/12 1318     Chief Complaint  Patient presents with  . Cellulitis   (Consider location/radiation/quality/duration/timing/severity/associated sxs/prior Treatment) HPI Comments: Patient is a 22 year old female who presents with a 3 day history of right wrist pain. Symptoms started gradually and progressively worsened since the onset. The pain is aching and severe with radiation up her arm. Movement makes the pain worse. Patient reports associated joint swelling. Nothing makes the pain better. Patient tried Tramadol for pain. Patient denies any other symptoms or joint involvement. Patient denies fever, STD, or injury.   Past Medical History  Diagnosis Date  . Chlamydia 2012  . Acne   . Asthma     uses albuterol inhaler daily  . GERD (gastroesophageal reflux disease)     during pregnancy - takes zantac   Past Surgical History  Procedure Laterality Date  . Pregnancy termination    . No past surgeries     Family History  Problem Relation Age of Onset  . Anesthesia problems Neg Hx    History  Substance Use Topics  . Smoking status: Former Smoker -- 0.25 packs/day    Quit date: 03/03/2011  . Smokeless tobacco: Never Used  . Alcohol Use: No   OB History   Grav Para Term Preterm Abortions TAB SAB Ect Mult Living   2 1 1  0 1 1 0 0 0 1     Review of Systems  Musculoskeletal: Positive for joint swelling and arthralgias.  All other systems reviewed and are negative.    Allergies  Review of patient's allergies indicates no known allergies.  Home Medications   Current Outpatient Rx  Name  Route  Sig  Dispense  Refill  . albuterol (PROVENTIL HFA;VENTOLIN HFA) 108 (90 BASE) MCG/ACT inhaler   Inhalation   Inhale 2 puffs into the lungs every 6 (six) hours as needed. For shortness of breath.   8.5 g   1   . clindamycin (CLEOCIN) 150 MG capsule   Oral   Take 2  capsules (300 mg total) by mouth 3 (three) times daily. May dispense as 150mg  capsules   60 capsule   0   . traMADol (ULTRAM) 50 MG tablet   Oral   Take 1 tablet (50 mg total) by mouth every 6 (six) hours as needed for pain.   15 tablet   0    BP 115/84  Pulse 90  Temp(Src) 97.6 F (36.4 C) (Oral)  Resp 20  SpO2 98%  LMP 10/20/2012 Physical Exam  Nursing note and vitals reviewed. Constitutional: She appears well-developed and well-nourished. No distress.  HENT:  Head: Normocephalic and atraumatic.  Eyes: Conjunctivae and EOM are normal.  Neck: Normal range of motion.  Cardiovascular: Normal rate and regular rhythm.  Exam reveals no gallop and no friction rub.   No murmur heard. Pulmonary/Chest: Effort normal and breath sounds normal. She has no wheezes. She has no rales. She exhibits no tenderness.  Abdominal: Soft. There is no tenderness.  Musculoskeletal:  Right wrist tender to palpation with mild edema. ROM limited due to pain. Patient able to move fingers distal to wrist. No other joint affected.   Neurological: She is alert.  Speech is goal-oriented. Moves limbs without ataxia.   Skin: Skin is warm and dry.  Psychiatric: She has a normal mood and affect. Her behavior is normal.    ED Course  Procedures (including critical care time) Labs Reviewed - No data to display Dg Wrist Complete Right  11/03/2012   *RADIOLOGY REPORT*  Clinical Data: Wrist pain  RIGHT WRIST - COMPLETE 3+ VIEW  Comparison: None.  Findings: There is no evidence of fracture or dislocation.  There is no evidence of arthropathy or other focal bone abnormality. Soft tissues are unremarkable.  IMPRESSION: Negative exam.   Original Report Authenticated By: Signa Kell, M.D.   1. Gout     MDM  2:02 PM Patient will be treated for gout with instructions to follow up with PCP. Patient given strict return precautions. Vitals stable and patient afebrile.   Emilia Beck, PA-C 11/04/12 1419

## 2012-11-04 NOTE — ED Notes (Signed)
Diagnosed with cellulitis to R wrist at [facility] yesterday, started on oral clindamycin, states pain has not gotten any better. States "it feels like a bone is broken but they did an xray and it was normal." mild redness noted to skin on R lateral wrist. No injuries. cms intact

## 2012-11-05 NOTE — ED Provider Notes (Signed)
Medical screening examination/treatment/procedure(s) were conducted as a shared visit with non-physician practitioner(s) and myself.  I personally evaluated the patient during the encounter  Please see my separate respective documentation pertaining to this patient encounter   Vida Roller, MD 11/05/12 [phone]

## 2012-11-06 ENCOUNTER — Encounter (HOSPITAL_COMMUNITY): Payer: Self-pay | Admitting: *Deleted

## 2012-11-06 ENCOUNTER — Emergency Department (HOSPITAL_COMMUNITY)
Admission: EM | Admit: 2012-11-06 | Discharge: 2012-11-06 | Disposition: A | Discharge status: Home / Self Care | Payer: Medicaid Other | Attending: Emergency Medicine | Admitting: Emergency Medicine

## 2012-11-06 DIAGNOSIS — M25531 Pain in right wrist: Secondary | ICD-10-CM

## 2012-11-06 DIAGNOSIS — Z872 Personal history of diseases of the skin and subcutaneous tissue: Secondary | ICD-10-CM | POA: Insufficient documentation

## 2012-11-06 DIAGNOSIS — Z8619 Personal history of other infectious and parasitic diseases: Secondary | ICD-10-CM | POA: Insufficient documentation

## 2012-11-06 DIAGNOSIS — M25539 Pain in unspecified wrist: Secondary | ICD-10-CM | POA: Insufficient documentation

## 2012-11-06 DIAGNOSIS — Z79899 Other long term (current) drug therapy: Secondary | ICD-10-CM | POA: Insufficient documentation

## 2012-11-06 DIAGNOSIS — Z87891 Personal history of nicotine dependence: Secondary | ICD-10-CM | POA: Insufficient documentation

## 2012-11-06 DIAGNOSIS — Z8719 Personal history of other diseases of the digestive system: Secondary | ICD-10-CM | POA: Insufficient documentation

## 2012-11-06 DIAGNOSIS — J45909 Unspecified asthma, uncomplicated: Secondary | ICD-10-CM | POA: Insufficient documentation

## 2012-11-06 DIAGNOSIS — M25449 Effusion, unspecified hand: Secondary | ICD-10-CM | POA: Insufficient documentation

## 2012-11-06 LAB — SYNOVIAL FLUID, CRYSTAL: Crystals, Fluid: NONE SEEN

## 2012-11-06 LAB — GRAM STAIN

## 2012-11-06 MED ORDER — HYDROCODONE-ACETAMINOPHEN 5-325 MG PO TABS
1.0000 | ORAL_TABLET | Freq: Once | ORAL | Status: AC
  Administered 2012-11-06: 1 via ORAL
  Filled 2012-11-06: qty 1

## 2012-11-06 MED ORDER — HYDROCODONE-ACETAMINOPHEN 5-325 MG PO TABS: 1.0000 | ORAL_TABLET | Freq: Three times a day (TID) | ORAL | Status: DC | PRN

## 2012-11-06 NOTE — ED Provider Notes (Signed)
History  This chart was scribed for Glade Nurse, PA-C working with Enid Skeens, MD by Greggory Stallion, ED scribe. This patient was seen in room TR06C/TR06C and the patient's care was started at 4:42 PM.  CSN: [id number] Arrival date & time 11/06/12  1554  Chief Complaint  Patient presents with  . Hand Pain   Patient is a 22 y.o. female presenting with wrist pain. The history is provided by the patient. No language interpreter was used.  Wrist Pain This is a new problem. The current episode started in the past 7 days. The problem occurs constantly. The problem has been unchanged. Associated symptoms include arthralgias and joint swelling. Pertinent negatives include no chest pain, chills, diaphoresis, fever, headaches, nausea, neck pain, numbness, rash, urinary symptoms, vertigo, visual change, vomiting or weakness.    HPI Comments: Gail Walker is a 22 y.o. female who presents to the Emergency Department complaining of gradual onset, constant sharp right hand pain that started 5 days ago. Pt was seen in the ED on 11/03/12 and treated with cipro for cellulitis and told to follow up with her PCP. Pt returned on 11/04/12 without seeing her PCP and was treated with indomethacin for gout. Pt has been taking both as directed and has not sought medical intervention other than the ED. Pt rates her pain 10/10, constant, sharp, radiating up forearm at times. No change in pain or ROM.   Past Medical History  Diagnosis Date  . Chlamydia 2012  . Acne   . Asthma     uses albuterol inhaler daily  . GERD (gastroesophageal reflux disease)     during pregnancy - takes zantac   Past Surgical History  Procedure Laterality Date  . Pregnancy termination    . No past surgeries     Family History  Problem Relation Age of Onset  . Anesthesia problems Neg Hx    History  Substance Use Topics  . Smoking status: Former Smoker -- 0.25 packs/day    Quit date: 03/03/2011  . Smokeless tobacco: Never Used   . Alcohol Use: No   OB History   Grav Para Term Preterm Abortions TAB SAB Ect Mult Living   2 1 1  0 1 1 0 0 0 1     Review of Systems  Constitutional: Negative for fever, chills and diaphoresis.  HENT: Negative for neck pain and neck stiffness.   Eyes: Negative for visual disturbance.  Respiratory: Negative for apnea and chest tightness.   Cardiovascular: Negative for chest pain and palpitations.  Gastrointestinal: Negative for nausea, vomiting, diarrhea and constipation.  Genitourinary: Negative for dysuria.  Musculoskeletal: Positive for joint swelling and arthralgias. Negative for gait problem.  Skin: Negative for rash.  Neurological: Negative for dizziness, vertigo, weakness, light-headedness, numbness and headaches.    Allergies  Review of patient's allergies indicates no known allergies.  Home Medications   Current Outpatient Rx  Name  Route  Sig  Dispense  Refill  . albuterol (PROVENTIL HFA;VENTOLIN HFA) 108 (90 BASE) MCG/ACT inhaler   Inhalation   Inhale 2 puffs into the lungs every 6 (six) hours as needed for wheezing or shortness of breath.         . clindamycin (CLEOCIN) 150 MG capsule   Oral   Take 2 capsules (300 mg total) by mouth 3 (three) times daily. May dispense as 150mg  capsules   60 capsule   0   . HYDROcodone-acetaminophen (HYCET) 7.5-325 mg/15 ml solution   Oral   Take 15 mLs  by mouth 4 (four) times daily as needed for pain.         . indomethacin (INDOCIN) 25 MG capsule   Oral   Take 25 mg by mouth 3 (three) times daily as needed (pain).         . traMADol (ULTRAM) 50 MG tablet   Oral   Take 1 tablet (50 mg total) by mouth every 6 (six) hours as needed for pain.   15 tablet   0   . triamcinolone ointment (KENALOG) 0.1 %   Topical   Apply 1 application topically 2 (two) times daily as needed (applies wherever skin irritation appears).          BP 143/73  Pulse 88  Temp(Src) 97.6 F (36.4 C) (Oral)  Resp 18  SpO2 96%  LMP  10/20/2012  Physical Exam  Nursing note and vitals reviewed. Constitutional: She is oriented to person, place, and time. She appears well-developed and well-nourished. No distress.  HENT:  Head: Normocephalic and atraumatic.  Eyes: Conjunctivae and EOM are normal.  Neck: Normal range of motion. Neck supple.  No meningeal signs  Cardiovascular: Normal rate, regular rhythm, normal heart sounds and intact distal pulses.  Exam reveals no gallop and no friction rub.   No murmur heard. Pulmonary/Chest: Effort normal and breath sounds normal. No respiratory distress. She has no wheezes. She has no rales. She exhibits no tenderness.  Abdominal: Soft. Bowel sounds are normal. She exhibits no distension. There is no tenderness. There is no rebound and no guarding.  Musculoskeletal: Normal range of motion. She exhibits no edema and no tenderness.  FROM to upper and lower extremities, including right wrist and all digits on left hand.   Neurological: She is alert and oriented to person, place, and time. No cranial nerve deficit.  Speech is clear and goal oriented, follows commands Sensation normal to light touch and two point discrimination Moves extremities without ataxia, coordination intact Normal gait and balance Normal strength in upper and lower extremities bilaterally including dorsiflexion and plantar flexion, strong and equal grip strength   Skin: Skin is warm and dry. She is not diaphoretic. No erythema.  Psychiatric: She has a normal mood and affect.    ED Course  Procedures (including critical care time)  DIAGNOSTIC STUDIES: Oxygen Saturation is 96% on RA, normal by my interpretation.    COORDINATION OF CARE: 5:16 PM-Discussed treatment plan which includes Dr. Jodi Mourning coming to speak with her with pt at bedside and pt agreed to plan.   6:00 PM- Time out called for joint aspiration of right posterior wrist done by Dr. Blane Ohara, MD. 4 mL of 2% lidocaine with epi used. Minimal  serosanguineous fluid aspirated. After aspiration, advised pt to follow up with orthopaedics in the next 2-3 days.   Labs Reviewed  GRAM STAIN  SYNOVIAL FLUID, CRYSTAL  No results found. Dg Wrist Complete Right  11/03/2012   *RADIOLOGY REPORT*  Clinical Data: Wrist pain  RIGHT WRIST - COMPLETE 3+ VIEW  Comparison: None.  Findings: There is no evidence of fracture or dislocation.  There is no evidence of arthropathy or other focal bone abnormality. Soft tissues are unremarkable.  IMPRESSION: Negative exam.   Original Report Authenticated By: Signa Kell, M.D.  No results found. 1. Right wrist pain     MDM  Pt presents with monoarticular pain, swelling, warmth, no erythema.  Pt is afebrile and stable. Neurovascularly intact and two-point discrimination intact.  Previous imaging reviewed, no evidence of occult  fracture or injury. Pt on cipro for poss cellulitis and indomethacin for poss gout. Pt has taken 4 doses of indomethacin with no relief. Pt case discussed with and seen by Dr. Jodi Mourning who did a joint aspiration, drawing a very small amount of serosanguinous fluid. Sent to lab for synovial fluid evaluation and gram stain. Not enough for a culture. Results show no crystals, but significant WBC, predominately PMN. Pt directed to take medicines as directed and to follow up with orthopedics, and possibly rheumatoid doctor.  At this time there does not appear to be any evidence of an acute emergency medical condition and the patient appears stable for discharge with appropriate outpatient follow up.Diagnosis was discussed with patient who verbalizes understanding and is agreeable to discharge.   I personally performed the services described in this documentation, which was scribed in my presence. The recorded information has been reviewed and is accurate.    Glade Nurse, PA-C 11/07/12 [phone]

## 2012-11-06 NOTE — ED Notes (Signed)
Pt was seen here on Sunday with gout to right arm and was told to come back on Tuesday.  No change in pain.

## 2012-11-08 ENCOUNTER — Ambulatory Visit: Payer: Medicaid Other | Admitting: Family Medicine

## 2012-11-08 NOTE — ED Provider Notes (Signed)
Medical screening examination/treatment/procedure(s) were conducted as a shared visit with non-physician practitioner(s) or resident  and myself.  I personally evaluated the patient during the encounter and agree with the findings and plan unless otherwise indicated.  Isolated wrist pain/ swelling.  Clinically diagnosed with gout previously, no tap or blood work done.  No other joint involvement.  Discussed r/ b of joint aspiration attempt, pt understands and agrees with plan. Procedure Right wrist joint aspiration.  Sterile Procedure.  Lidocaine 1% 8 cc.  Few ccs of serosanginious obtained.  Dorsal wrist entry.   Enid Skeens, MD 11/08/12 [phone]

## 2012-12-02 ENCOUNTER — Emergency Department (HOSPITAL_COMMUNITY)
Admission: EM | Admit: 2012-12-02 | Discharge: 2012-12-02 | Disposition: A | Discharge status: Home / Self Care | Payer: Self-pay | Attending: Emergency Medicine | Admitting: Emergency Medicine

## 2012-12-02 ENCOUNTER — Encounter (HOSPITAL_COMMUNITY): Payer: Self-pay | Admitting: *Deleted

## 2012-12-02 DIAGNOSIS — Z8719 Personal history of other diseases of the digestive system: Secondary | ICD-10-CM | POA: Insufficient documentation

## 2012-12-02 DIAGNOSIS — J45901 Unspecified asthma with (acute) exacerbation: Secondary | ICD-10-CM | POA: Insufficient documentation

## 2012-12-02 DIAGNOSIS — Z79899 Other long term (current) drug therapy: Secondary | ICD-10-CM | POA: Insufficient documentation

## 2012-12-02 DIAGNOSIS — R059 Cough, unspecified: Secondary | ICD-10-CM | POA: Insufficient documentation

## 2012-12-02 DIAGNOSIS — Z8619 Personal history of other infectious and parasitic diseases: Secondary | ICD-10-CM | POA: Insufficient documentation

## 2012-12-02 DIAGNOSIS — Z87891 Personal history of nicotine dependence: Secondary | ICD-10-CM | POA: Insufficient documentation

## 2012-12-02 DIAGNOSIS — R05 Cough: Secondary | ICD-10-CM | POA: Insufficient documentation

## 2012-12-02 DIAGNOSIS — Z872 Personal history of diseases of the skin and subcutaneous tissue: Secondary | ICD-10-CM | POA: Insufficient documentation

## 2012-12-02 MED ORDER — PREDNISONE 20 MG PO TABS: 40.0000 mg | ORAL_TABLET | Freq: Every day | ORAL | Status: DC

## 2012-12-02 MED ORDER — IPRATROPIUM BROMIDE 0.02 % IN SOLN
0.5000 mg | Freq: Once | RESPIRATORY_TRACT | Status: AC
  Administered 2012-12-02: 0.5 mg via RESPIRATORY_TRACT
  Filled 2012-12-02: qty 2.5

## 2012-12-02 MED ORDER — ALBUTEROL SULFATE HFA 108 (90 BASE) MCG/ACT IN AERS
2.0000 | INHALATION_SPRAY | Freq: Once | RESPIRATORY_TRACT | Status: AC
  Administered 2012-12-02: 2 via RESPIRATORY_TRACT
  Filled 2012-12-02: qty 6.7

## 2012-12-02 MED ORDER — ALBUTEROL SULFATE (5 MG/ML) 0.5% IN NEBU
5.0000 mg | INHALATION_SOLUTION | Freq: Once | RESPIRATORY_TRACT | Status: AC
  Administered 2012-12-02: 5 mg via RESPIRATORY_TRACT
  Filled 2012-12-02 (×2): qty 0.5

## 2012-12-02 MED ORDER — IPRATROPIUM BROMIDE 0.02 % IN SOLN
0.5000 mg | Freq: Once | RESPIRATORY_TRACT | Status: AC
  Administered 2012-12-02: 0.5 mg via RESPIRATORY_TRACT
  Filled 2012-12-02 (×2): qty 2.5

## 2012-12-02 MED ORDER — ALBUTEROL SULFATE (5 MG/ML) 0.5% IN NEBU
5.0000 mg | INHALATION_SOLUTION | Freq: Once | RESPIRATORY_TRACT | Status: AC
  Administered 2012-12-02: 5 mg via RESPIRATORY_TRACT
  Filled 2012-12-02 (×2): qty 1

## 2012-12-02 MED ORDER — PREDNISONE 20 MG PO TABS
60.0000 mg | ORAL_TABLET | Freq: Once | ORAL | Status: AC
  Administered 2012-12-02: 60 mg via ORAL
  Filled 2012-12-02: qty 3

## 2012-12-02 NOTE — ED Provider Notes (Signed)
CSN: [id number]     Arrival date & time 12/02/12  9147 History     First MD Initiated Contact with Patient 12/02/12 1031     Chief Complaint  Patient presents with  . Asthma   (Consider location/radiation/quality/duration/timing/severity/associated sxs/prior Treatment) HPI Comments: Patient presents with shortness of breath and wheezing for the past one week. Patient has a history of hospitalizations for asthma. She is on home albuterol inhaler of which she is currently out. She denies upper respiratory tract infection symptoms or fever. She complains of cough with wheezing. Onset of symptoms gradual. Course is constant. Nothing makes symptoms better or worse.  The history is provided by the patient.    Past Medical History  Diagnosis Date  . Chlamydia 2012  . Acne   . Asthma     uses albuterol inhaler daily  . GERD (gastroesophageal reflux disease)     during pregnancy - takes zantac   Past Surgical History  Procedure Laterality Date  . Pregnancy termination    . No past surgeries     Family History  Problem Relation Age of Onset  . Anesthesia problems Neg Hx    History  Substance Use Topics  . Smoking status: Former Smoker -- 0.25 packs/day    Quit date: 03/03/2011  . Smokeless tobacco: Never Used  . Alcohol Use: No   OB History   Grav Para Term Preterm Abortions TAB SAB Ect Mult Living   2 1 1  0 1 1 0 0 0 1     Review of Systems  Constitutional: Negative for fever.  HENT: Negative for sore throat and rhinorrhea.   Eyes: Negative for redness.  Respiratory: Positive for shortness of breath and wheezing. Negative for cough.   Cardiovascular: Negative for chest pain.  Gastrointestinal: Negative for nausea, vomiting, abdominal pain and diarrhea.  Genitourinary: Negative for dysuria.  Musculoskeletal: Negative for myalgias.  Skin: Negative for rash.  Neurological: Negative for headaches.    Allergies  Review of patient's allergies indicates no known  allergies.  Home Medications   Current Outpatient Rx  Name  Route  Sig  Dispense  Refill  . albuterol (PROVENTIL HFA;VENTOLIN HFA) 108 (90 BASE) MCG/ACT inhaler   Inhalation   Inhale 2 puffs into the lungs every 6 (six) hours as needed for wheezing or shortness of breath.         . clindamycin (CLEOCIN) 150 MG capsule   Oral   Take 2 capsules (300 mg total) by mouth 3 (three) times daily. May dispense as 150mg  capsules   60 capsule   0   . HYDROcodone-acetaminophen (HYCET) 7.5-325 mg/15 ml solution   Oral   Take 15 mLs by mouth 4 (four) times daily as needed for pain.         Marland Kitchen HYDROcodone-acetaminophen (NORCO/VICODIN) 5-325 MG per tablet   Oral   Take 1 tablet by mouth every 8 (eight) hours as needed for pain.   6 tablet   0   . indomethacin (INDOCIN) 25 MG capsule   Oral   Take 25 mg by mouth 3 (three) times daily as needed (pain).         . traMADol (ULTRAM) 50 MG tablet   Oral   Take 1 tablet (50 mg total) by mouth every 6 (six) hours as needed for pain.   15 tablet   0   . triamcinolone ointment (KENALOG) 0.1 %   Topical   Apply 1 application topically 2 (two) times daily as needed (  applies wherever skin irritation appears).          BP 129/92  Pulse 91  Temp(Src) 99 F (37.2 C) (Oral)  Resp 14  Wt 165 lb (74.844 kg)  BMI 28.31 kg/m2  SpO2 96%  LMP 10/20/2012 Physical Exam  Nursing note and vitals reviewed. Constitutional: She appears well-developed and well-nourished.  HENT:  Head: Normocephalic and atraumatic.  Eyes: Conjunctivae are normal.  Neck: Normal range of motion. Neck supple.  Cardiovascular: Regular rhythm.   No murmur heard. Pulmonary/Chest: No respiratory distress. She has wheezes (moderate, expiratory, all fields). She has no rales.  Neurological: She is alert.  Skin: Skin is warm and dry.  Psychiatric: She has a normal mood and affect.    ED Course   Procedures (including critical care time)  Labs Reviewed - No data to  display No results found. 1. Asthma exacerbation     10:35 AM Patient seen and examined. Work-up initiated. Medications ordered.   Vital signs reviewed and are as follows: Filed Vitals:   12/02/12 0959  BP: 129/92  Pulse: 91  Temp: 99 F (37.2 C)  Resp: 14   11:11 AM first treatment complete. There is slight improvement however patient still has moderate wheezing. Will give additional treatment. Patient agrees. She states that he she is feeling a little better.  12:00 PM patient now with mild expiratory wheezing. She states she is feeling much better. I've given her option of hour long breathing treatment. She states that she wants to be discharged and that she feels well enough to be discharged.  I have encouraged patient to followup with her primary care physician, use albuterol inhaler every 4 hours, and to return to the emergency department if she has worsening breathing or worsening shortness of breath. Patient verbalizes understanding and agrees with this plan.   MDM  Patient with asthma exacerbation. She is subjectively and objectively improved in emergency department. She does have some wheezing which is now mild at discharge however she wants to leave. Her O2 sat is normal. She appears well and in no respiratory distress. She is discharged home with albuterol inhaler, steroids, PCP followup.  Renne Crigler, PA-C 12/02/12 1202

## 2012-12-02 NOTE — ED Provider Notes (Signed)
Medical screening examination/treatment/procedure(s) were performed by non-physician practitioner and as supervising physician I was immediately available for consultation/collaboration.  Brydon Spahr N Talesha Ellithorpe, DO 12/02/12 1654 

## 2012-12-02 NOTE — ED Notes (Signed)
Reports having problems with asthma and sob x 1 week, is out of inhaler. Airway intact at triage, speaking in full sentences, spo2 96%

## 2012-12-12 ENCOUNTER — Ambulatory Visit (INDEPENDENT_AMBULATORY_CARE_PROVIDER_SITE_OTHER): Payer: Self-pay | Admitting: *Deleted

## 2012-12-12 DIAGNOSIS — Z111 Encounter for screening for respiratory tuberculosis: Secondary | ICD-10-CM

## 2012-12-12 NOTE — Progress Notes (Signed)
PPD Placement note Gail Walker, 22 y.o. female is here today for placement of PPD test Reason for PPD test: work  Pt taken PPD test before: yes Verified in allergy area and with patient that they are not allergic to the products PPD is made of (Phenol or Tween). Yes Is patient taking any oral or IV steroid medication now or have they taken it in the last month? no Has the patient ever received the BCG vaccine?: no Has the patient been in recent contact with anyone known or suspected of having active TB disease?: no    O: Alert and oriented in NAD. P:  PPD placed on 12/12/2012.  Patient advised to return for reading within 48-72 hours. Wyatt Haste, RN-BSN

## 2012-12-14 ENCOUNTER — Ambulatory Visit (INDEPENDENT_AMBULATORY_CARE_PROVIDER_SITE_OTHER): Payer: No Typology Code available for payment source | Admitting: *Deleted

## 2012-12-14 ENCOUNTER — Encounter: Payer: Self-pay | Admitting: *Deleted

## 2012-12-14 DIAGNOSIS — Z09 Encounter for follow-up examination after completed treatment for conditions other than malignant neoplasm: Secondary | ICD-10-CM

## 2012-12-14 DIAGNOSIS — Z111 Encounter for screening for respiratory tuberculosis: Secondary | ICD-10-CM

## 2012-12-23 NOTE — Progress Notes (Signed)
PPD Reading Note PPD read and results entered in [employer]. Result: 0 mm induration. Interpretation: Negative Starasia Sinko Ann, RN  

## 2012-12-28 ENCOUNTER — Telehealth: Payer: Self-pay | Admitting: Family Medicine

## 2012-12-28 NOTE — Telephone Encounter (Signed)
Pt went to go give blood but couldn't because she started wheezing and needs form filled out.

## 2012-12-31 NOTE — Telephone Encounter (Signed)
Form placed in Dr Gwendolyn Grant box for completion and & signature. Pedro Oldenburg, Virgel Bouquet

## 2013-01-16 ENCOUNTER — Ambulatory Visit: Payer: Self-pay | Admitting: Family Medicine

## 2013-02-05 ENCOUNTER — Ambulatory Visit: Payer: Medicaid Other | Admitting: Family Medicine

## 2013-02-07 NOTE — Telephone Encounter (Signed)
Form was placed at front desk and picked up by patient on 01/02/13.  Gaylene Brooks, RN

## 2013-02-19 ENCOUNTER — Ambulatory Visit: Payer: Self-pay | Admitting: Family Medicine

## 2013-04-03 ENCOUNTER — Inpatient Hospital Stay (HOSPITAL_COMMUNITY)
Admission: AD | Admit: 2013-04-03 | Discharge: 2013-04-03 | Disposition: A | Discharge status: Home / Self Care | Payer: Medicaid Other | Source: Ambulatory Visit | Attending: Obstetrics & Gynecology | Admitting: Obstetrics & Gynecology

## 2013-04-03 DIAGNOSIS — Z3202 Encounter for pregnancy test, result negative: Secondary | ICD-10-CM | POA: Insufficient documentation

## 2013-04-03 DIAGNOSIS — N912 Amenorrhea, unspecified: Secondary | ICD-10-CM | POA: Insufficient documentation

## 2013-04-03 NOTE — MAU Note (Signed)
Patient states her period is late and wants to have a pregnancy test.

## 2013-06-23 ENCOUNTER — Other Ambulatory Visit: Payer: Self-pay | Admitting: Family Medicine

## 2013-09-08 ENCOUNTER — Other Ambulatory Visit: Payer: Self-pay | Admitting: Family Medicine

## 2013-09-30 ENCOUNTER — Inpatient Hospital Stay (HOSPITAL_COMMUNITY)
Admission: AD | Admit: 2013-09-30 | Discharge: 2013-09-30 | Disposition: A | Discharge status: Home / Self Care | Payer: Medicaid Other | Source: Ambulatory Visit | Attending: Obstetrics & Gynecology | Admitting: Obstetrics & Gynecology

## 2013-09-30 ENCOUNTER — Encounter (HOSPITAL_COMMUNITY): Payer: Self-pay | Admitting: *Deleted

## 2013-09-30 DIAGNOSIS — K219 Gastro-esophageal reflux disease without esophagitis: Secondary | ICD-10-CM | POA: Insufficient documentation

## 2013-09-30 DIAGNOSIS — Z3202 Encounter for pregnancy test, result negative: Secondary | ICD-10-CM | POA: Insufficient documentation

## 2013-09-30 DIAGNOSIS — R112 Nausea with vomiting, unspecified: Secondary | ICD-10-CM

## 2013-09-30 DIAGNOSIS — Z87891 Personal history of nicotine dependence: Secondary | ICD-10-CM | POA: Insufficient documentation

## 2013-09-30 LAB — POCT PREGNANCY, URINE: Preg Test, Ur: NEGATIVE

## 2013-09-30 LAB — URINALYSIS, ROUTINE W REFLEX MICROSCOPIC
BILIRUBIN URINE: NEGATIVE
Glucose, UA: NEGATIVE mg/dL
Hgb urine dipstick: NEGATIVE
KETONES UR: NEGATIVE mg/dL
NITRITE: NEGATIVE
PROTEIN: NEGATIVE mg/dL
SPECIFIC GRAVITY, URINE: 1.02 (ref 1.005–1.030)
UROBILINOGEN UA: 1 mg/dL (ref 0.0–1.0)
pH: 8.5 — ABNORMAL HIGH (ref 5.0–8.0)

## 2013-09-30 LAB — URINE MICROSCOPIC-ADD ON

## 2013-09-30 NOTE — MAU Provider Note (Signed)
Attestation of Attending Supervision of Advanced Practitioner (CNM/NP): Evaluation and management procedures were performed by the Advanced Practitioner under my supervision and collaboration.  I have reviewed the Advanced Practitioner's note and chart, and I agree with the management and plan.  HARRAWAY-SMITH, Odarius Dines 1:41 PM

## 2013-09-30 NOTE — Discharge Instructions (Signed)

## 2013-09-30 NOTE — MAU Note (Signed)
Just coming to see if she is preg.  Has not done a home test. Missed period, has been throwing up.

## 2013-09-30 NOTE — MAU Provider Note (Signed)
History     CSN: [id number]  Arrival date and time: 09/30/13 1105   None     Chief Complaint  Patient presents with  . Possible Pregnancy   HPI [name] is a 23 y.o. G2P1011 who presents to MAU today with complaint of possible pregnancy. The patient states that she has had one episode of N/V each morning over the last 2 mornings. She has not taken a pregnancy test. She states LMP of 08/20/13. She has a history of irregular periods. She is not on birth control. She uses condoms most of the time. She is sexually active. She denies fever, abdominal pain or diarrhea.   OB History   Grav Para Term Preterm Abortions TAB SAB Ect Mult Living   2 1 1  0 1 1 0 0 0 1      Past Medical History  Diagnosis Date  . Chlamydia 2012  . Acne   . Asthma     uses albuterol inhaler daily  . GERD (gastroesophageal reflux disease)     during pregnancy - takes zantac    Past Surgical History  Procedure Laterality Date  . Pregnancy termination    . No past surgeries      Family History  Problem Relation Age of Onset  . Anesthesia problems Neg Hx   . Hearing loss Neg Hx   . Cancer Maternal Aunt     History  Substance Use Topics  . Smoking status: Former Smoker -- 0.25 packs/day    Quit date: 03/03/2011  . Smokeless tobacco: Never Used  . Alcohol Use: No    Allergies: No Known Allergies  Prescriptions prior to admission  Medication Sig Dispense Refill  . albuterol (PROVENTIL HFA;VENTOLIN HFA) 108 (90 BASE) MCG/ACT inhaler Inhale 2 puffs into the lungs every 6 (six) hours as needed for wheezing or shortness of breath.        Review of Systems  Constitutional: Negative for fever.  Gastrointestinal: Positive for nausea and vomiting. Negative for abdominal pain, diarrhea and constipation.  Genitourinary:       Neg - vaginal bleeding   Physical Exam   Blood pressure 125/74, pulse 104, temperature 98.6 F (37 C), temperature source Oral, resp. rate 18, height 5\' 2"  (1.575  m), weight 205 lb (92.987 kg), last menstrual period 08/20/2013.  Physical Exam  Constitutional: She is oriented to person, place, and time. She appears well-developed and well-nourished. No distress.  HENT:  Head: Normocephalic and atraumatic.  Cardiovascular: Normal rate.   Respiratory: Effort normal.  GI: Soft. She exhibits no distension and no mass. There is no tenderness. There is no rebound and no guarding.  Neurological: She is alert and oriented to person, place, and time.  Skin: Skin is warm and dry. No erythema.  Psychiatric: She has a normal mood and affect.   Results for orders placed during the hospital encounter of 09/30/13 (from the past 24 hour(s))  URINALYSIS, ROUTINE W REFLEX MICROSCOPIC     Status: Abnormal   Collection Time    09/30/13 11:20 AM      Result Value Ref Range   Color, Urine YELLOW  YELLOW   APPearance CLOUDY (*) CLEAR   Specific Gravity, Urine 1.020  1.005 - 1.030   pH 8.5 (*) 5.0 - 8.0   Glucose, UA NEGATIVE  NEGATIVE mg/dL   Hgb urine dipstick NEGATIVE  NEGATIVE   Bilirubin Urine NEGATIVE  NEGATIVE   Ketones, ur NEGATIVE  NEGATIVE mg/dL   Protein, ur NEGATIVE  NEGATIVE mg/dL   Urobilinogen, UA 1.0  0.0 - 1.0 mg/dL   Nitrite NEGATIVE  NEGATIVE   Leukocytes, UA SMALL (*) NEGATIVE  URINE MICROSCOPIC-ADD ON     Status: Abnormal   Collection Time    09/30/13 11:20 AM      Result Value Ref Range   Squamous Epithelial / LPF FEW (*) RARE   WBC, UA 3-6  <3 WBC/hpf   RBC / HPF 0-2  <3 RBC/hpf   Urine-Other MUCOUS PRESENT    POCT PREGNANCY, URINE     Status: None   Collection Time    09/30/13 11:33 AM      Result Value Ref Range   Preg Test, Ur NEGATIVE  NEGATIVE    MAU Course  Procedures None  MDM UPT - negative UA shows no signs of dehydration  Assessment and Plan  A: Nausea and vomiting Negative pregnancy test  P: Discharge home Patient advised to change morning diet in attempt to relieve N/V Patient advised to take HPT if she  does not have a period in 1-2 weeks Patient advised to follow-up with PCP if symptoms persist Patient may return to MAU as needed or if her condition were to change or worsen  Freddi StarrJulie N Ethier, PA-C  09/30/2013, 12:22 PM

## 2013-10-05 ENCOUNTER — Other Ambulatory Visit: Payer: Self-pay

## 2013-10-05 ENCOUNTER — Emergency Department (HOSPITAL_COMMUNITY)
Admission: EM | Admit: 2013-10-05 | Discharge: 2013-10-06 | Disposition: A | Discharge status: Home / Self Care | Payer: Medicaid Other | Attending: Emergency Medicine | Admitting: Emergency Medicine

## 2013-10-05 ENCOUNTER — Encounter (HOSPITAL_COMMUNITY): Payer: Self-pay | Admitting: Emergency Medicine

## 2013-10-05 ENCOUNTER — Other Ambulatory Visit: Payer: Self-pay | Admitting: Family Medicine

## 2013-10-05 ENCOUNTER — Emergency Department (HOSPITAL_COMMUNITY): Payer: Medicaid Other

## 2013-10-05 DIAGNOSIS — K219 Gastro-esophageal reflux disease without esophagitis: Secondary | ICD-10-CM | POA: Insufficient documentation

## 2013-10-05 DIAGNOSIS — Z79899 Other long term (current) drug therapy: Secondary | ICD-10-CM | POA: Diagnosis not present

## 2013-10-05 DIAGNOSIS — J45901 Unspecified asthma with (acute) exacerbation: Secondary | ICD-10-CM

## 2013-10-05 DIAGNOSIS — Z8619 Personal history of other infectious and parasitic diseases: Secondary | ICD-10-CM | POA: Insufficient documentation

## 2013-10-05 DIAGNOSIS — R0602 Shortness of breath: Secondary | ICD-10-CM | POA: Diagnosis present

## 2013-10-05 DIAGNOSIS — L708 Other acne: Secondary | ICD-10-CM | POA: Diagnosis not present

## 2013-10-05 DIAGNOSIS — Z87891 Personal history of nicotine dependence: Secondary | ICD-10-CM | POA: Diagnosis not present

## 2013-10-05 MED ORDER — ALBUTEROL SULFATE (2.5 MG/3ML) 0.083% IN NEBU
5.0000 mg | INHALATION_SOLUTION | Freq: Once | RESPIRATORY_TRACT | Status: AC
  Administered 2013-10-05: 5 mg via RESPIRATORY_TRACT
  Filled 2013-10-05: qty 6

## 2013-10-05 MED ORDER — IPRATROPIUM-ALBUTEROL 0.5-2.5 (3) MG/3ML IN SOLN
3.0000 mL | Freq: Once | RESPIRATORY_TRACT | Status: AC
  Administered 2013-10-05: 3 mL via RESPIRATORY_TRACT
  Filled 2013-10-05: qty 3

## 2013-10-05 MED ORDER — ALBUTEROL SULFATE (2.5 MG/3ML) 0.083% IN NEBU
2.5000 mg | INHALATION_SOLUTION | Freq: Once | RESPIRATORY_TRACT | Status: AC
  Administered 2013-10-05: 2.5 mg via RESPIRATORY_TRACT
  Filled 2013-10-05: qty 3

## 2013-10-05 MED ORDER — ALBUTEROL SULFATE (2.5 MG/3ML) 0.083% IN NEBU
2.5000 mg | INHALATION_SOLUTION | Freq: Once | RESPIRATORY_TRACT | Status: AC
  Administered 2013-10-05: 2.5 mg via RESPIRATORY_TRACT

## 2013-10-05 MED ORDER — IPRATROPIUM BROMIDE 0.02 % IN SOLN
0.5000 mg | Freq: Once | RESPIRATORY_TRACT | Status: AC
  Administered 2013-10-05: 0.5 mg via RESPIRATORY_TRACT
  Filled 2013-10-05: qty 2.5

## 2013-10-05 NOTE — ED Notes (Signed)
Bed: WA02 Expected date:  Expected time:  Means of arrival:  Comments: EMS 23yo F, asthma, shob

## 2013-10-05 NOTE — ED Provider Notes (Signed)
CSN: [id number]     Arrival date & time 10/05/13  2149 History   First MD Initiated Contact with Patient 10/05/13 2150     Chief Complaint  Patient presents with  . Shortness of Breath   Patient is a 23 y.o. female presenting with shortness of breath. The history is provided by the patient. No language interpreter was used.  Shortness of Breath Severity:  Moderate Onset quality:  Sudden Duration:  1 day Timing:  Constant Progression:  Unchanged Context: known allergens, pollens, smoke exposure and weather changes   Context comment:  Hot weather Relieved by:  Nothing Worsened by:  Activity and deep breathing Ineffective treatments:  Inhaler and rest Associated symptoms: cough   Associated symptoms: no abdominal pain, no chest pain, no claudication, no diaphoresis, no ear pain, no fever, no headaches, no hemoptysis, no neck pain, no PND, no rash, no sore throat, no sputum production, no syncope, no swollen glands, no vomiting and no wheezing   Risk factors: obesity   Risk factors: no oral contraceptive use, no prolonged immobilization, no recent surgery and no tobacco use     Past Medical History  Diagnosis Date  . Chlamydia 2012  . Acne   . Asthma     uses albuterol inhaler daily  . GERD (gastroesophageal reflux disease)     during pregnancy - takes zantac   Past Surgical History  Procedure Laterality Date  . Pregnancy termination    . No past surgeries     Family History  Problem Relation Age of Onset  . Anesthesia problems Neg Hx   . Hearing loss Neg Hx   . Cancer Maternal Aunt    History  Substance Use Topics  . Smoking status: Former Smoker -- 0.25 packs/day    Quit date: 03/03/2011  . Smokeless tobacco: Never Used  . Alcohol Use: No   OB History   Grav Para Term Preterm Abortions TAB SAB Ect Mult Living   2 1 1  0 1 1 0 0 0 1     Review of Systems  Constitutional: Negative for fever and diaphoresis.  HENT: Negative for ear pain and sore throat.    Respiratory: Positive for cough and shortness of breath. Negative for hemoptysis, sputum production and wheezing.   Cardiovascular: Negative for chest pain, claudication, syncope and PND.  Gastrointestinal: Negative for vomiting and abdominal pain.  Musculoskeletal: Negative for neck pain.  Skin: Negative for rash.  Neurological: Negative for headaches.      Allergies  Review of patient's allergies indicates no known allergies.  Home Medications   Prior to Admission medications   Medication Sig Start Date End Date Taking? Authorizing Provider  albuterol (PROVENTIL HFA;VENTOLIN HFA) 108 (90 BASE) MCG/ACT inhaler Inhale 2 puffs into the lungs every 6 (six) hours as needed for wheezing or shortness of breath. 09/03/12  Yes Tobey GrimJeffrey H Walden, MD  predniSONE (DELTASONE) 10 MG tablet Take 4 tablets (40 mg total) by mouth daily. 10/06/13 10/11/13  Courtney A Forucci, PA-C   BP 134/74  Pulse 97  Temp(Src) 97.7 F (36.5 C) (Oral)  Resp 18  SpO2 94%  LMP 10/01/2013 Physical Exam  Nursing note and vitals reviewed. Constitutional: She is oriented to person, place, and time. She appears well-developed and well-nourished. No distress.  HENT:  Head: Normocephalic and atraumatic.  Mouth/Throat: Oropharynx is clear and moist. No oropharyngeal exudate.  Eyes: Conjunctivae are normal. No scleral icterus.  Neck: Normal range of motion. Neck supple. No thyromegaly present.  Cardiovascular:  Normal rate, regular rhythm, normal heart sounds and intact distal pulses.  Exam reveals no gallop and no friction rub.   No murmur heard. Pulmonary/Chest: Effort normal. No respiratory distress. She has decreased breath sounds in the right lower field and the left lower field. She has wheezes in the right upper field, the right middle field, the right lower field, the left upper field, the left middle field and the left lower field. She has no rales. She exhibits no tenderness.  Abdominal: Soft. Bowel sounds are  normal. She exhibits no distension and no mass. There is no tenderness. There is no rebound and no guarding.  Musculoskeletal: Normal range of motion.  Lymphadenopathy:    She has no cervical adenopathy.  Neurological: She is alert and oriented to person, place, and time. No cranial nerve deficit.  Skin: Skin is warm and dry. She is not diaphoretic.  Psychiatric: She has a normal mood and affect. Her behavior is normal. Judgment and thought content normal.    ED Course  Procedures (including critical care time) Labs Review Labs Reviewed - No data to display  Imaging Review Dg Chest 2 View  10/05/2013   CLINICAL DATA:  Short of breath, wheezing  EXAM: CHEST  2 VIEW  COMPARISON:  Radiograph 08/28/2011  FINDINGS: Normal mediastinum and cardiac silhouette. Normal pulmonary vasculature. No evidence of effusion, infiltrate, or pneumothorax. No acute bony abnormality.  IMPRESSION: Normal chest radiograph.   Electronically Signed   By: Genevive BiStewart  Edmunds M.D.   On: 10/05/2013 22:16     EKG Interpretation   Date/Time:  Saturday October 05 2013 21:56:32 EDT Ventricular Rate:  101 PR Interval:  129 QRS Duration: 78 QT Interval:  345 QTC Calculation: 447 R Axis:   87 Text Interpretation:  Sinus tachycardia No previous tracing Confirmed by  Denton LankSTEINL  MD, Caryn BeeKEVIN ([id number]) on 10/05/2013 10:41:11 PM      MDM   Final diagnoses:  Asthma exacerbation   Patient presents with one day of shortness of breath.  On presentation the patient had end expiratory wheezing in all lung fields.  She had one nebulizer treatment and solumedrol by EMS.  She received another 2 nebulizer treatment here in the ED.  She reports good relief of her symptoms.  Patient ambulated well in the ED with average pulse ox of 96.  Patient to be discharged home at this time. CXR and EKG were unremarkable.  Will prescribe 40 mg of prednisone x 5 days and will have the patient follow-up with her PCP at that time.  Patient was given return  precautions of worsening shortness of breath or chest pain.        Clydie Braunourtney A Forucci, PA-C 10/06/13 0011

## 2013-10-05 NOTE — ED Notes (Signed)
Patient ha a hx of asthma. The patient received albuterol and Atrovent with EMS - also started a line and 125mg  of Solumedrol

## 2013-10-06 MED ORDER — PREDNISONE 10 MG PO TABS: 40.0000 mg | ORAL_TABLET | Freq: Every day | ORAL | Status: AC

## 2013-10-06 NOTE — Discharge Instructions (Signed)
Asthma, Adult  Asthma is a condition of the lungs in which the airways tighten and narrow. Asthma can make it hard to breathe. Asthma cannot be cured, but medicine and lifestyle changes can help control it. Asthma may be started (triggered) by:  · Animal skin flakes (dander).  · Dust.  · Cockroaches.  · Pollen.  · Mold.  · Smoke.  · Cleaning products.  · Hair sprays or aerosol sprays.  · Paint fumes or strong smells.  · Cold air, weather changes, and winds.  · Crying or laughing hard.  · Stress.  · Certain medicines or drugs.  · Foods, such as dried fruit, potato chips, and sparkling grape juice.  · Infections or conditions (colds, flu).  · Exercise.  · Certain medical conditions or diseases.  · Exercise or tiring activities.  HOME CARE   · Take medicine as told by your doctor.  · Use a peak flow meter as told by your doctor. A peak flow meter is a tool that measures how well the lungs are working.  · Record and keep track of the peak flow meter's readings.  · Understand and use the asthma action plan. An asthma action plan is a written plan for taking care of your asthma and treating your attacks.  · To help prevent asthma attacks:  ¨ Do not smoke. Stay away from secondhand smoke.  ¨ Change your heating and air conditioning filter often.  ¨ Limit your use of fireplaces and wood stoves.  ¨ Get rid of pests (such as roaches and mice) and their droppings.  ¨ Throw away plants if you see mold on them.  ¨ Clean your floors. Dust regularly. Use cleaning products that do not smell.  ¨ Have someone vacuum when you are not home. Use a vacuum cleaner with a HEPA filter if possible.  ¨ Replace carpet with wood, tile, or vinyl flooring. Carpet can trap animal skin flakes and dust.  ¨ Use allergy-proof pillows, mattress covers, and box spring covers.  ¨ Wash bed sheets and blankets every week in hot water and dry them in a dryer.  ¨ Use blankets that are made of polyester or cotton.  ¨ Clean bathrooms and kitchens with bleach.  If possible, have someone repaint the walls in these rooms with mold-resistant paint. Keep out of the rooms that are being cleaned and painted.  ¨ Wash hands often.  GET HELP IF:  · You have make a whistling sound when breaking (wheeze), have shortness of breath, or have a cough even if taking medicine to prevent attacks.  · The colored mucus you cough up (sputum) is thicker than usual.  · The colored mucus you cough up changes from clear or white to yellow, green, gray, or bloody.  · You have problems from the medicine you are taking such as:  ¨ A rash.  ¨ Itching.  ¨ Swelling.  ¨ Trouble breathing.  · You need reliever medicines more than 2-3 times a week.  · Your peak flow measurement is still at 50-79% of your personal best after following the action plan for 1 hour.  GET HELP RIGHT AWAY IF:   · You seem to be worse and are not responding to medicine during an asthma attack.  · You are short of breath even at rest.  · You get short of breath when doing very little activity.  · You have trouble eating, drinking, or talking.  · You have chest pain.  · You have   a fast heartbeat.  · Your lips or fingernails start to turn blue.  · You are lightheaded, dizzy, or faint.  · Your peak flow is less than 50% of your personal best.  · You have a fever or lasting symptoms for more than 2-3 days.  · You have a fever and your symptoms suddenly get worse.  MAKE SURE YOU:   · Understand these instructions.  · Will watch your condition.  · Will get help right away if you are not doing well or get worse.  Document Released: 09/21/2007 Document Revised: 01/23/2013 Document Reviewed: 11/01/2012  [employer]® Patient Information ©2015 [employer]. This information is not intended to replace advice given to you by your health care provider. Make sure you discuss any questions you have with your health care provider.

## 2013-10-08 ENCOUNTER — Ambulatory Visit: Payer: Self-pay | Admitting: Family Medicine

## 2013-10-08 NOTE — ED Provider Notes (Signed)
Medical screening examination/treatment/procedure(s) were conducted as a shared visit with non-physician practitioner(s) and myself.  I personally evaluated the patient during the encounter.   EKG Interpretation   Date/Time:  Saturday October 05 2013 21:56:32 EDT Ventricular Rate:  101 PR Interval:  129 QRS Duration: 78 QT Interval:  345 QTC Calculation: 447 R Axis:   87 Text Interpretation:  Sinus tachycardia No previous tracing Confirmed by  STEINL  MD, Caryn BeeKEVIN ([id number]) on 10/05/2013 10:41:11 PM      Pt with hx asthma, c/o increased wheezing. Wheezing bil. Pred. Albuterol and atrovent neb.   Suzi RootsKevin E Steinl, MD 10/08/13 1539

## 2013-10-15 ENCOUNTER — Encounter: Payer: Medicaid Other | Admitting: Family Medicine

## 2013-11-22 ENCOUNTER — Other Ambulatory Visit: Payer: Self-pay | Admitting: Family Medicine

## 2013-11-28 ENCOUNTER — Other Ambulatory Visit: Payer: Self-pay | Admitting: Family Medicine

## 2013-12-27 ENCOUNTER — Other Ambulatory Visit: Payer: Self-pay | Admitting: Family Medicine

## 2013-12-30 ENCOUNTER — Inpatient Hospital Stay (HOSPITAL_COMMUNITY)
Admission: AD | Admit: 2013-12-30 | Discharge: 2013-12-30 | Disposition: A | Discharge status: Home / Self Care | Payer: Medicaid Other | Source: Ambulatory Visit | Attending: Obstetrics & Gynecology | Admitting: Obstetrics & Gynecology

## 2013-12-30 DIAGNOSIS — Z87891 Personal history of nicotine dependence: Secondary | ICD-10-CM | POA: Diagnosis not present

## 2013-12-30 DIAGNOSIS — J45909 Unspecified asthma, uncomplicated: Secondary | ICD-10-CM | POA: Diagnosis not present

## 2013-12-30 DIAGNOSIS — Z3202 Encounter for pregnancy test, result negative: Secondary | ICD-10-CM

## 2013-12-30 DIAGNOSIS — K219 Gastro-esophageal reflux disease without esophagitis: Secondary | ICD-10-CM | POA: Diagnosis not present

## 2013-12-30 DIAGNOSIS — Z3200 Encounter for pregnancy test, result unknown: Secondary | ICD-10-CM | POA: Diagnosis present

## 2013-12-30 LAB — POCT PREGNANCY, URINE: PREG TEST UR: NEGATIVE

## 2013-12-30 NOTE — MAU Note (Signed)
Pt reports LMP 08/05, wants a preg test

## 2013-12-30 NOTE — MAU Provider Note (Signed)
  History     CSN: [id number]  Arrival date and time: 12/30/13 2040   None     Chief Complaint  Patient presents with  . Possible Pregnancy   HPI Gail Walker 23 y.o. G2P1011 presents to MAU for sole purpose of requesting pregnancy test.  No other c/o.  Denies vag bleeding/abd pain/dysuria. OB History   Grav Para Term Preterm Abortions TAB SAB Ect Mult Living   0 1 1 0 0 0 1      Past Medical History  Diagnosis Date  . Chlamydia 2012  . Acne   . Asthma     uses albuterol inhaler daily  . GERD (gastroesophageal reflux disease)     during pregnancy - takes zantac    Past Surgical History  Procedure Laterality Date  . Pregnancy termination    . No past surgeries      Family History  Problem Relation Age of Onset  . Anesthesia problems Neg Hx   . Hearing loss Neg Hx   . Cancer Maternal Aunt     History  Substance Use Topics  . Smoking status: Former Smoker -- 0.25 packs/day    Quit date: 03/03/2011  . Smokeless tobacco: Never Used  . Alcohol Use: No    Allergies: No Known Allergies  Prescriptions prior to admission  Medication Sig Dispense Refill  . albuterol (PROVENTIL HFA;VENTOLIN HFA) 108 (90 BASE) MCG/ACT inhaler Inhale 2 puffs into the lungs every 6 (six) hours as needed for wheezing or shortness of breath.      Marland Kitchen PROAIR HFA 108 (90 BASE) MCG/ACT inhaler INHALE 2 PUFFS INTO THE LUNGS EVERY 6 (SIX) HOURS AS NEEDED. FOR SHORTNESS OF BREATH.  8.5 each  0  . triamcinolone ointment (KENALOG) 0.1 % APPLY TO ECZEMA TOPICALLY TWICE DAILY AS NEEDED  80 g  1    ROS pertinent ros in HPI Physical Exam   Blood pressure 135/81, pulse 93, temperature 97.5 F (36.4 C), temperature source Oral, resp. rate 16, height  (1.626 m), weight 207 lb (93.895 kg), last menstrual period 11/20/2013, SpO2 100.00%.  Physical Exam  MAU Course  Procedures none MDM Negative PRT  Assessment and Plan  A: Negative pregnancy test  P: Discharge to home Advised  condoms for all sex until able to get more reliable contraceptive. Return to MAU for OB/GYN emergency  Teague Edwena Blow 12/30/2013, 9:09 PM

## 2013-12-31 ENCOUNTER — Encounter: Payer: Medicaid Other | Admitting: Family Medicine

## 2013-12-31 NOTE — MAU Provider Note (Signed)
Attestation of Attending Supervision of Advanced Practitioner (PA/CNM/NP): Evaluation and management procedures were performed by the Advanced Practitioner under my supervision and collaboration.  I have reviewed the Advanced Practitioner's note and chart, and I agree with the management and plan.  Jacob Stinson, DO Attending Physician Faculty Practice, [facility]  

## 2014-01-07 ENCOUNTER — Ambulatory Visit: Payer: Medicaid Other | Admitting: Family Medicine

## 2014-01-08 ENCOUNTER — Other Ambulatory Visit: Payer: Self-pay | Admitting: Family Medicine

## 2014-01-09 ENCOUNTER — Other Ambulatory Visit (HOSPITAL_COMMUNITY): Payer: Self-pay | Admitting: Family Medicine

## 2014-01-13 ENCOUNTER — Other Ambulatory Visit (HOSPITAL_COMMUNITY): Payer: Self-pay | Admitting: Family Medicine

## 2014-02-17 ENCOUNTER — Encounter (HOSPITAL_COMMUNITY): Payer: Self-pay | Admitting: Emergency Medicine

## 2014-03-12 ENCOUNTER — Emergency Department (HOSPITAL_COMMUNITY)
Admission: EM | Admit: 2014-03-12 | Discharge: 2014-03-13 | Disposition: A | Discharge status: Home / Self Care | Payer: Medicaid Other | Attending: Emergency Medicine | Admitting: Emergency Medicine

## 2014-03-12 ENCOUNTER — Encounter (HOSPITAL_COMMUNITY): Payer: Self-pay | Admitting: Emergency Medicine

## 2014-03-12 ENCOUNTER — Emergency Department (HOSPITAL_COMMUNITY): Payer: Medicaid Other

## 2014-03-12 DIAGNOSIS — Z872 Personal history of diseases of the skin and subcutaneous tissue: Secondary | ICD-10-CM | POA: Insufficient documentation

## 2014-03-12 DIAGNOSIS — Z79899 Other long term (current) drug therapy: Secondary | ICD-10-CM | POA: Diagnosis not present

## 2014-03-12 DIAGNOSIS — Z87891 Personal history of nicotine dependence: Secondary | ICD-10-CM | POA: Diagnosis not present

## 2014-03-12 DIAGNOSIS — J45901 Unspecified asthma with (acute) exacerbation: Secondary | ICD-10-CM | POA: Diagnosis not present

## 2014-03-12 DIAGNOSIS — R0602 Shortness of breath: Secondary | ICD-10-CM | POA: Diagnosis present

## 2014-03-12 DIAGNOSIS — Z8619 Personal history of other infectious and parasitic diseases: Secondary | ICD-10-CM | POA: Insufficient documentation

## 2014-03-12 DIAGNOSIS — K219 Gastro-esophageal reflux disease without esophagitis: Secondary | ICD-10-CM | POA: Insufficient documentation

## 2014-03-12 MED ORDER — IPRATROPIUM BROMIDE 0.02 % IN SOLN
0.5000 mg | Freq: Once | RESPIRATORY_TRACT | Status: AC
  Administered 2014-03-12: 0.5 mg via RESPIRATORY_TRACT
  Filled 2014-03-12: qty 2.5

## 2014-03-12 MED ORDER — ALBUTEROL SULFATE (2.5 MG/3ML) 0.083% IN NEBU
5.0000 mg | INHALATION_SOLUTION | Freq: Once | RESPIRATORY_TRACT | Status: AC
  Administered 2014-03-12: 5 mg via RESPIRATORY_TRACT
  Filled 2014-03-12: qty 6

## 2014-03-12 MED ORDER — IPRATROPIUM BROMIDE 0.02 % IN SOLN
0.5000 mg | Freq: Once | RESPIRATORY_TRACT | Status: AC
  Administered 2014-03-13: 0.5 mg via RESPIRATORY_TRACT
  Filled 2014-03-12: qty 2.5

## 2014-03-12 MED ORDER — PREDNISONE 20 MG PO TABS
60.0000 mg | ORAL_TABLET | Freq: Once | ORAL | Status: AC
  Administered 2014-03-12: 60 mg via ORAL
  Filled 2014-03-12: qty 3

## 2014-03-12 MED ORDER — ALBUTEROL SULFATE (2.5 MG/3ML) 0.083% IN NEBU
5.0000 mg | INHALATION_SOLUTION | Freq: Once | RESPIRATORY_TRACT | Status: AC
  Administered 2014-03-13: 5 mg via RESPIRATORY_TRACT
  Filled 2014-03-12: qty 6

## 2014-03-12 NOTE — ED Notes (Addendum)
Pt reports asthma exacerbation x 1 week but worse today with audible wheezing. Pt reports fever at home and productive cough with runny nose.

## 2014-03-12 NOTE — ED Provider Notes (Signed)
CSN: [id number]     Arrival date & time 03/12/14  2138 History   First MD Initiated Contact with Patient 03/12/14 2217     Chief Complaint  Patient presents with  . Asthma  . Shortness of Breath     (Consider location/radiation/quality/duration/timing/severity/associated sxs/prior Treatment) HPI Comments: Patient with a history of Asthma presents today with SOB and wheezing.  Symptoms have been present for the past week and gradually worsening.  She states that she has been using Albuterol without improvement.  She reports associated productive cough and nasal congestion.  She denies fever, chills, chest pain, ear pain, sinus pain, or sore throat.  She reports that she has been hospitalized in the past for Asthma, but denies prior intubations.  The history is provided by the patient.    Past Medical History  Diagnosis Date  . Chlamydia 2012  . Acne   . Asthma     uses albuterol inhaler daily  . GERD (gastroesophageal reflux disease)     during pregnancy - takes zantac   Past Surgical History  Procedure Laterality Date  . Pregnancy termination    . No past surgeries     Family History  Problem Relation Age of Onset  . Anesthesia problems Neg Hx   . Hearing loss Neg Hx   . Cancer Maternal Aunt    History  Substance Use Topics  . Smoking status: Former Smoker -- 0.25 packs/day    Quit date: 03/03/2011  . Smokeless tobacco: Never Used  . Alcohol Use: No   OB History    Gravida Para Term Preterm AB TAB SAB Ectopic Multiple Living   2 1 1  0 1 1 0 0 0 1     Review of Systems  All other systems reviewed and are negative.     Allergies  Review of patient's allergies indicates no known allergies.  Home Medications   Prior to Admission medications   Medication Sig Start Date End Date Taking? Authorizing Provider  cetirizine (ZYRTEC) 10 MG tablet TAKE 1 TABLET BY MOUTH DAILY. 01/10/14  Yes Tobey GrimJeffrey H Walden, MD  PROAIR HFA 108 (90 BASE) MCG/ACT inhaler INHALE 2 PUFFS  INTO THE LUNGS EVERY 6 (SIX) HOURS AS NEEDED. FOR SHORTNESS OF BREATH. 01/08/14  Yes Tobey GrimJeffrey H Walden, MD  triamcinolone ointment (KENALOG) 0.1 % APPLY TO ECZEMA TOPICALLY TWICE DAILY AS NEEDED 11/28/13  Yes Tobey GrimJeffrey H Walden, MD   BP 138/77 mmHg  Pulse 104  Temp(Src) 98.6 F (37 C) (Oral)  Resp 20  SpO2 94%  LMP 02/26/2014 (Approximate) Physical Exam  Constitutional: She appears well-developed and well-nourished.  HENT:  Head: Normocephalic and atraumatic.  Right Ear: Tympanic membrane and ear canal normal.  Left Ear: Tympanic membrane and ear canal normal.  Nose: Mucosal edema and rhinorrhea present. Right sinus exhibits no maxillary sinus tenderness and no frontal sinus tenderness. Left sinus exhibits no maxillary sinus tenderness and no frontal sinus tenderness.  Mouth/Throat: Uvula is midline, oropharynx is clear and moist and mucous membranes are normal.  Neck: Normal range of motion. Neck supple.  Cardiovascular: Normal rate, regular rhythm and normal heart sounds.   Pulmonary/Chest: Effort normal. No respiratory distress. She has decreased breath sounds. She has wheezes.  Diffuse inspiratory and expiratory wheezing   Musculoskeletal: Normal range of motion.  Neurological: She is alert.  Skin: Skin is warm and dry.  Psychiatric: She has a normal mood and affect.  Nursing note and vitals reviewed.   ED Course  Procedures (including critical  care time) Labs Review Labs Reviewed - No data to display  Imaging Review Dg Chest 2 View (if Patient Has Fever And/or Copd)  03/12/2014   CLINICAL DATA:  Asthma, shortness of breath.  EXAM: CHEST  2 VIEW  COMPARISON:  10/05/2013  FINDINGS: Mild peribronchial thickening. Heart and mediastinal contours are within normal limits. No focal opacities or effusions. No acute bony abnormality.  IMPRESSION: Mild bronchitic changes.   Electronically Signed   By: Charlett NoseKevin  Dover M.D.   On: 03/12/2014 22:29     EKG Interpretation None     11:40  PM Reassessed patient. She reports that her breathing has improved.  Patient with mild diffuse wheezing.   12:37 AM Reassessed patient after breathing treatment.  She denies SOB at this time.  She reports that she feels that she is breathing at her baseline.  Patient continues to have mild wheezing.  She would like to be discharge.  Will check pulse ox while ambulating. MDM   Final diagnoses:  None   Patient ambulated in ED with O2 saturations maintained >90, no current signs of respiratory distress. Lung exam improved after nebulizer treatment. Prednisone given in the ED and pt will bd dc with 5 day burst. Pt states they are breathing at baseline. Pt has been instructed to continue using prescribed medications and to speak with PCP about today's exacerbation. Patient stable for discharge.  Return precautions given.     Santiago GladHeather Neave Lenger, PA-C 03/13/14 0126  Audree CamelScott T Goldston, MD 03/13/14 [phone]

## 2014-03-13 MED ORDER — PREDNISONE 20 MG PO TABS: 60.0000 mg | ORAL_TABLET | Freq: Every day | ORAL | Status: DC

## 2014-03-13 MED ORDER — ALBUTEROL SULFATE HFA 108 (90 BASE) MCG/ACT IN AERS: 1.0000 | INHALATION_SPRAY | Freq: Four times a day (QID) | RESPIRATORY_TRACT | Status: DC | PRN

## 2014-03-13 NOTE — ED Notes (Signed)
Pulse oximetry while ambulating, 91-94% without dyspnea.

## 2014-04-18 ENCOUNTER — Other Ambulatory Visit: Payer: Self-pay | Admitting: Family Medicine

## 2014-05-03 ENCOUNTER — Encounter (HOSPITAL_COMMUNITY): Payer: Self-pay

## 2014-05-03 ENCOUNTER — Emergency Department (HOSPITAL_COMMUNITY): Payer: Medicaid Other

## 2014-05-03 ENCOUNTER — Emergency Department (HOSPITAL_COMMUNITY)
Admission: EM | Admit: 2014-05-03 | Discharge: 2014-05-03 | Disposition: A | Discharge status: Home / Self Care | Payer: Medicaid Other | Attending: Emergency Medicine | Admitting: Emergency Medicine

## 2014-05-03 DIAGNOSIS — Z7952 Long term (current) use of systemic steroids: Secondary | ICD-10-CM | POA: Diagnosis not present

## 2014-05-03 DIAGNOSIS — Z87891 Personal history of nicotine dependence: Secondary | ICD-10-CM | POA: Diagnosis not present

## 2014-05-03 DIAGNOSIS — R062 Wheezing: Secondary | ICD-10-CM

## 2014-05-03 DIAGNOSIS — R05 Cough: Secondary | ICD-10-CM

## 2014-05-03 DIAGNOSIS — J45901 Unspecified asthma with (acute) exacerbation: Secondary | ICD-10-CM | POA: Insufficient documentation

## 2014-05-03 DIAGNOSIS — R059 Cough, unspecified: Secondary | ICD-10-CM

## 2014-05-03 DIAGNOSIS — R0602 Shortness of breath: Secondary | ICD-10-CM

## 2014-05-03 DIAGNOSIS — Z8619 Personal history of other infectious and parasitic diseases: Secondary | ICD-10-CM | POA: Diagnosis not present

## 2014-05-03 DIAGNOSIS — Z8719 Personal history of other diseases of the digestive system: Secondary | ICD-10-CM | POA: Diagnosis not present

## 2014-05-03 DIAGNOSIS — Z872 Personal history of diseases of the skin and subcutaneous tissue: Secondary | ICD-10-CM | POA: Diagnosis not present

## 2014-05-03 DIAGNOSIS — J45909 Unspecified asthma, uncomplicated: Secondary | ICD-10-CM | POA: Diagnosis present

## 2014-05-03 DIAGNOSIS — R0789 Other chest pain: Secondary | ICD-10-CM

## 2014-05-03 DIAGNOSIS — Z79899 Other long term (current) drug therapy: Secondary | ICD-10-CM | POA: Diagnosis not present

## 2014-05-03 DIAGNOSIS — Z3202 Encounter for pregnancy test, result negative: Secondary | ICD-10-CM | POA: Insufficient documentation

## 2014-05-03 LAB — URINALYSIS, ROUTINE W REFLEX MICROSCOPIC
Bilirubin Urine: NEGATIVE
Glucose, UA: NEGATIVE mg/dL
Hgb urine dipstick: NEGATIVE
Ketones, ur: NEGATIVE mg/dL
Leukocytes, UA: NEGATIVE
Nitrite: NEGATIVE
Protein, ur: NEGATIVE mg/dL
Specific Gravity, Urine: 1.028 (ref 1.005–1.030)
Urobilinogen, UA: 1 mg/dL (ref 0.0–1.0)
pH: 5.5 (ref 5.0–8.0)

## 2014-05-03 LAB — CBC WITH DIFFERENTIAL/PLATELET
BASOS PCT: 1 % (ref 0–1)
Basophils Absolute: 0 10*3/uL (ref 0.0–0.1)
Eosinophils Absolute: 0.7 10*3/uL (ref 0.0–0.7)
Eosinophils Relative: 12 % — ABNORMAL HIGH (ref 0–5)
HEMATOCRIT: 39.6 % (ref 36.0–46.0)
Hemoglobin: 13.6 g/dL (ref 12.0–15.0)
Lymphocytes Relative: 20 % (ref 12–46)
Lymphs Abs: 1.3 10*3/uL (ref 0.7–4.0)
MCH: 28 pg (ref 26.0–34.0)
MCHC: 34.3 g/dL (ref 30.0–36.0)
MCV: 81.6 fL (ref 78.0–100.0)
MONO ABS: 0.1 10*3/uL (ref 0.1–1.0)
MONOS PCT: 2 % — AB (ref 3–12)
NEUTROS PCT: 65 % (ref 43–77)
Neutro Abs: 4.1 10*3/uL (ref 1.7–7.7)
PLATELETS: 288 10*3/uL (ref 150–400)
RBC: 4.85 MIL/uL (ref 3.87–5.11)
RDW: 13.7 % (ref 11.5–15.5)
WBC: 6.3 10*3/uL (ref 4.0–10.5)

## 2014-05-03 LAB — BASIC METABOLIC PANEL
Anion gap: 7 (ref 5–15)
BUN: 8 mg/dL (ref 6–23)
CO2: 23 mmol/L (ref 19–32)
Calcium: 8.7 mg/dL (ref 8.4–10.5)
Chloride: 104 mEq/L (ref 96–112)
Creatinine, Ser: 0.75 mg/dL (ref 0.50–1.10)
GFR calc Af Amer: >90 mL/min (ref >90)
GFR calc non Af Amer: >90 mL/min (ref >90)
Glucose, Bld: 107 mg/dL — ABNORMAL HIGH (ref 70–99)
POTASSIUM: 3.7 mmol/L (ref 3.5–5.1)
SODIUM: 134 mmol/L — AB (ref 135–145)

## 2014-05-03 LAB — POC URINE PREG, ED: Preg Test, Ur: NEGATIVE

## 2014-05-03 MED ORDER — ALBUTEROL SULFATE HFA 108 (90 BASE) MCG/ACT IN AERS: 2.0000 | INHALATION_SPRAY | RESPIRATORY_TRACT | Status: DC | PRN

## 2014-05-03 MED ORDER — LORATADINE 10 MG PO TABS: 10.0000 mg | ORAL_TABLET | Freq: Every day | ORAL | Status: DC

## 2014-05-03 MED ORDER — PREDNISONE 20 MG PO TABS: ORAL_TABLET | ORAL | Status: DC

## 2014-05-03 MED ORDER — ALBUTEROL (5 MG/ML) CONTINUOUS INHALATION SOLN
10.0000 mg/h | INHALATION_SOLUTION | RESPIRATORY_TRACT | Status: DC
  Administered 2014-05-03: 10 mg/h via RESPIRATORY_TRACT

## 2014-05-03 NOTE — Discharge Instructions (Signed)
Continue to stay well-hydrated. Use Mucinex for cough suppression/expectoration of mucus. Use netipot and flonase to help with nasal congestion. May consider over-the-counter Benadryl or other antihistamine to decrease secretions and for watery itchy eyes. Use inhaler as directed, as needed for cough/chest congestion/wheezing/shortness of breath. Use prednisone as directed. Followup with your primary care doctor in 5-7 days for recheck of ongoing symptoms. Return to emergency department for emergent changing or worsening of symptoms.   Asthma Asthma is a condition of the lungs in which the airways tighten and narrow. Asthma can make it hard to breathe. Asthma cannot be cured, but medicine and lifestyle changes can help control it. Asthma may be started (triggered) by: 1. Animal skin flakes (dander). 2. Dust. 3. Cockroaches. 4. Pollen. 5. Mold. 6. Smoke. 7. Cleaning products. 8. Hair sprays or aerosol sprays. 9. Paint fumes or strong smells. 10. Cold air, weather changes, and winds. 11. Crying or laughing hard. 12. Stress. 13. Certain medicines or drugs. 14. Foods, such as dried fruit, potato chips, and sparkling grape juice. 15. Infections or conditions (colds, flu). 16. Exercise. 17. Certain medical conditions or diseases. 18. Exercise or tiring activities. HOME CARE   Take medicine as told by your doctor.  Use a peak flow meter as told by your doctor. A peak flow meter is a tool that measures how well the lungs are working.  Record and keep track of the peak flow meter's readings.  Understand and use the asthma action plan. An asthma action plan is a written plan for taking care of your asthma and treating your attacks.  To help prevent asthma attacks:  Do not smoke. Stay away from secondhand smoke.  Change your heating and air conditioning filter often.  Limit your use of fireplaces and wood stoves.  Get rid of pests (such as roaches and mice) and their droppings.  Throw  away plants if you see mold on them.  Clean your floors. Dust regularly. Use cleaning products that do not smell.  Have someone vacuum when you are not home. Use a vacuum cleaner with a HEPA filter if possible.  Replace carpet with wood, tile, or vinyl flooring. Carpet can trap animal skin flakes and dust.  Use allergy-proof pillows, mattress covers, and box spring covers.  Wash bed sheets and blankets every week in hot water and dry them in a dryer.  Use blankets that are made of polyester or cotton.  Clean bathrooms and kitchens with bleach. If possible, have someone repaint the walls in these rooms with mold-resistant paint. Keep out of the rooms that are being cleaned and painted.  Wash hands often. GET HELP IF:  You have make a whistling sound when breaking (wheeze), have shortness of breath, or have a cough even if taking medicine to prevent attacks.  The colored mucus you cough up (sputum) is thicker than usual.  The colored mucus you cough up changes from clear or white to yellow, green, gray, or bloody.  You have problems from the medicine you are taking such as:  A rash.  Itching.  Swelling.  Trouble breathing.  You need reliever medicines more than 2-3 times a week.  Your peak flow measurement is still at 50-79% of your personal best after following the action plan for 1 hour.  You have a fever. GET HELP RIGHT AWAY IF:   You seem to be worse and are not responding to medicine during an asthma attack.  You are short of breath even at rest.  You  get short of breath when doing very little activity.  You have trouble eating, drinking, or talking.  You have chest pain.  You have a fast heartbeat.  Your lips or fingernails start to turn blue.  You are light-headed, dizzy, or faint.  Your peak flow is less than 50% of your personal best. MAKE SURE YOU:   Understand these instructions.  Will watch your condition.  Will get help right away if you are  not doing well or get worse. Document Released: 09/21/2007 Document Revised: 08/19/2013 Document Reviewed: 11/01/2012 [facility] Patient Information 2015 [place], [place]. This information is not intended to replace advice given to you by your health care provider. Make sure you discuss any questions you have with your health care provider.  How to Use an Inhaler Using your inhaler correctly is very important. Good technique will make sure that the medicine reaches your lungs.  HOW TO USE AN INHALER: 19. Take the cap off the inhaler. 20. If this is the first time using your inhaler, you need to prime it. Shake the inhaler for 5 seconds. Release four puffs into the air, away from your face. Ask your doctor for help if you have questions. 21. Shake the inhaler for 5 seconds. 22. Turn the inhaler so the bottle is above the mouthpiece. 23. Put your pointer finger on top of the bottle. Your thumb holds the bottom of the inhaler. 24. Open your mouth. 25. Either hold the inhaler away from your mouth (the width of 2 fingers) or place your lips tightly around the mouthpiece. Ask your doctor which way to use your inhaler. 26. Breathe out as much air as possible. 27. Breathe in and push down on the bottle 1 time to release the medicine. You will feel the medicine go in your mouth and throat. 28. Continue to take a deep breath in very slowly. Try to fill your lungs. 29. After you have breathed in completely, hold your breath for 10 seconds. This will help the medicine to settle in your lungs. If you cannot hold your breath for 10 seconds, hold it for as long as you can before you breathe out. 30. Breathe out slowly, through pursed lips. Whistling is an example of pursed lips. 31. If your doctor has told you to take more than 1 puff, wait at least 15-30 seconds between puffs. This will help you get the best results from your medicine. Do not use the inhaler more than your doctor tells you to. 32. Put the cap  back on the inhaler. 33. Follow the directions from your doctor or from the inhaler package about cleaning the inhaler. If you use more than one inhaler, ask your doctor which inhalers to use and what order to use them in. Ask your doctor to help you figure out when you will need to refill your inhaler.  If you use a steroid inhaler, always rinse your mouth with water after your last puff, gargle and spit out the water. Do not swallow the water. GET HELP IF:  The inhaler medicine only partially helps to stop wheezing or shortness of breath.  You are having trouble using your inhaler.  You have some increase in thick spit (phlegm). GET HELP RIGHT AWAY IF:  The inhaler medicine does not help your wheezing or shortness of breath or you have tightness in your chest.  You have dizziness, headaches, or fast heart rate.  You have chills, fever, or night sweats.  You have a large increase of  thick spit, or your thick spit is bloody. MAKE SURE YOU:   Understand these instructions.  Will watch your condition.  Will get help right away if you are not doing well or get worse. Document Released: 01/12/2008 Document Revised: 01/23/2013 Document Reviewed: 11/01/2012 [facility] Patient Information 2015 [place]. This information is not intended to replace advice given to you by your health care provider. Make sure you discuss any questions you have with your health care provider.

## 2014-05-03 NOTE — ED Notes (Signed)
Pulse ox 96% while ambulating  

## 2014-05-03 NOTE — ED Notes (Signed)
Bed: WA11 Expected date:  Expected time:  Means of arrival:  Comments: ems 

## 2014-05-03 NOTE — ED Notes (Signed)
Patient reports she feels better now.  Requesting food and fluids.

## 2014-05-03 NOTE — ED Provider Notes (Signed)
CSN: [id number]     Arrival date & time 05/03/14  1056 History   First MD Initiated Contact with Patient 05/03/14 1107     Chief Complaint  Patient presents with  . Asthma     (Consider location/radiation/quality/duration/timing/severity/associated sxs/prior Treatment) HPI Comments: Gail Walker is a 24 y.o. female with a PMHx of asthma, who presents to the ED with complaints of cough with yellow sputum production, shortness of breath, wheezing, and chest tightness ongoing 1 week. Has a history of asthma, and she has monthly flareups when the weather changes, and this feels very consistent with a flare. She reports that she's been using her inhalers at home with minimal relief, and she has now run out of these inhalers. She states that weather changes aggravate her symptoms, but she has not tried any other medications aside from her inhaler. She denies any fevers, chills, chest pain, hemoptysis, rhinorrhea, ear pain or drainage, eye pain or drainage, sore throat, B swelling, abdominal pain, nausea, vomiting, diarrhea, constipation, dysuria, hematuria, numbness, tingling, weakness, recent travel, or sick contacts. Denies any prior intubations, and her last asthma exacerbation was 2 months ago. She has never needed hospitalization for her asthma. She was given 2 DuoNeb's and IV Solu-Medrol by EMS prior to arrival, and states that these helped minimally but she still feels her symptoms.  Patient is a 24 y.o. female presenting with wheezing. The history is provided by the patient. No language interpreter was used.  Wheezing Severity:  Severe Severity compared to prior episodes:  Similar Onset quality:  Gradual Duration:  2 days Timing:  Constant Progression:  Unchanged Chronicity:  Recurrent Context: exposure to allergen   Relieved by:  Nothing Exacerbated by: weather changes. Ineffective treatments:  Beta-agonist inhaler Associated symptoms: chest tightness, cough, shortness of breath and  sputum production   Associated symptoms: no chest pain, no ear pain, no fever, no foot swelling, no headaches, no orthopnea, no PND, no rash, no rhinorrhea, no sore throat, no stridor and no swollen glands   Risk factors: no prior hospitalizations, no prior ICU admissions and no prior intubations     Past Medical History  Diagnosis Date  . Chlamydia 2012  . Acne   . Asthma     uses albuterol inhaler daily  . GERD (gastroesophageal reflux disease)     during pregnancy - takes zantac   Past Surgical History  Procedure Laterality Date  . Pregnancy termination    . No past surgeries     Family History  Problem Relation Age of Onset  . Anesthesia problems Neg Hx   . Hearing loss Neg Hx   . Cancer Maternal Aunt    History  Substance Use Topics  . Smoking status: Former Smoker -- 0.25 packs/day    Quit date: 03/03/2011  . Smokeless tobacco: Never Used  . Alcohol Use: No   OB History    Gravida Para Term Preterm AB TAB SAB Ectopic Multiple Living   0 1 1 0 0 0 1     Review of Systems  Constitutional: Negative for fever and chills.  HENT: Negative for drooling, ear pain, rhinorrhea, sore throat and trouble swallowing.   Eyes: Negative for pain, discharge and itching.  Respiratory: Positive for cough, sputum production, chest tightness, shortness of breath and wheezing. Negative for stridor.   Cardiovascular: Negative for chest pain, palpitations, orthopnea, leg swelling and PND.  Gastrointestinal: Negative for nausea, vomiting, abdominal pain and diarrhea.  Genitourinary: Negative for dysuria and  hematuria.  Musculoskeletal: Negative for myalgias, back pain and arthralgias.  Skin: Negative for rash.  Allergic/Immunologic: Positive for environmental allergies. Negative for immunocompromised state.  Neurological: Negative for weakness, light-headedness, numbness and headaches.  Hematological: Negative for adenopathy.      Allergies  Review of patient's allergies  indicates no known allergies.  Home Medications   Prior to Admission medications   Medication Sig Start Date End Date Taking? Authorizing Provider  albuterol (PROVENTIL HFA;VENTOLIN HFA) 108 (90 BASE) MCG/ACT inhaler Inhale 1 puff into the lungs every 6 (six) hours as needed for wheezing or shortness of breath.   Yes Historical Provider, MD  cetirizine (ZYRTEC) 10 MG tablet TAKE 1 TABLET BY MOUTH DAILY. Patient not taking: Reported on 05/03/2014 01/10/14   Tobey Grim, MD  predniSONE (DELTASONE) 20 MG tablet Take 3 tablets (60 mg total) by mouth daily. Patient not taking: Reported on 05/03/2014 03/13/14   Heather Laisure, PA-C  PROAIR HFA 108 (90 BASE) MCG/ACT inhaler INHALE 2 PUFFS INTO THE LUNGS EVERY 6 (SIX) HOURS AS NEEDED. FOR SHORTNESS OF BREATH. Patient not taking: Reported on 05/03/2014 04/22/14   Uvaldo Rising, MD  triamcinolone ointment (KENALOG) 0.1 % APPLY TO ECZEMA TOPICALLY TWICE DAILY AS NEEDED Patient not taking: Reported on 05/03/2014 11/28/13   Tobey Grim, MD   BP 107/53 mmHg  Pulse 94  Temp(Src) 97.8 F (36.6 C) (Oral)  Resp 20  SpO2 100%  LMP 04/23/2014 (Exact Date) Physical Exam  Constitutional: She is oriented to person, place, and time. Vital signs are normal. She appears well-developed and well-nourished.  Non-toxic appearance. No distress.  Afebrile nontoxic, NAD, VSS, speaking in full sentences without hypoxia on RA  HENT:  Head: Normocephalic and atraumatic.  Right Ear: Hearing, tympanic membrane, external ear and ear canal normal.  Left Ear: Hearing, tympanic membrane, external ear and ear canal normal.  Nose: Mucosal edema present.  Mouth/Throat: Uvula is midline, oropharynx is clear and moist and mucous membranes are normal. No trismus in the jaw. No uvula swelling.  Ears clear, nose with mild nasal turbinate edema without erythema or rhinorrhea, oropharynx clear  Eyes: Conjunctivae and EOM are normal. Right eye exhibits no discharge. Left eye  exhibits no discharge.  Neck: Normal range of motion. Neck supple.  Cardiovascular: Normal rate, regular rhythm, normal heart sounds and intact distal pulses.  Exam reveals no gallop and no friction rub.   No murmur heard. Pulmonary/Chest: Effort normal. No respiratory distress. She has no decreased breath sounds. She has wheezes. She has no rhonchi. She has no rales. She exhibits no retraction.  Diffuse expiratory wheezing and mild rhonchi which clear with cough No decreased breath sounds Speaking in full sentences on RA, no hypoxia, no increased WOB, no retraction  Abdominal: Soft. Normal appearance and bowel sounds are normal. She exhibits no distension. There is no tenderness. There is no rigidity, no rebound, no guarding and no tenderness at McBurney's point.  Musculoskeletal: Normal range of motion.  MAE x4 No pedal edema  Lymphadenopathy:       Head (right side): No submandibular and no tonsillar adenopathy present.       Head (left side): No submandibular and no tonsillar adenopathy present.    She has no cervical adenopathy.  No head/neck LAD  Neurological: She is alert and oriented to person, place, and time. She has normal strength. No sensory deficit.  Skin: Skin is warm, dry and intact. No rash noted.  Psychiatric: She has a normal mood and  affect.  Nursing note and vitals reviewed.   ED Course  Procedures (including critical care time) Labs Review Labs Reviewed  CBC WITH DIFFERENTIAL - Abnormal; Notable for the following:    Monocytes Relative 2 (*)    Eosinophils Relative 12 (*)    All other components within normal limits  BASIC METABOLIC PANEL - Abnormal; Notable for the following:    Sodium 134 (*)    Glucose, Bld 107 (*)    All other components within normal limits  URINALYSIS, ROUTINE W REFLEX MICROSCOPIC - Abnormal; Notable for the following:    APPearance CLOUDY (*)    All other components within normal limits  POC URINE PREG, ED    Imaging Review Dg  Chest 2 View  05/03/2014   CLINICAL DATA:  Initial encounter for shortness of breath. Cough. Intermittent fever.  EXAM: CHEST  2 VIEW  COMPARISON:  03/12/2014  FINDINGS: Midline trachea.  Normal heart size and mediastinal contours.  Sharp costophrenic angles.  No pneumothorax.  Clear lungs.  IMPRESSION: No active cardiopulmonary disease.   Electronically Signed   By: Jeronimo Greaves M.D.   On: 05/03/2014 12:23     EKG Interpretation None      MDM   Final diagnoses:  Wheezing  Cough  Chest tightness  SOB (shortness of breath)  Asthma exacerbation    24 y.o. female with asthma exacerbation, given 2 duonebs and iv solumedrol en route but still with significant wheezing and chest tightness. VSS and NAD but given amount of wheezing, will give continuous nebs. Will get labs and CXR in case pt needs admission. Will reassess shortly.  2:05 PM Pt feeling improved, wheezing improved although some rhonchi that clear with cough still present. CXR unremarkable, labs all unremarkable. Ambulated without desaturations. Will send home with prednisone taper and albuterol inhaler, will have her f/up with her PCP in 1wk. I explained the diagnosis and have given explicit precautions to return to the ER including for any other new or worsening symptoms. The patient understands and accepts the medical plan as it's been dictated and I have answered their questions. Discharge instructions concerning home care and prescriptions have been given. The patient is STABLE and is discharged to home in good condition.  BP 127/64 mmHg  Pulse 105  Temp(Src) 97.6 F (36.4 C) (Oral)  Resp 20  SpO2 97%  LMP 04/23/2014 (Exact Date)  Meds ordered this encounter  Medications  . albuterol (PROVENTIL,VENTOLIN) solution continuous neb    Sig:   . albuterol (PROVENTIL HFA;VENTOLIN HFA) 108 (90 BASE) MCG/ACT inhaler    Sig: Inhale 2 puffs into the lungs every 2 (two) hours as needed for wheezing or shortness of breath (cough).     Dispense:  1 Inhaler    Refill:  0    Order Specific Question:  Supervising Provider    Answer:  Eber Hong D [[id number]]  . loratadine (CLARITIN) 10 MG tablet    Sig: Take 1 tablet (10 mg total) by mouth daily. One po daily x 5 days    Dispense:  30 tablet    Refill:  0    Order Specific Question:  Supervising Provider    Answer:  Eber Hong D [[id number]]  . predniSONE (DELTASONE) 20 MG tablet    Sig: 3 tabs po daily x 3 days    Dispense:  9 tablet    Refill:  0    Order Specific Question:  Supervising Provider    Answer:  Eber Hong D [[id number]]  Donnita FallsMercedes Strupp Hooperamprubi-Soms, PA-C 05/03/14 1443  Derwood KaplanAnkit Nanavati, MD 05/04/14 [phone]

## 2014-05-03 NOTE — ED Notes (Signed)
She c/o uri sx plus some wheezing x 1 week--wheezes more pronounced, plus some shortness of breath x 2 days.  She arrives [facility]  She is in no distress; and states "The treatment hasn't helped much yet".  She also received 125 Methylprednisolone IV en route to hospital.

## 2014-05-16 ENCOUNTER — Other Ambulatory Visit: Payer: Self-pay | Admitting: Family Medicine

## 2014-06-01 ENCOUNTER — Emergency Department (HOSPITAL_COMMUNITY)
Admission: EM | Admit: 2014-06-01 | Discharge: 2014-06-01 | Discharge status: Against Medical Advice | Payer: Medicaid Other

## 2014-06-01 NOTE — ED Notes (Signed)
Pt called x 3  No answer. 

## 2014-07-01 ENCOUNTER — Emergency Department (HOSPITAL_COMMUNITY)
Admission: EM | Admit: 2014-07-01 | Discharge: 2014-07-01 | Disposition: A | Discharge status: Home / Self Care | Payer: Medicaid Other | Attending: Emergency Medicine | Admitting: Emergency Medicine

## 2014-07-01 ENCOUNTER — Encounter (HOSPITAL_COMMUNITY): Payer: Self-pay

## 2014-07-01 DIAGNOSIS — R000 Tachycardia, unspecified: Secondary | ICD-10-CM | POA: Insufficient documentation

## 2014-07-01 DIAGNOSIS — Z87891 Personal history of nicotine dependence: Secondary | ICD-10-CM | POA: Insufficient documentation

## 2014-07-01 DIAGNOSIS — Y9289 Other specified places as the place of occurrence of the external cause: Secondary | ICD-10-CM | POA: Insufficient documentation

## 2014-07-01 DIAGNOSIS — Z7952 Long term (current) use of systemic steroids: Secondary | ICD-10-CM | POA: Insufficient documentation

## 2014-07-01 DIAGNOSIS — X58XXXA Exposure to other specified factors, initial encounter: Secondary | ICD-10-CM | POA: Insufficient documentation

## 2014-07-01 DIAGNOSIS — Z8719 Personal history of other diseases of the digestive system: Secondary | ICD-10-CM | POA: Insufficient documentation

## 2014-07-01 DIAGNOSIS — Z872 Personal history of diseases of the skin and subcutaneous tissue: Secondary | ICD-10-CM | POA: Insufficient documentation

## 2014-07-01 DIAGNOSIS — T7840XA Allergy, unspecified, initial encounter: Secondary | ICD-10-CM

## 2014-07-01 DIAGNOSIS — Y999 Unspecified external cause status: Secondary | ICD-10-CM | POA: Insufficient documentation

## 2014-07-01 DIAGNOSIS — J45909 Unspecified asthma, uncomplicated: Secondary | ICD-10-CM | POA: Insufficient documentation

## 2014-07-01 DIAGNOSIS — Z79899 Other long term (current) drug therapy: Secondary | ICD-10-CM | POA: Insufficient documentation

## 2014-07-01 DIAGNOSIS — Z8619 Personal history of other infectious and parasitic diseases: Secondary | ICD-10-CM | POA: Insufficient documentation

## 2014-07-01 DIAGNOSIS — Y9389 Activity, other specified: Secondary | ICD-10-CM | POA: Insufficient documentation

## 2014-07-01 MED ORDER — CETIRIZINE-PSEUDOEPHEDRINE ER 5-120 MG PO TB12: 1.0000 | ORAL_TABLET | Freq: Two times a day (BID) | ORAL | Status: DC

## 2014-07-01 NOTE — Discharge Instructions (Signed)
Allergies °Allergies may happen from anything your body is sensitive to. This may be food, medicines, pollens, chemicals, and nearly anything around you in everyday life that produces allergens. An allergen is anything that causes an allergy producing substance. Heredity is often a factor in causing these problems. This means you may have some of the same allergies as your parents. °Food allergies happen in all age groups. Food allergies are some of the most severe and life threatening. Some common food allergies are cow's milk, seafood, eggs, nuts, wheat, and soybeans. °SYMPTOMS  °· Swelling around the mouth. °· An itchy red rash or hives. °· Vomiting or diarrhea. °· Difficulty breathing. °SEVERE ALLERGIC REACTIONS ARE LIFE-THREATENING. °This reaction is called anaphylaxis. It can cause the mouth and throat to swell and cause difficulty with breathing and swallowing. In severe reactions only a trace amount of food (for example, peanut oil in a salad) may cause death within seconds. °Seasonal allergies occur in all age groups. These are seasonal because they usually occur during the same season every year. They may be a reaction to molds, grass pollens, or tree pollens. Other causes of problems are house dust mite allergens, pet dander, and mold spores. The symptoms often consist of nasal congestion, a runny itchy nose associated with sneezing, and tearing itchy eyes. There is often an associated itching of the mouth and ears. The problems happen when you come in contact with pollens and other allergens. Allergens are the particles in the air that the body reacts to with an allergic reaction. This causes you to release allergic antibodies. Through a chain of events, these eventually cause you to release histamine into the blood stream. Although it is meant to be protective to the body, it is this release that causes your discomfort. This is why you were given anti-histamines to feel better.  If you are unable to  pinpoint the offending allergen, it may be determined by skin or blood testing. Allergies cannot be cured but can be controlled with medicine. °Hay fever is a collection of all or some of the seasonal allergy problems. It may often be treated with simple over-the-counter medicine such as diphenhydramine. Take medicine as directed. Do not drink alcohol or drive while taking this medicine. Check with your caregiver or package insert for child dosages. °If these medicines are not effective, there are many new medicines your caregiver can prescribe. Stronger medicine such as nasal spray, eye drops, and corticosteroids may be used if the first things you try do not work well. Other treatments such as immunotherapy or desensitizing injections can be used if all else fails. Follow up with your caregiver if problems continue. These seasonal allergies are usually not life threatening. They are generally more of a nuisance that can often be handled using medicine. °HOME CARE INSTRUCTIONS  °· If unsure what causes a reaction, keep a diary of foods eaten and symptoms that follow. Avoid foods that cause reactions. °· If hives or rash are present: °¨ Take medicine as directed. °¨ You may use an over-the-counter antihistamine (diphenhydramine) for hives and itching as needed. °¨ Apply cold compresses (cloths) to the skin or take baths in cool water. Avoid hot baths or showers. Heat will make a rash and itching worse. °· If you are severely allergic: °¨ Following a treatment for a severe reaction, hospitalization is often required for closer follow-up. °¨ Wear a medic-alert bracelet or necklace stating the allergy. °¨ You and your family must learn how to give adrenaline or use   an anaphylaxis kit.  If you have had a severe reaction, always carry your anaphylaxis kit or EpiPen with you. Use this medicine as directed by your caregiver if a severe reaction is occurring. Failure to do so could have a fatal outcome. SEEK MEDICAL  CARE IF:  You suspect a food allergy. Symptoms generally happen within 30 minutes of eating a food.  Your symptoms have not gone away within 2 days or are getting worse.  You develop new symptoms.  You want to retest yourself or your child with a food or drink you think causes an allergic reaction. Never do this if an anaphylactic reaction to that food or drink has happened before. Only do this under the care of a caregiver. SEEK IMMEDIATE MEDICAL CARE IF:   You have difficulty breathing, are wheezing, or have a tight feeling in your chest or throat.  You have a swollen mouth, or you have hives, swelling, or itching all over your body.  You have had a severe reaction that has responded to your anaphylaxis kit or an EpiPen. These reactions may return when the medicine has worn off. These reactions should be considered life threatening. MAKE SURE YOU:   Understand these instructions.  Will watch your condition.  Will get help right away if you are not doing well or get worse. Document Released: 06/28/2002 Document Revised: 07/30/2012 Document Reviewed: 12/03/2007 [facility] Patient Information 2015 [place], [place]. This information is not intended to replace advice given to you by your health care provider. Make sure you discuss any questions you have with your health care provider. Take the Zyrtec D for a week and then switch to plain Zyrtec

## 2014-07-01 NOTE — ED Notes (Signed)
Patient reports her allergies have been "acting up" the past week and Benadryl has not relieved symptoms.  She has a stuffy nose.  States she used to take Zyrtec, but needs to see her PMD for a prescription.

## 2014-07-01 NOTE — ED Provider Notes (Signed)
CSN: [id number]     Arrival date & time 07/01/14  0400 History   First MD Initiated Contact with Patient 07/01/14 0441     Chief Complaint  Patient presents with  . Allergies     (Consider location/radiation/quality/duration/timing/severity/associated sxs/prior Treatment) HPI Comments: Patient with year-round seasonal allergies, worse at certain times of the year.  Normally takes Zyrtec that she gets prescribed by her primary care physician.  She did not realize that surgical history over-the-counter and she does not need a prescription  The history is provided by the patient.    Past Medical History  Diagnosis Date  . Chlamydia 2012  . Acne   . Asthma     uses albuterol inhaler daily  . GERD (gastroesophageal reflux disease)     during pregnancy - takes zantac   Past Surgical History  Procedure Laterality Date  . Pregnancy termination    . No past surgeries     Family History  Problem Relation Age of Onset  . Anesthesia problems Neg Hx   . Hearing loss Neg Hx   . Cancer Maternal Aunt    History  Substance Use Topics  . Smoking status: Former Smoker -- 0.25 packs/day    Quit date: 03/03/2011  . Smokeless tobacco: Never Used  . Alcohol Use: No   OB History    Gravida Para Term Preterm AB TAB SAB Ectopic Multiple Living   2 1 1  0 1 1 0 0 0 1     Review of Systems  Constitutional: Negative for fever and chills.  HENT: Positive for congestion and postnasal drip.   Respiratory: Negative for cough and shortness of breath.   All other systems reviewed and are negative.     Allergies  Review of patient's allergies indicates no known allergies.  Home Medications   Prior to Admission medications   Medication Sig Start Date End Date Taking? Authorizing Provider  albuterol (PROVENTIL HFA;VENTOLIN HFA) 108 (90 BASE) MCG/ACT inhaler Inhale 1 puff into the lungs every 6 (six) hours as needed for wheezing or shortness of breath.    Historical Provider, MD  albuterol  (PROVENTIL HFA;VENTOLIN HFA) 108 (90 BASE) MCG/ACT inhaler Inhale 2 puffs into the lungs every 2 (two) hours as needed for wheezing or shortness of breath (cough). 05/03/14   Mercedes Camprubi-Soms, PA-C  cetirizine (ZYRTEC) 10 MG tablet TAKE 1 TABLET BY MOUTH DAILY. Patient not taking: Reported on 05/03/2014 01/10/14   Tobey GrimJeffrey H Walden, MD  cetirizine-pseudoephedrine (ZYRTEC-D) 5-120 MG per tablet Take 1 tablet by mouth 2 (two) times daily. 07/01/14   Earley FavorGail Kycen Spalla, NP  loratadine (CLARITIN) 10 MG tablet Take 1 tablet (10 mg total) by mouth daily. One po daily x 5 days 05/03/14   Mercedes Camprubi-Soms, PA-C  predniSONE (DELTASONE) 20 MG tablet Take 3 tablets (60 mg total) by mouth daily. Patient not taking: Reported on 05/03/2014 03/13/14   Santiago GladHeather Laisure, PA-C  predniSONE (DELTASONE) 20 MG tablet 3 tabs po daily x 3 days 05/03/14   Mercedes Camprubi-Soms, PA-C  PROAIR HFA 108 (90 BASE) MCG/ACT inhaler INHALE 2 PUFFS INTO THE LUNGS EVERY 6 (SIX) HOURS AS NEEDED. FOR SHORTNESS OF BREATH. Patient not taking: Reported on 05/03/2014 04/22/14   Uvaldo RisingKyle J Fletke, MD  triamcinolone ointment (KENALOG) 0.1 % APPLY TO ECZEMA TOPICALLY TWICE DAILY AS NEEDED 05/16/14   Tobey GrimJeffrey H Walden, MD   BP 132/73 mmHg  Pulse 101  Temp(Src) 97.8 F (36.6 C) (Oral)  Resp 18  Ht 5\' 3"  (1.6 m)  Wt 175 lb (79.379 kg)  BMI 31.01 kg/m2  SpO2 99%  LMP 06/08/2014 (Approximate) Physical Exam  Constitutional: She appears well-developed and well-nourished.  HENT:  Head: Normocephalic.  Right Ear: External ear normal.  Left Ear: External ear normal.  Mouth/Throat: Oropharynx is clear and moist.  Nasal congestion  Neck: Normal range of motion.  Cardiovascular: Regular rhythm.  Tachycardia present.   Pulmonary/Chest: Effort normal. She has no wheezes.  Abdominal: Soft.  Musculoskeletal: Normal range of motion.  Neurological: She is alert.  Nursing note and vitals reviewed.   ED Course  Procedures (including critical care  time) Labs Review Labs Reviewed - No data to display  Imaging Review No results found.   EKG Interpretation None      MDM   Final diagnoses:  Allergy, initial encounter         Earley Favor, NP 07/01/14 2956  Tomasita Crumble, MD 07/01/14 [phone]

## 2014-07-08 ENCOUNTER — Emergency Department (HOSPITAL_COMMUNITY): Payer: Medicaid Other

## 2014-07-08 ENCOUNTER — Encounter (HOSPITAL_COMMUNITY): Payer: Self-pay | Admitting: Emergency Medicine

## 2014-07-08 ENCOUNTER — Emergency Department (HOSPITAL_COMMUNITY)
Admission: EM | Admit: 2014-07-08 | Discharge: 2014-07-08 | Disposition: A | Discharge status: Home / Self Care | Payer: Medicaid Other | Attending: Emergency Medicine | Admitting: Emergency Medicine

## 2014-07-08 DIAGNOSIS — Z87891 Personal history of nicotine dependence: Secondary | ICD-10-CM | POA: Insufficient documentation

## 2014-07-08 DIAGNOSIS — Z872 Personal history of diseases of the skin and subcutaneous tissue: Secondary | ICD-10-CM | POA: Insufficient documentation

## 2014-07-08 DIAGNOSIS — Z79899 Other long term (current) drug therapy: Secondary | ICD-10-CM | POA: Insufficient documentation

## 2014-07-08 DIAGNOSIS — Z8719 Personal history of other diseases of the digestive system: Secondary | ICD-10-CM | POA: Insufficient documentation

## 2014-07-08 DIAGNOSIS — R0602 Shortness of breath: Secondary | ICD-10-CM

## 2014-07-08 DIAGNOSIS — J45901 Unspecified asthma with (acute) exacerbation: Secondary | ICD-10-CM

## 2014-07-08 DIAGNOSIS — Z8619 Personal history of other infectious and parasitic diseases: Secondary | ICD-10-CM | POA: Insufficient documentation

## 2014-07-08 MED ORDER — ALBUTEROL SULFATE HFA 108 (90 BASE) MCG/ACT IN AERS: 2.0000 | INHALATION_SPRAY | RESPIRATORY_TRACT | Status: DC | PRN

## 2014-07-08 MED ORDER — IPRATROPIUM BROMIDE 0.02 % IN SOLN
0.5000 mg | Freq: Once | RESPIRATORY_TRACT | Status: AC
  Administered 2014-07-08: 0.5 mg via RESPIRATORY_TRACT
  Filled 2014-07-08: qty 2.5

## 2014-07-08 MED ORDER — ALBUTEROL SULFATE (2.5 MG/3ML) 0.083% IN NEBU
5.0000 mg | INHALATION_SOLUTION | Freq: Once | RESPIRATORY_TRACT | Status: AC
  Administered 2014-07-08: 5 mg via RESPIRATORY_TRACT
  Filled 2014-07-08: qty 6

## 2014-07-08 MED ORDER — ALBUTEROL SULFATE HFA 108 (90 BASE) MCG/ACT IN AERS
2.0000 | INHALATION_SPRAY | RESPIRATORY_TRACT | Status: DC | PRN
  Administered 2014-07-08: 2 via RESPIRATORY_TRACT
  Filled 2014-07-08: qty 6.7

## 2014-07-08 MED ORDER — PREDNISONE 20 MG PO TABS: 60.0000 mg | ORAL_TABLET | Freq: Every day | ORAL | Status: DC

## 2014-07-08 NOTE — ED Notes (Signed)
Per EMS: pt c/o SOB and wheezing. Albuterol given by FIRE, 125 mg solumedrol and duoneb given by EMS. 20 g left AC.

## 2014-07-08 NOTE — ED Notes (Signed)
Bed: WA03 Expected date:  Expected time:  Means of arrival:  Comments: EMS- SOB, IV established

## 2014-07-08 NOTE — Discharge Instructions (Signed)
It was our pleasure to provide your ER care today - we hope that you feel better.  Use albuterol inhaler as need. Take prednisone as prescribed.  If allergy symptoms, you may try claritin or zyrtec as need.  Follow up with primary care doctor in the next couple days if symptoms fail to improve/resolve.  Return to ER if worse, new symptoms, fevers, chest pain, trouble breathing, other concern.        Asthma Asthma is a recurring condition in which the airways tighten and narrow. Asthma can make it difficult to breathe. It can cause coughing, wheezing, and shortness of breath. Asthma episodes, also called asthma attacks, range from minor to life-threatening. Asthma cannot be cured, but medicines and lifestyle changes can help control it. CAUSES Asthma is believed to be caused by inherited (genetic) and environmental factors, but its exact cause is unknown. Asthma may be triggered by allergens, lung infections, or irritants in the air. Asthma triggers are different for each person. Common triggers include:   Animal dander.  Dust mites.  Cockroaches.  Pollen from trees or grass.  Mold.  Smoke.  Air pollutants such as dust, household cleaners, hair sprays, aerosol sprays, paint fumes, strong chemicals, or strong odors.  Cold air, weather changes, and winds (which increase molds and pollens in the air).  Strong emotional expressions such as crying or laughing hard.  Stress.  Certain medicines (such as aspirin) or types of drugs (such as beta-blockers).  Sulfites in foods and drinks. Foods and drinks that may contain sulfites include dried fruit, potato chips, and sparkling grape juice.  Infections or inflammatory conditions such as the flu, a cold, or an inflammation of the nasal membranes (rhinitis).  Gastroesophageal reflux disease (GERD).  Exercise or strenuous activity. SYMPTOMS Symptoms may occur immediately after asthma is triggered or many hours later. Symptoms  include:  Wheezing.  Excessive nighttime or early morning coughing.  Frequent or severe coughing with a common cold.  Chest tightness.  Shortness of breath. DIAGNOSIS  The diagnosis of asthma is made by a review of your medical history and a physical exam. Tests may also be performed. These may include:  Lung function studies. These tests show how much air you breathe in and out.  Allergy tests.  Imaging tests such as X-rays. TREATMENT  Asthma cannot be cured, but it can usually be controlled. Treatment involves identifying and avoiding your asthma triggers. It also involves medicines. There are 2 classes of medicine used for asthma treatment:   Controller medicines. These prevent asthma symptoms from occurring. They are usually taken every day.  Reliever or rescue medicines. These quickly relieve asthma symptoms. They are used as needed and provide short-term relief. Your health care provider will help you create an asthma action plan. An asthma action plan is a written plan for managing and treating your asthma attacks. It includes a list of your asthma triggers and how they may be avoided. It also includes information on when medicines should be taken and when their dosage should be changed. An action plan may also involve the use of a device called a peak flow meter. A peak flow meter measures how well the lungs are working. It helps you monitor your condition. HOME CARE INSTRUCTIONS   Take medicines only as directed by your health care provider. Speak with your health care provider if you have questions about how or when to take the medicines.  Use a peak flow meter as directed by your health care provider.  Record and keep track of readings.  Understand and use the action plan to help minimize or stop an asthma attack without needing to seek medical care.  Control your home environment in the following ways to help prevent asthma attacks:  Do not smoke. Avoid being exposed to  secondhand smoke.  Change your heating and air conditioning filter regularly.  Limit your use of fireplaces and wood stoves.  Get rid of pests (such as roaches and mice) and their droppings.  Throw away plants if you see mold on them.  Clean your floors and dust regularly. Use unscented cleaning products.  Try to have someone else vacuum for you regularly. Stay out of rooms while they are being vacuumed and for a short while afterward. If you vacuum, use a dust mask from a hardware store, a double-layered or microfilter vacuum cleaner bag, or a vacuum cleaner with a HEPA filter.  Replace carpet with wood, tile, or vinyl flooring. Carpet can trap dander and dust.  Use allergy-proof pillows, mattress covers, and box spring covers.  Wash bed sheets and blankets every week in hot water and dry them in a dryer.  Use blankets that are made of polyester or cotton.  Clean bathrooms and kitchens with bleach. If possible, have someone repaint the walls in these rooms with mold-resistant paint. Keep out of the rooms that are being cleaned and painted.  Wash hands frequently. SEEK MEDICAL CARE IF:   You have wheezing, shortness of breath, or a cough even if taking medicine to prevent attacks.  The colored mucus you cough up (sputum) is thicker than usual.  Your sputum changes from clear or white to yellow, green, gray, or bloody.  You have any problems that may be related to the medicines you are taking (such as a rash, itching, swelling, or trouble breathing).  You are using a reliever medicine more than 2-3 times per week.  Your peak flow is still at 50-79% of your personal best after following your action plan for 1 hour.  You have a fever. SEEK IMMEDIATE MEDICAL CARE IF:   You seem to be getting worse and are unresponsive to treatment during an asthma attack.  You are short of breath even at rest.  You get short of breath when doing very little physical activity.  You have  difficulty eating, drinking, or talking due to asthma symptoms.  You develop chest pain.  You develop a fast heartbeat.  You have a bluish color to your lips or fingernails.  You are light-headed, dizzy, or faint.  Your peak flow is less than 50% of your personal best. MAKE SURE YOU:   Understand these instructions.  Will watch your condition.  Will get help right away if you are not doing well or get worse. Document Released: 04/04/2005 Document Revised: 08/19/2013 Document Reviewed: 11/01/2012 [facility] Patient Information 2015 [place], [place]. This information is not intended to replace advice given to you by your health care provider. Make sure you discuss any questions you have with your health care provider.     Asthma Attack Prevention Although there is no way to prevent asthma from starting, you can take steps to control the disease and reduce its symptoms. Learn about your asthma and how to control it. Take an active role to control your asthma by working with your health care provider to create and follow an asthma action plan. An asthma action plan guides you in:  Taking your medicines properly.  Avoiding things that set off  your asthma or make your asthma worse (asthma triggers).  Tracking your level of asthma control.  Responding to worsening asthma.  Seeking emergency care when needed. To track your asthma, keep records of your symptoms, check your peak flow number using a handheld device that shows how well air moves out of your lungs (peak flow meter), and get regular asthma checkups.  WHAT ARE SOME WAYS TO PREVENT AN ASTHMA ATTACK?  Take medicines as directed by your health care provider.  Keep track of your asthma symptoms and level of control.  With your health care provider, write a detailed plan for taking medicines and managing an asthma attack. Then be sure to follow your action plan. Asthma is an ongoing condition that needs regular monitoring and  treatment.  Identify and avoid asthma triggers. Many outdoor allergens and irritants (such as pollen, mold, cold air, and air pollution) can trigger asthma attacks. Find out what your asthma triggers are and take steps to avoid them.  Monitor your breathing. Learn to recognize warning signs of an attack, such as coughing, wheezing, or shortness of breath. Your lung function may decrease before you notice any signs or symptoms, so regularly measure and record your peak airflow with a home peak flow meter.  Identify and treat attacks early. If you act quickly, you are less likely to have a severe attack. You will also need less medicine to control your symptoms. When your peak flow measurements decrease and alert you to an upcoming attack, take your medicine as instructed and immediately stop any activity that may have triggered the attack. If your symptoms do not improve, get medical help.  Pay attention to increasing quick-relief inhaler use. If you find yourself relying on your quick-relief inhaler, your asthma is not under control. See your health care provider about adjusting your treatment. WHAT CAN MAKE MY SYMPTOMS WORSE? A number of common things can set off or make your asthma symptoms worse and cause temporary increased inflammation of your airways. Keep track of your asthma symptoms for several weeks, detailing all the environmental and emotional factors that are linked with your asthma. When you have an asthma attack, go back to your asthma diary to see which factor, or combination of factors, might have contributed to it. Once you know what these factors are, you can take steps to control many of them. If you have allergies and asthma, it is important to take asthma prevention steps at home. Minimizing contact with the substance to which you are allergic will help prevent an asthma attack. Some triggers and ways to avoid these triggers are: Animal Dander:  Some people are allergic to the  flakes of skin or dried saliva from animals with fur or feathers.   There is no such thing as a hypoallergenic dog or cat breed. All dogs or cats can cause allergies, even if they don't shed.  Keep these pets out of your home.  If you are not able to keep a pet outdoors, keep the pet out of your bedroom and other sleeping areas at all times, and keep the door closed.  Remove carpets and furniture covered with cloth from your home. If that is not possible, keep the pet away from fabric-covered furniture and carpets. Dust Mites: Many people with asthma are allergic to dust mites. Dust mites are tiny bugs that are found in every home in mattresses, pillows, carpets, fabric-covered furniture, bedcovers, clothes, stuffed toys, and other fabric-covered items.   Cover your mattress in a  special dust-proof cover.  Cover your pillow in a special dust-proof cover, or wash the pillow each week in hot water. Water must be hotter than 130 F (54.4 C) to kill dust mites. Cold or warm water used with detergent and bleach can also be effective.  Wash the sheets and blankets on your bed each week in hot water.  Try not to sleep or lie on cloth-covered cushions.  Call ahead when traveling and ask for a smoke-free hotel room. Bring your own bedding and pillows in case the hotel only supplies feather pillows and down comforters, which may contain dust mites and cause asthma symptoms.  Remove carpets from your bedroom and those laid on concrete, if you can.  Keep stuffed toys out of the bed, or wash the toys weekly in hot water or cooler water with detergent and bleach. Cockroaches: Many people with asthma are allergic to the droppings and remains of cockroaches.   Keep food and garbage in closed containers. Never leave food out.  Use poison baits, traps, powders, gels, or paste (for example, boric acid).  If a spray is used to kill cockroaches, stay out of the room until the odor goes away. Indoor  Mold:  Fix leaky faucets, pipes, or other sources of water that have mold around them.  Clean floors and moldy surfaces with a fungicide or diluted bleach.  Avoid using humidifiers, vaporizers, or swamp coolers. These can spread molds through the air. Pollen and Outdoor Mold:  When pollen or mold spore counts are high, try to keep your windows closed.  Stay indoors with windows closed from late morning to afternoon. Pollen and some mold spore counts are highest at that time.  Ask your health care provider whether you need to take anti-inflammatory medicine or increase your dose of the medicine before your allergy season starts. Other Irritants to Avoid:  Tobacco smoke is an irritant. If you smoke, ask your health care provider how you can quit. Ask family members to quit smoking, too. Do not allow smoking in your home or car.  If possible, do not use a wood-burning stove, kerosene heater, or fireplace. Minimize exposure to all sources of smoke, including incense, candles, fires, and fireworks.  Try to stay away from strong odors and sprays, such as perfume, talcum powder, hair spray, and paints.  Decrease humidity in your home and use an indoor air cleaning device. Reduce indoor humidity to below 60%. Dehumidifiers or central air conditioners can do this.  Decrease house dust exposure by changing furnace and air cooler filters frequently.  Try to have someone else vacuum for you once or twice a week. Stay out of rooms while they are being vacuumed and for a short while afterward.  If you vacuum, use a dust mask from a hardware store, a double-layered or microfilter vacuum cleaner bag, or a vacuum cleaner with a HEPA filter.  Sulfites in foods and beverages can be irritants. Do not drink beer or wine or eat dried fruit, processed potatoes, or shrimp if they cause asthma symptoms.  Cold air can trigger an asthma attack. Cover your nose and mouth with a scarf on cold or windy  days.  Several health conditions can make asthma more difficult to manage, including a runny nose, sinus infections, reflux disease, psychological stress, and sleep apnea. Work with your health care provider to manage these conditions.  Avoid close contact with people who have a respiratory infection such as a cold or the flu, since your asthma  symptoms may get worse if you catch the infection. Wash your hands thoroughly after touching items that may have been handled by people with a respiratory infection.  Get a flu shot every year to protect against the flu virus, which often makes asthma worse for days or weeks. Also get a pneumonia shot if you have not previously had one. Unlike the flu shot, the pneumonia shot does not need to be given yearly. Medicines:  Talk to your health care provider about whether it is safe for you to take aspirin or non-steroidal anti-inflammatory medicines (NSAIDs). In a small number of people with asthma, aspirin and NSAIDs can cause asthma attacks. These medicines must be avoided by people who have known aspirin-sensitive asthma. It is important that people with aspirin-sensitive asthma read labels of all over-the-counter medicines used to treat pain, colds, coughs, and fever.  Beta-blockers and ACE inhibitors are other medicines you should discuss with your health care provider. HOW CAN I FIND OUT WHAT I AM ALLERGIC TO? Ask your asthma health care provider about allergy skin testing or blood testing (the RAST test) to identify the allergens to which you are sensitive. If you are found to have allergies, the most important thing to do is to try to avoid exposure to any allergens that you are sensitive to as much as possible. Other treatments for allergies, such as medicines and allergy shots (immunotherapy) are available.  CAN I EXERCISE? Follow your health care provider's advice regarding asthma treatment before exercising. It is important to maintain a regular  exercise program, but vigorous exercise or exercise in cold, humid, or dry environments can cause asthma attacks, especially for those people who have exercise-induced asthma. Document Released: 03/23/2009 Document Revised: 04/09/2013 Document Reviewed: 10/10/2012 [facility] Patient Information 2015 [place]. This information is not intended to replace advice given to you by your health care provider. Make sure you discuss any questions you have with your health care provider.

## 2014-07-08 NOTE — ED Provider Notes (Signed)
CSN: [id number]     Arrival date & time 07/08/14  1213 History   First MD Initiated Contact with Patient 07/08/14 1220     Chief Complaint  Patient presents with  . Shortness of Breath     (Consider location/radiation/quality/duration/timing/severity/associated sxs/prior Treatment) Patient is a 24 y.o. female presenting with shortness of breath. The history is provided by the patient and the EMS personnel.  Shortness of Breath Associated symptoms: cough and wheezing   Associated symptoms: no abdominal pain, no chest pain, no fever, no headaches, no neck pain, no rash, no sore throat and no vomiting   pt with hx asthma, c/o sob and increased wheezing in the past few days.  Pt states pollen typically bothers her this time of year. +non productive cough. No sore throat or runny nose. Denies fever or chills. No chest pain or discomfort. No leg pain or swelling. Feels similar to prior asthma exacerbations. Notes single remote prior admit for asthma. No recent steroid use. Use mdi w only mild relief pta.      Past Medical History  Diagnosis Date  . Chlamydia 2012  . Acne   . Asthma     uses albuterol inhaler daily  . GERD (gastroesophageal reflux disease)     during pregnancy - takes zantac   Past Surgical History  Procedure Laterality Date  . Pregnancy termination    . No past surgeries     Family History  Problem Relation Age of Onset  . Anesthesia problems Neg Hx   . Hearing loss Neg Hx   . Cancer Maternal Aunt    History  Substance Use Topics  . Smoking status: Former Smoker -- 0.25 packs/day    Quit date: 03/03/2011  . Smokeless tobacco: Never Used  . Alcohol Use: No   OB History    Gravida Para Term Preterm AB TAB SAB Ectopic Multiple Living   2 1 1  0 1 1 0 0 0 1     Review of Systems  Constitutional: Negative for fever and chills.  HENT: Negative for sore throat.   Eyes: Negative for redness.  Respiratory: Positive for cough, shortness of breath and wheezing.    Cardiovascular: Negative for chest pain.  Gastrointestinal: Negative for vomiting, abdominal pain and diarrhea.  Endocrine: Negative for polyuria.  Genitourinary: Negative for flank pain.  Musculoskeletal: Negative for back pain and neck pain.  Skin: Negative for rash.  Neurological: Negative for headaches.  Hematological: Does not bruise/bleed easily.  Psychiatric/Behavioral: Negative for confusion.      Allergies  Review of patient's allergies indicates no known allergies.  Home Medications   Prior to Admission medications   Medication Sig Start Date End Date Taking? Authorizing Provider  albuterol (PROVENTIL HFA;VENTOLIN HFA) 108 (90 BASE) MCG/ACT inhaler Inhale 1 puff into the lungs every 6 (six) hours as needed for wheezing or shortness of breath.    Historical Provider, MD  albuterol (PROVENTIL HFA;VENTOLIN HFA) 108 (90 BASE) MCG/ACT inhaler Inhale 2 puffs into the lungs every 2 (two) hours as needed for wheezing or shortness of breath (cough). 05/03/14   Mercedes Camprubi-Soms, PA-C  cetirizine (ZYRTEC) 10 MG tablet TAKE 1 TABLET BY MOUTH DAILY. Patient not taking: Reported on 05/03/2014 01/10/14   Tobey GrimJeffrey H Walden, MD  cetirizine-pseudoephedrine (ZYRTEC-D) 5-120 MG per tablet Take 1 tablet by mouth 2 (two) times daily. 07/01/14   Earley FavorGail Schulz, NP  loratadine (CLARITIN) 10 MG tablet Take 1 tablet (10 mg total) by mouth daily. One po daily x  5 days 05/03/14   Mercedes Camprubi-Soms, PA-C  predniSONE (DELTASONE) 20 MG tablet Take 3 tablets (60 mg total) by mouth daily. Patient not taking: Reported on 05/03/2014 03/13/14   Santiago Glad, PA-C  predniSONE (DELTASONE) 20 MG tablet 3 tabs po daily x 3 days 05/03/14   Mercedes Camprubi-Soms, PA-C  PROAIR HFA 108 (90 BASE) MCG/ACT inhaler INHALE 2 PUFFS INTO THE LUNGS EVERY 6 (SIX) HOURS AS NEEDED. FOR SHORTNESS OF BREATH. Patient not taking: Reported on 05/03/2014 04/22/14   Uvaldo Rising, MD  triamcinolone ointment (KENALOG) 0.1 % APPLY TO  ECZEMA TOPICALLY TWICE DAILY AS NEEDED 05/16/14   Tobey Grim, MD   BP 129/92 mmHg  Pulse 78  Temp(Src) 98.1 F (36.7 C) (Oral)  Resp 20  Ht  (1.626 m)  Wt 175 lb (79.379 kg)  BMI 30.02 kg/m2  SpO2 100%  LMP 06/08/2014 (Approximate) Physical Exam  Constitutional: She is oriented to person, place, and time. She appears well-developed and well-nourished. No distress.  HENT:  Mouth/Throat: Oropharynx is clear and moist.  Eyes: Conjunctivae are normal. No scleral icterus.  Neck: Neck supple. No tracheal deviation present.  Cardiovascular: Normal rate, regular rhythm, normal heart sounds and intact distal pulses.   Pulmonary/Chest: Effort normal. No respiratory distress. She has wheezes.  Abdominal: Soft. Normal appearance and bowel sounds are normal. She exhibits no distension.  Genitourinary:  No cva tenderness  Musculoskeletal: She exhibits no edema or tenderness.  Neurological: She is alert and oriented to person, place, and time.  Skin: Skin is warm and dry. No rash noted. She is not diaphoretic.  Psychiatric: She has a normal mood and affect.  Nursing note and vitals reviewed.   ED Course  Procedures (including critical care time) Labs Review  Dg Chest 2 View  07/08/2014   CLINICAL DATA:  Shortness of breath, cough x1 week, dizziness  EXAM: CHEST - 2 VIEW  COMPARISON:  05/03/2014  FINDINGS: Lungs are clear, mildly hyperinflated. Heart size and mediastinal contours are within normal limits. No effusion.  No pneumothorax. Visualized skeletal structures are unremarkable.  IMPRESSION: No acute cardiopulmonary disease.   Electronically Signed   By: Corlis Leak M.D.   On: 07/08/2014 14:03      MDM   Iv ns. Albuterol and atrovent neb.  Pt was given solumedrol iv prior to arrival.  Reviewed nursing notes and prior charts for additional history.   Alb and atrovent neb.  Recheck breathing comfortably, good air exchange, no increased wob.    Pt currently feels much  improved and stable for d/c.  Pt indicates out of home mdi.  Will give mdi and rx for home alb and pred.  Return precautions provided.    Cathren Laine, MD 07/08/14 [phone]

## 2014-08-05 ENCOUNTER — Ambulatory Visit: Payer: Medicaid Other | Admitting: Family Medicine

## 2014-11-06 ENCOUNTER — Inpatient Hospital Stay (HOSPITAL_COMMUNITY)
Admission: AD | Admit: 2014-11-06 | Discharge: 2014-11-06 | Discharge status: Against Medical Advice | Payer: Medicaid Other | Source: Ambulatory Visit | Attending: Obstetrics & Gynecology | Admitting: Obstetrics & Gynecology

## 2014-11-06 NOTE — MAU Note (Signed)
Called not in lobby x1

## 2014-11-06 NOTE — MAU Note (Signed)
Not in lobby x 3  

## 2014-11-06 NOTE — MAU Note (Signed)
Not in lobby x2.

## 2014-11-24 ENCOUNTER — Encounter (HOSPITAL_COMMUNITY): Payer: Self-pay

## 2014-11-24 ENCOUNTER — Emergency Department (HOSPITAL_COMMUNITY)
Admission: EM | Admit: 2014-11-24 | Discharge: 2014-11-24 | Disposition: A | Discharge status: Home / Self Care | Payer: Medicaid Other | Attending: Emergency Medicine | Admitting: Emergency Medicine

## 2014-11-24 DIAGNOSIS — Z87891 Personal history of nicotine dependence: Secondary | ICD-10-CM | POA: Insufficient documentation

## 2014-11-24 DIAGNOSIS — Z7952 Long term (current) use of systemic steroids: Secondary | ICD-10-CM | POA: Diagnosis not present

## 2014-11-24 DIAGNOSIS — Z3202 Encounter for pregnancy test, result negative: Secondary | ICD-10-CM | POA: Insufficient documentation

## 2014-11-24 DIAGNOSIS — Z8719 Personal history of other diseases of the digestive system: Secondary | ICD-10-CM | POA: Insufficient documentation

## 2014-11-24 DIAGNOSIS — L0291 Cutaneous abscess, unspecified: Secondary | ICD-10-CM

## 2014-11-24 DIAGNOSIS — A5901 Trichomonal vulvovaginitis: Secondary | ICD-10-CM | POA: Insufficient documentation

## 2014-11-24 DIAGNOSIS — L989 Disorder of the skin and subcutaneous tissue, unspecified: Secondary | ICD-10-CM | POA: Diagnosis present

## 2014-11-24 DIAGNOSIS — Z79899 Other long term (current) drug therapy: Secondary | ICD-10-CM | POA: Insufficient documentation

## 2014-11-24 DIAGNOSIS — J45909 Unspecified asthma, uncomplicated: Secondary | ICD-10-CM | POA: Diagnosis not present

## 2014-11-24 DIAGNOSIS — A599 Trichomoniasis, unspecified: Secondary | ICD-10-CM

## 2014-11-24 LAB — GC/CHLAMYDIA PROBE AMP (~~LOC~~) NOT AT ARMC
Chlamydia: NEGATIVE
Neisseria Gonorrhea: NEGATIVE

## 2014-11-24 LAB — URINALYSIS, ROUTINE W REFLEX MICROSCOPIC
Bilirubin Urine: NEGATIVE
GLUCOSE, UA: NEGATIVE mg/dL
Hgb urine dipstick: NEGATIVE
Ketones, ur: NEGATIVE mg/dL
NITRITE: NEGATIVE
PH: 7 (ref 5.0–8.0)
PROTEIN: NEGATIVE mg/dL
Specific Gravity, Urine: 1.029 (ref 1.005–1.030)
Urobilinogen, UA: 1 mg/dL (ref 0.0–1.0)

## 2014-11-24 LAB — WET PREP, GENITAL
CLUE CELLS WET PREP: NONE SEEN
Yeast Wet Prep HPF POC: NONE SEEN

## 2014-11-24 LAB — URINE MICROSCOPIC-ADD ON

## 2014-11-24 LAB — POC URINE PREG, ED: PREG TEST UR: NEGATIVE

## 2014-11-24 MED ORDER — AZITHROMYCIN 250 MG PO TABS
1000.0000 mg | ORAL_TABLET | Freq: Once | ORAL | Status: AC
  Administered 2014-11-24: 1000 mg via ORAL
  Filled 2014-11-24: qty 4

## 2014-11-24 MED ORDER — METRONIDAZOLE 500 MG PO TABS
2000.0000 mg | ORAL_TABLET | Freq: Once | ORAL | Status: AC
  Administered 2014-11-24: 2000 mg via ORAL
  Filled 2014-11-24: qty 4

## 2014-11-24 MED ORDER — CEFTRIAXONE SODIUM 250 MG IJ SOLR
250.0000 mg | Freq: Once | INTRAMUSCULAR | Status: AC
  Administered 2014-11-24: 250 mg via INTRAMUSCULAR
  Filled 2014-11-24: qty 250

## 2014-11-24 MED ORDER — LIDOCAINE HCL (PF) 1 % IJ SOLN
INTRAMUSCULAR | Status: AC
  Administered 2014-11-24: 2 mL
  Filled 2014-11-24: qty 5

## 2014-11-24 MED ORDER — CHLORHEXIDINE GLUCONATE 2 % EX SOLN: 1.0000 "application " | CUTANEOUS | Status: DC

## 2014-11-24 NOTE — ED Provider Notes (Signed)
TIME SEEN: 3:51 AM  CHIEF COMPLAINT: Skin problem   HPI:  HPI Comments: Gail Walker is a 24 y.o. female brought in by ambulance, who presents to the Emergency Department complaining of intermittent, gradually improving areas of pain and swelling to bilateral axillary region and lower abdomen onset 2 months ago. Pt reports associated white drainage from the affected areas. Denies fevers, PMhx of DM, or recent steroid use (last use was 2 months ago). She additionally reports white vaginal discharge for 1 week. LNMP was 9 days ago. Pt notes 1 current female sexually partner and states they intermittently use protection. Denies vaginal bleeding. Has had Chlamydia in the past has been treated.  ROS: See HPI Constitutional: no fever  Eyes: no drainage  ENT: no runny nose   Cardiovascular:  no chest pain  Resp: no SOB  GI: no vomiting GU: no dysuria Integumentary: no rash  Allergy: no hives  Musculoskeletal: no leg swelling  Neurological: no slurred speech ROS otherwise negative  PAST MEDICAL HISTORY/PAST SURGICAL HISTORY:  Past Medical History  Diagnosis Date  . Chlamydia 2012  . Acne   . Asthma     uses albuterol inhaler daily  . GERD (gastroesophageal reflux disease)     during pregnancy - takes zantac    MEDICATIONS:  Prior to Admission medications   Medication Sig Start Date End Date Taking? Authorizing Provider  albuterol (PROVENTIL HFA;VENTOLIN HFA) 108 (90 BASE) MCG/ACT inhaler Inhale 2 puffs into the lungs every 2 (two) hours as needed for wheezing or shortness of breath (cough). 05/03/14   Mercedes Camprubi-Soms, PA-C  albuterol (PROVENTIL HFA;VENTOLIN HFA) 108 (90 BASE) MCG/ACT inhaler Inhale 2 puffs into the lungs every 4 (four) hours as needed for wheezing or shortness of breath. 07/08/14   Cathren Laine, MD  cetirizine (ZYRTEC) 10 MG tablet TAKE 1 TABLET BY MOUTH DAILY. 01/10/14   Tobey Grim, MD  cetirizine-pseudoephedrine (ZYRTEC-D) 5-120 MG per tablet Take 1 tablet  by mouth 2 (two) times daily. 07/01/14   Earley Favor, NP  loratadine (CLARITIN) 10 MG tablet Take 1 tablet (10 mg total) by mouth daily. One po daily x 5 days 05/03/14   Mercedes Camprubi-Soms, PA-C  predniSONE (DELTASONE) 20 MG tablet Take 3 tablets (60 mg total) by mouth daily. 07/08/14   Cathren Laine, MD  triamcinolone ointment (KENALOG) 0.1 % APPLY TO ECZEMA TOPICALLY TWICE DAILY AS NEEDED 05/16/14   Tobey Grim, MD    ALLERGIES:  No Known Allergies  SOCIAL HISTORY:  History  Substance Use Topics  . Smoking status: Former Smoker -- 0.25 packs/day    Quit date: 03/03/2011  . Smokeless tobacco: Never Used  . Alcohol Use: No    FAMILY HISTORY: Family History  Problem Relation Age of Onset  . Anesthesia problems Neg Hx   . Hearing loss Neg Hx   . Cancer Maternal Aunt     EXAM: BP 123/83 mmHg  Pulse 89  Temp(Src) 97.8 F (36.6 C) (Oral)  Resp 16  Ht 5\' 4"  (1.626 m)  Wt 189 lb (85.73 kg)  BMI 32.43 kg/m2  SpO2 98%  LMP 11/15/2014 (Exact Date) CONSTITUTIONAL: Alert and oriented and responds appropriately to questions. Well-appearing; well-nourished HEAD: Normocephalic EYES: Conjunctivae clear, PERRL ENT: normal nose; no rhinorrhea; moist mucous membranes; pharynx without lesions noted NECK: Supple, no meningismus, no LAD  CARD: RRR; S1 and S2 appreciated; no murmurs, no clicks, no rubs, no gallops RESP: Normal chest excursion without splinting or tachypnea; breath sounds clear and equal  bilaterally; no wheezes, no rhonchi, no rales, no hypoxia or respiratory distress, speaking full sentences ABD/GI: Normal bowel sounds; non-distended; soft, non-tender, no rebound, no guarding, no peritoneal signs 3 small papular lesions to the lower abdomen that are consistent with small abscesses that have previously drained GU:  Normal external genitalia, moderate amount of white discharge, no vaginal bleeding, no cervical motion tenderness, no adnexal tenderness or fullness BACK:  The  back appears normal and is non-tender to palpation, there is no CVA tenderness EXT: Normal ROM in all joints; non-tender to palpation; no edema; normal capillary refill; no cyanosis, no calf tenderness or swelling, no abscesses in the axilla    SKIN: Normal color for age and race; warm  NEURO: Moves all extremities equally, sensation to light touch intact diffusely, cranial nerves II through XII intact PSYCH: The patient's mood and manner are appropriate. Grooming and personal hygiene are appropriate.  MEDICAL DECISION MAKING: Patient here with small abscesses to her lower abdomen. Nothing that needs drainage at this time. No fluctuance. No sign of cellulitis. We'll discharge with chlorhexidine wash. I do not feel she needs antibiotics. Also complains of vaginal discharge. She is not pregnant but is positive for Trichomonas. Have treated with Flagyl 2 g and have also empirically treated for gonorrhea and chlamydia with ceftriaxone and azithromycin. Have recommended she follow-up with her PCP or the health department for HIV, syphilis testing. She has no abdominal tenderness on exam, no adnexal tenderness or cervical motion tenderness. Discussed usual and customary return precautions. She verbalized understanding and is comfortable with plan.       Gail Maw Marialice Newkirk, DO 11/24/14 [phone]

## 2014-11-24 NOTE — Discharge Instructions (Signed)
Abscess An abscess is an infected area that contains a collection of pus and debris.It can occur in almost any part of the body. An abscess is also known as a furuncle or boil. CAUSES  An abscess occurs when tissue gets infected. This can occur from blockage of oil or sweat glands, infection of hair follicles, or a minor injury to the skin. As the body tries to fight the infection, pus collects in the area and creates pressure under the skin. This pressure causes pain. People with weakened immune systems have difficulty fighting infections and get certain abscesses more often.  SYMPTOMS Usually an abscess develops on the skin and becomes a painful mass that is red, warm, and tender. If the abscess forms under the skin, you may feel a moveable soft area under the skin. Some abscesses break open (rupture) on their own, but most will continue to get worse without care. The infection can spread deeper into the body and eventually into the bloodstream, causing you to feel ill.  DIAGNOSIS  Your caregiver will take your medical history and perform a physical exam. A sample of fluid may also be taken from the abscess to determine what is causing your infection. TREATMENT  Your caregiver may prescribe antibiotic medicines to fight the infection. However, taking antibiotics alone usually does not cure an abscess. Your caregiver may need to make a small cut (incision) in the abscess to drain the pus. In some cases, gauze is packed into the abscess to reduce pain and to continue draining the area. HOME CARE INSTRUCTIONS   Only take over-the-counter or prescription medicines for pain, discomfort, or fever as directed by your caregiver.  If you were prescribed antibiotics, take them as directed. Finish them even if you start to feel better.  If gauze is used, follow your caregiver's directions for changing the gauze.  To avoid spreading the infection:  Keep your draining abscess covered with a  bandage.  Wash your hands well.  Do not share personal care items, towels, or whirlpools with others.  Avoid skin contact with others.  Keep your skin and clothes clean around the abscess.  Keep all follow-up appointments as directed by your caregiver. SEEK MEDICAL CARE IF:   You have increased pain, swelling, redness, fluid drainage, or bleeding.  You have muscle aches, chills, or a general ill feeling.  You have a fever. MAKE SURE YOU:   Understand these instructions.  Will watch your condition.  Will get help right away if you are not doing well or get worse. Document Released: 01/12/2005 Document Revised: 10/04/2011 Document Reviewed: 06/17/2011 [facility] Patient Information 2015 [place], [place]. This information is not intended to replace advice given to you by your health care provider. Make sure you discuss any questions you have with your health care provider.  Trichomoniasis Trichomoniasis is an infection caused by an organism called Trichomonas. The infection can affect both women and men. In women, the outer female genitalia and the vagina are affected. In men, the penis is mainly affected, but the prostate and other reproductive organs can also be involved. Trichomoniasis is a sexually transmitted infection (STI) and is most often passed to another person through sexual contact.  RISK FACTORS  Having unprotected sexual intercourse.  Having sexual intercourse with an infected partner. SIGNS AND SYMPTOMS  Symptoms of trichomoniasis in women include:  Abnormal gray-green frothy vaginal discharge.  Itching and irritation of the vagina.  Itching and irritation of the area outside the vagina. Symptoms of trichomoniasis in  men include:   Penile discharge with or without pain.  Pain during urination. This results from inflammation of the urethra. DIAGNOSIS  Trichomoniasis may be found during a Pap test or physical exam. Your health care provider may use one of the  following methods to help diagnose this infection:  Examining vaginal discharge under a microscope. For men, urethral discharge would be examined.  Testing the pH of the vagina with a test tape.  Using a vaginal swab test that checks for the Trichomonas organism. A test is available that provides results within a few minutes.  Doing a culture test for the organism. This is not usually needed. TREATMENT   You may be given medicine to fight the infection. Women should inform their health care provider if they could be or are pregnant. Some medicines used to treat the infection should not be taken during pregnancy.  Your health care provider may recommend over-the-counter medicines or creams to decrease itching or irritation.  Your sexual partner will need to be treated if infected. HOME CARE INSTRUCTIONS   Take medicines only as directed by your health care provider.  Take over-the-counter medicine for itching or irritation as directed by your health care provider.  Do not have sexual intercourse while you have the infection.  Women should not douche or wear tampons while they have the infection.  Discuss your infection with your partner. Your partner may have gotten the infection from you, or you may have gotten it from your partner.  Have your sex partner get examined and treated if necessary.  Practice safe, informed, and protected sex.  See your health care provider for other STI testing. SEEK MEDICAL CARE IF:   You still have symptoms after you finish your medicine.  You develop abdominal pain.  You have pain when you urinate.  You have bleeding after sexual intercourse.  You develop a rash.  Your medicine makes you sick or makes you throw up (vomit). MAKE SURE YOU:  Understand these instructions.  Will watch your condition.  Will get help right away if you are not doing well or get worse. Document Released: 09/28/2000 Document Revised: 08/19/2013 Document  Reviewed: 01/14/2013 [facility] Patient Information 2015 [place], [place]. This information is not intended to replace advice given to you by your health care provider. Make sure you discuss any questions you have with your health care provider.

## 2014-11-24 NOTE — ED Notes (Addendum)
Patient states she "has boils" under her armpit and on lower abdomen that appeared while she was in jail. Started under arms, appeared on lower abdomen more recently. Patient states that this has not happened before.   EMS vitals BP 132/76. Pulse 74, HR 18. Hx of asthma. Uses an albuterol inhaler. No known allergies.

## 2014-12-03 ENCOUNTER — Telehealth: Payer: Self-pay | Admitting: Family Medicine

## 2014-12-03 NOTE — Telephone Encounter (Signed)
Pt calling because she was contacted about an hour ago and was told to "call back and speak to a nurse or CMA to relay the message". Did not see a phone note. Please advise, Dorothey Baseman, ASA

## 2014-12-12 NOTE — Telephone Encounter (Signed)
Will send message to MD to see if there was a reason patient was needing to contact us. Jazmin Hartsell,CMA

## 2014-12-12 NOTE — Telephone Encounter (Signed)
Not that I know of.  Will close encounter.

## 2014-12-17 ENCOUNTER — Other Ambulatory Visit: Payer: Self-pay | Admitting: Family Medicine

## 2015-02-11 ENCOUNTER — Other Ambulatory Visit: Payer: Self-pay | Admitting: Family Medicine

## 2015-02-14 ENCOUNTER — Emergency Department (HOSPITAL_COMMUNITY)
Admission: EM | Admit: 2015-02-14 | Discharge: 2015-02-14 | Disposition: A | Discharge status: Home / Self Care | Payer: Medicaid Other | Attending: Emergency Medicine | Admitting: Emergency Medicine

## 2015-02-14 ENCOUNTER — Emergency Department (HOSPITAL_COMMUNITY): Payer: Medicaid Other

## 2015-02-14 ENCOUNTER — Encounter (HOSPITAL_COMMUNITY): Payer: Self-pay | Admitting: *Deleted

## 2015-02-14 DIAGNOSIS — Z8619 Personal history of other infectious and parasitic diseases: Secondary | ICD-10-CM | POA: Diagnosis not present

## 2015-02-14 DIAGNOSIS — Z79899 Other long term (current) drug therapy: Secondary | ICD-10-CM | POA: Diagnosis not present

## 2015-02-14 DIAGNOSIS — J45901 Unspecified asthma with (acute) exacerbation: Secondary | ICD-10-CM | POA: Diagnosis not present

## 2015-02-14 DIAGNOSIS — Z87891 Personal history of nicotine dependence: Secondary | ICD-10-CM | POA: Insufficient documentation

## 2015-02-14 DIAGNOSIS — J45909 Unspecified asthma, uncomplicated: Secondary | ICD-10-CM | POA: Diagnosis present

## 2015-02-14 DIAGNOSIS — Z872 Personal history of diseases of the skin and subcutaneous tissue: Secondary | ICD-10-CM | POA: Diagnosis not present

## 2015-02-14 MED ORDER — ALBUTEROL (5 MG/ML) CONTINUOUS INHALATION SOLN
10.0000 mg/h | INHALATION_SOLUTION | RESPIRATORY_TRACT | Status: DC
  Administered 2015-02-14: 10 mg/h via RESPIRATORY_TRACT
  Filled 2015-02-14: qty 20

## 2015-02-14 MED ORDER — PREDNISONE 20 MG PO TABS
60.0000 mg | ORAL_TABLET | Freq: Once | ORAL | Status: AC
Start: 2015-02-14 — End: 2015-02-14
  Administered 2015-02-14: 60 mg via ORAL
  Filled 2015-02-14: qty 3

## 2015-02-14 MED ORDER — ALBUTEROL SULFATE HFA 108 (90 BASE) MCG/ACT IN AERS
2.0000 | INHALATION_SPRAY | RESPIRATORY_TRACT | Status: DC
  Administered 2015-02-14: 2 via RESPIRATORY_TRACT
  Filled 2015-02-14: qty 6.7

## 2015-02-14 MED ORDER — ALBUTEROL (5 MG/ML) CONTINUOUS INHALATION SOLN
10.0000 mg/h | INHALATION_SOLUTION | Freq: Once | RESPIRATORY_TRACT | Status: AC
  Administered 2015-02-14: 10 mg/h via RESPIRATORY_TRACT
  Filled 2015-02-14: qty 20

## 2015-02-14 MED ORDER — PREDNISONE 10 MG (21) PO TBPK: 10.0000 mg | ORAL_TABLET | Freq: Every day | ORAL | Status: DC

## 2015-02-14 NOTE — Discharge Instructions (Signed)
Asthma, Acute Bronchospasm Follow-up with her primary care provider. Return for difficulty breathing. Acute bronchospasm caused by asthma is also referred to as an asthma attack. Bronchospasm means your air passages become narrowed. The narrowing is caused by inflammation and tightening of the muscles in the air tubes (bronchi) in your lungs. This can make it hard to breathe or cause you to wheeze and cough. CAUSES Possible triggers are:  Animal dander from the skin, hair, or feathers of animals.  Dust mites contained in house dust.  Cockroaches.  Pollen from trees or grass.  Mold.  Cigarette or tobacco smoke.  Air pollutants such as dust, household cleaners, hair sprays, aerosol sprays, paint fumes, strong chemicals, or strong odors.  Cold air or weather changes. Cold air may trigger inflammation. Winds increase molds and pollens in the air.  Strong emotions such as crying or laughing hard.  Stress.  Certain medicines such as aspirin or beta-blockers.  Sulfites in foods and drinks, such as dried fruits and wine.  Infections or inflammatory conditions, such as a flu, cold, or inflammation of the nasal membranes (rhinitis).  Gastroesophageal reflux disease (GERD). GERD is a condition where stomach acid backs up into your esophagus.  Exercise or strenuous activity. SIGNS AND SYMPTOMS   Wheezing.  Excessive coughing, particularly at night.  Chest tightness.  Shortness of breath. DIAGNOSIS  Your health care provider will ask you about your medical history and perform a physical exam. A chest X-ray or blood testing may be performed to look for other causes of your symptoms or other conditions that may have triggered your asthma attack. TREATMENT  Treatment is aimed at reducing inflammation and opening up the airways in your lungs. Most asthma attacks are treated with inhaled medicines. These include quick relief or rescue medicines (such as bronchodilators) and controller  medicines (such as inhaled corticosteroids). These medicines are sometimes given through an inhaler or a nebulizer. Systemic steroid medicine taken by mouth or given through an IV tube also can be used to reduce the inflammation when an attack is moderate or severe. Antibiotic medicines are only used if a bacterial infection is present.  HOME CARE INSTRUCTIONS   Rest.  Drink plenty of liquids. This helps the mucus to remain thin and be easily coughed up. Only use caffeine in moderation and do not use alcohol until you have recovered from your illness.  Do not smoke. Avoid being exposed to secondhand smoke.  You play a critical role in keeping yourself in good health. Avoid exposure to things that cause you to wheeze or to have breathing problems.  Keep your medicines up-to-date and available. Carefully follow your health care provider's treatment plan.  Take your medicine exactly as prescribed.  When pollen or pollution is bad, keep windows closed and use an air conditioner or go to places with air conditioning.  Asthma requires careful medical care. See your health care provider for a follow-up as advised. If you are more than [redacted] weeks pregnant and you were prescribed any new medicines, let your obstetrician know about the visit and how you are doing. Follow up with your health care provider as directed.  After you have recovered from your asthma attack, make an appointment with your outpatient doctor to talk about ways to reduce the likelihood of future attacks. If you do not have a doctor who manages your asthma, make an appointment with a primary care doctor to discuss your asthma. SEEK IMMEDIATE MEDICAL CARE IF:   You are getting worse.  You have trouble breathing. If severe, call your local emergency services (911 in the [place]  You develop chest pain or discomfort.  You are vomiting.  You are not able to keep fluids down.  You are coughing up yellow, green, brown, or bloody  sputum.  You have a fever and your symptoms suddenly get worse.  You have trouble swallowing. MAKE SURE YOU:   Understand these instructions.  Will watch your condition.  Will get help right away if you are not doing well or get worse.   This information is not intended to replace advice given to you by your health care provider. Make sure you discuss any questions you have with your health care provider.   Document Released: 07/20/2006 Document Revised: 04/09/2013 Document Reviewed: 10/10/2012 [employer] Interactive Patient Education [employer]

## 2015-02-14 NOTE — ED Provider Notes (Signed)
Patient received in signout from PA Patel at shift change. Briefly, 24 year old female here with likely asthma exacerbation. Chest x-ray is negative. Vital signs stable.  Patient given steroid and neb thus far, mild improvement.  Plan:  Patient to be started on continous neb.  Will reassess after.  If lungs cleared, d/c home with steroid taper and PCP FU.   Dg Chest 2 View  02/14/2015  CLINICAL DATA:  Shortness of breath for 1 week. EXAM: CHEST  2 VIEW COMPARISON:  07/08/2014 FINDINGS: Cardiomediastinal silhouette is normal. Mediastinal contours appear intact. There is no evidence of focal airspace consolidation, pleural effusion or pneumothorax. Osseous structures are without acute abnormality. Soft tissues are grossly normal. IMPRESSION: No active cardiopulmonary disease. Electronically Signed   By: Ted Mcalpineobrinka  Dimitrova M.D.   On: 02/14/2015 14:46    3:55 PM  Continuous neb in process.  Patient states she is feeling better thus far.  Will re-check when finished.  VS remain stable at this time.  4:52 PM After CAT finished patient states she is feeling much better.  Lung sounds clear, no distress, speaking in full sentences without difficulty.  Slight tachyardia noted which is likely from albuterol.  Patient appears stable for discharge. Patient was given additional albuterol inhaler. Emergency department. She will be discharged home with prednisone taper. She is to follow-up with her PCP.  Discussed plan with patient, he/she acknowledged understanding and agreed with plan of care.  Return precautions given for new or worsening symptoms.  Garlon HatchetLisa M Jeraline Marcinek, PA-C 02/14/15 1808  Rolland PorterMark James, MD 03/02/15 [phone]

## 2015-02-14 NOTE — ED Provider Notes (Signed)
CSN: [id number]     Arrival date & time 02/14/15  1230 History   First MD Initiated Contact with Patient 02/14/15 1243     Chief Complaint  Patient presents with  . Asthma     (Consider location/radiation/quality/duration/timing/severity/associated sxs/prior Treatment) Patient is a 24 y.o. female presenting with asthma. The history is provided by the patient. No language interpreter was used.  Asthma Associated symptoms include coughing. Pertinent negatives include no chills or fever.  Gail Walker is a 24 year old female with a history of asthma, and GERD who presents for worsening shortness of breath over the past week. She also reports a nonproductive cough. She states she has had to use her albuterol rescue inhaler every 10 minutes for the past couple of days. She states she has been hospitalized in the past for asthma exacerbations and her last admission was 5 months ago.  She denies any fever, chills, chest pain, abdominal pain, nausea, vomiting.  Past Medical History  Diagnosis Date  . Chlamydia 2012  . Acne   . Asthma     uses albuterol inhaler daily  . GERD (gastroesophageal reflux disease)     during pregnancy - takes zantac   Past Surgical History  Procedure Laterality Date  . Pregnancy termination    . No past surgeries     Family History  Problem Relation Age of Onset  . Anesthesia problems Neg Hx   . Hearing loss Neg Hx   . Cancer Maternal Aunt    Social History  Substance Use Topics  . Smoking status: Former Smoker -- 0.25 packs/day    Quit date: 03/03/2011  . Smokeless tobacco: Never Used  . Alcohol Use: No   OB History    Gravida Para Term Preterm AB TAB SAB Ectopic Multiple Living   0 1 1 0 0 0 1     Review of Systems  Constitutional: Negative for fever and chills.  Respiratory: Positive for cough and shortness of breath.   All other systems reviewed and are negative.     Allergies  Review of patient's allergies indicates no known  allergies.  Home Medications   Prior to Admission medications   Medication Sig Start Date End Date Taking? Authorizing Provider  albuterol (PROVENTIL HFA;VENTOLIN HFA) 108 (90 BASE) MCG/ACT inhaler Inhale 2 puffs into the lungs every 2 (two) hours as needed for wheezing or shortness of breath (cough). Patient taking differently: Inhale 2 puffs into the lungs every hour as needed for wheezing or shortness of breath (cough).  05/03/14  Yes Mercedes Camprubi-Soms, PA-C  cetirizine (ZYRTEC) 10 MG tablet TAKE 1 TABLET BY MOUTH DAILY. Patient not taking: Reported on 11/24/2014 01/10/14   Tobey Grim, MD  Chlorhexidine Gluconate 2 % SOLN Apply 1 application topically once a week. Patient not taking: Reported on 02/14/2015 11/24/14   Kristen N Ward, DO  predniSONE (STERAPRED UNI-PAK 21 TAB) 10 MG (21) TBPK tablet Take 1 tablet (10 mg total) by mouth daily. Take 5 tabs for 2 days, then 4 tabs for 2 days, then 3 tabs for 2 days, 2 tabs for 2 days, then 1 tab by mouth daily for 2 days 02/14/15   Catha Gosselin, PA-C  triamcinolone ointment (KENALOG) 0.1 % APPLY TO ECZEMA TOPICALLY TWICE DAILY AS NEEDED Patient not taking: Reported on 11/24/2014 05/16/14   Tobey Grim, MD   BP 118/81 mmHg  Pulse 94  Temp(Src) 98.1 F (36.7 C) (Oral)  Resp 18  SpO2 100%  LMP  01/31/2015 (Approximate) Physical Exam  Constitutional: She is oriented to person, place, and time. She appears well-developed and well-nourished.  Speaking full sentences.  HENT:  Head: Normocephalic and atraumatic.  Eyes: Conjunctivae are normal.  Neck: Normal range of motion. Neck supple.  Cardiovascular: Normal rate, regular rhythm and normal heart sounds.   Pulmonary/Chest: Effort normal. No respiratory distress. She has no rales.  Bilateral wheezing throughout. No respiratory distress or use of accessory muscles. No drooling. She is well-appearing and sitting comfortably.  Abdominal: Soft. There is no tenderness.  Musculoskeletal:  Normal range of motion.  Neurological: She is alert and oriented to person, place, and time.  Skin: Skin is warm and dry.  Nursing note and vitals reviewed.   ED Course  Procedures (including critical care time) Labs Review Labs Reviewed - No data to display  Imaging Review Dg Chest 2 View  02/14/2015  CLINICAL DATA:  Shortness of breath for 1 week. EXAM: CHEST  2 VIEW COMPARISON:  07/08/2014 FINDINGS: Cardiomediastinal silhouette is normal. Mediastinal contours appear intact. There is no evidence of focal airspace consolidation, pleural effusion or pneumothorax. Osseous structures are without acute abnormality. Soft tissues are grossly normal. IMPRESSION: No active cardiopulmonary disease. Electronically Signed   By: Ted Mcalpineobrinka  Dimitrova M.D.   On: 02/14/2015 14:46   I have personally reviewed and evaluated these image results as part of my medical decision-making.   EKG Interpretation None      MDM   Final diagnoses:  Asthma exacerbation  Patient with asthma presents for sob and cough x 1 week. Her vital signs are stable and she is afebrile. This is most likely an asthma exacerbation since her x-ray is negative for pneumonia, pneumothorax, or edema. She has bilateral wheezing throughout. Medications  albuterol (PROVENTIL,VENTOLIN) solution continuous neb (10 mg/hr Nebulization Given 02/14/15 1516)  predniSONE (DELTASONE) tablet 60 mg (60 mg Oral Given 02/14/15 1513)   She was given an additional albuterol inhaler, prednisone, and nebulizer treatments in the ED. She remained stable. I discussed that I would be giving her prednisone to go home with. I explained return precautions to the patient as well as follow-up and she verbally agrees with the plan.    Catha GosselinHanna Patel-Mills, PA-C 02/15/15 1455  Elwin MochaBlair Walden, MD 02/16/15 1531

## 2015-02-14 NOTE — ED Notes (Addendum)
Pt reports hx of asthma, says asthma has been acting up for last week, cough, nasal discharge clear phlegm x2 days. Pt filled albuterol inhaler yesterday 10/28, comes with 200 puffs, inhaler is now down to 60 puffs. States she is using inhaler every 10 minutes. Pt know she has been using her inhaler to much. Denies pain. expitory wheezing heard throughout lung fields.

## 2015-03-02 ENCOUNTER — Other Ambulatory Visit: Payer: Self-pay | Admitting: Family Medicine

## 2015-03-13 ENCOUNTER — Other Ambulatory Visit: Payer: Self-pay | Admitting: Family Medicine

## 2015-03-18 ENCOUNTER — Emergency Department (HOSPITAL_COMMUNITY)
Admission: EM | Admit: 2015-03-18 | Discharge: 2015-03-18 | Disposition: A | Discharge status: Home / Self Care | Payer: Medicaid Other | Attending: Emergency Medicine | Admitting: Emergency Medicine

## 2015-03-18 ENCOUNTER — Encounter (HOSPITAL_COMMUNITY): Payer: Self-pay | Admitting: Family Medicine

## 2015-03-18 DIAGNOSIS — J45901 Unspecified asthma with (acute) exacerbation: Secondary | ICD-10-CM | POA: Insufficient documentation

## 2015-03-18 DIAGNOSIS — Z87891 Personal history of nicotine dependence: Secondary | ICD-10-CM | POA: Diagnosis not present

## 2015-03-18 DIAGNOSIS — Z79899 Other long term (current) drug therapy: Secondary | ICD-10-CM | POA: Diagnosis not present

## 2015-03-18 DIAGNOSIS — Z8619 Personal history of other infectious and parasitic diseases: Secondary | ICD-10-CM | POA: Diagnosis not present

## 2015-03-18 DIAGNOSIS — Z8719 Personal history of other diseases of the digestive system: Secondary | ICD-10-CM | POA: Insufficient documentation

## 2015-03-18 DIAGNOSIS — Z872 Personal history of diseases of the skin and subcutaneous tissue: Secondary | ICD-10-CM | POA: Diagnosis not present

## 2015-03-18 DIAGNOSIS — J45909 Unspecified asthma, uncomplicated: Secondary | ICD-10-CM | POA: Diagnosis present

## 2015-03-18 MED ORDER — ALBUTEROL SULFATE HFA 108 (90 BASE) MCG/ACT IN AERS
2.0000 | INHALATION_SPRAY | RESPIRATORY_TRACT | Status: DC
  Administered 2015-03-18: 2 via RESPIRATORY_TRACT
  Filled 2015-03-18: qty 6.7

## 2015-03-18 MED ORDER — PREDNISONE 20 MG PO TABS
40.0000 mg | ORAL_TABLET | Freq: Once | ORAL | Status: AC
  Administered 2015-03-18: 40 mg via ORAL
  Filled 2015-03-18: qty 2

## 2015-03-18 MED ORDER — PREDNISONE 10 MG (21) PO TBPK: 40.0000 mg | ORAL_TABLET | Freq: Every day | ORAL | Status: DC

## 2015-03-18 MED ORDER — ALBUTEROL SULFATE (2.5 MG/3ML) 0.083% IN NEBU
5.0000 mg | INHALATION_SOLUTION | Freq: Once | RESPIRATORY_TRACT | Status: AC
  Administered 2015-03-18: 5 mg via RESPIRATORY_TRACT
  Filled 2015-03-18: qty 6

## 2015-03-18 NOTE — ED Provider Notes (Signed)
CSN: [id number]     Arrival date & time 03/18/15  0630 History   First MD Initiated Contact with Patient 03/18/15 [phone]     Chief Complaint  Patient presents with  . Asthma    (Consider location/radiation/quality/duration/timing/severity/associated sxs/prior Treatment) Patient is a 24 y.o. female presenting with asthma. The history is provided by the patient. No language interpreter was used.  Asthma Associated symptoms include coughing. Pertinent negatives include no fever.   Miss Gail Walker is a 24 year old female with a history of asthma who presents for an asthma attack. She states she began to get short of breath yesterday with wheezing. She denies using an albuterol inhaler and states that she does not have one. She reports that she was hospitalized once in the past for an asthma exacerbation. She also has a clear sputum cough. She denies any fever, chills, sore throat.   Past Medical History  Diagnosis Date  . Chlamydia 2012  . Acne   . Asthma     uses albuterol inhaler daily  . GERD (gastroesophageal reflux disease)     during pregnancy - takes zantac   Past Surgical History  Procedure Laterality Date  . Pregnancy termination    . No past surgeries     Family History  Problem Relation Age of Onset  . Anesthesia problems Neg Hx   . Hearing loss Neg Hx   . Cancer Maternal Aunt    Social History  Substance Use Topics  . Smoking status: Former Smoker -- 0.25 packs/day    Quit date: 03/03/2011  . Smokeless tobacco: Never Used  . Alcohol Use: No   OB History    Gravida Para Term Preterm AB TAB SAB Ectopic Multiple Living   2 1 1  0 1 1 0 0 0 1     Review of Systems  Constitutional: Negative for fever.  Respiratory: Positive for cough, shortness of breath and wheezing.   All other systems reviewed and are negative.     Allergies  Review of patient's allergies indicates no known allergies.  Home Medications   Prior to Admission medications   Medication Sig  Start Date End Date Taking? Authorizing Provider  PROAIR HFA 108 (90 BASE) MCG/ACT inhaler INHALE 2 PUFFS INTO THE LUNGS EVERY 6 HOURS AS NEEDED FOR SHORTNESS OF BREATH 03/16/15  Yes Tobey GrimJeffrey H Walden, MD  triamcinolone ointment (KENALOG) 0.1 % APPLY TO ECZEMA TOPICALLY TWICE DAILY AS NEEDED Patient taking differently: APPLY TO ECZEMA TOPICALLY TWICE DAILY AS NEEDED for itch 03/02/15  Yes Tobey GrimJeffrey H Walden, MD  cetirizine (ZYRTEC) 10 MG tablet TAKE 1 TABLET BY MOUTH DAILY. Patient not taking: Reported on 11/24/2014 01/10/14   Tobey GrimJeffrey H Walden, MD  Chlorhexidine Gluconate 2 % SOLN Apply 1 application topically once a week. Patient not taking: Reported on 02/14/2015 11/24/14   Kristen N Ward, DO  predniSONE (STERAPRED UNI-PAK 21 TAB) 10 MG (21) TBPK tablet Take 4 tablets (40 mg total) by mouth daily. 40 mg a day for 5 days 03/18/15   Gail Russell Patel-Mills, PA-C   BP 122/69 mmHg  Pulse 93  Temp(Src) 98.5 F (36.9 C) (Oral)  Resp 16  Ht 5\' 4"  (1.626 m)  Wt 86.183 kg  BMI 32.60 kg/m2  SpO2 99%  LMP  Physical Exam  Constitutional: She is oriented to person, place, and time. She appears well-developed and well-nourished.  HENT:  Head: Normocephalic and atraumatic.  Eyes: Conjunctivae are normal.  Neck: Normal range of motion. Neck supple.  Cardiovascular: Normal rate, regular  rhythm and normal heart sounds.   Pulmonary/Chest: Effort normal. No respiratory distress. She has wheezes. She has no rales. She exhibits no tenderness.  She is receiving an albuterol neb solution on my exam.  She has bilateral wheezing throughout. No decreased breath sounds.  Abdominal: Soft. There is no tenderness.  Musculoskeletal: Normal range of motion.  Neurological: She is alert and oriented to person, place, and time.  Skin: Skin is warm and dry.  Nursing note and vitals reviewed.   ED Course  Procedures (including critical care time) Labs Review Labs Reviewed - No data to display  Imaging Review No results  found.   EKG Interpretation None      MDM   Final diagnoses:  Asthma exacerbation   Patient presents for wheezing and shortness of breath. She is a known asthmatic. She was given a nebulizer treatment upon arrival. I do not suspect pneumothorax or pneumonia. She is afebrile. Oxygen is 98% on room air. Recheck: She states she feels much better after breathing treatment. Rx: prednisone. I discussed return precautions with the patient as well as follow-up. She verbally agrees with the plan. Filed Vitals:   03/18/15 0633 03/18/15 0757  BP: 108/79 122/69  Pulse: 109 93  Temp: 98.8 F (37.1 C) 98.5 F (36.9 C)  Resp: 22 16   Medications  albuterol (PROVENTIL HFA;VENTOLIN HFA) 108 (90 BASE) MCG/ACT inhaler 2 puff (2 puffs Inhalation Given 03/18/15 0705)  albuterol (PROVENTIL) (2.5 MG/3ML) 0.083% nebulizer solution 5 mg (5 mg Nebulization Given 03/18/15 0647)  predniSONE (DELTASONE) tablet 40 mg (40 mg Oral Given 03/18/15 0703)       Catha Gosselin, PA-C 03/18/15 4098  Tilden Fossa, MD 03/18/15 1635

## 2015-03-18 NOTE — Discharge Instructions (Signed)
Asthma, Adult Follow-up with her primary care physician. Return for shortness of breath or fever. Asthma is a condition of the lungs in which the airways tighten and narrow. Asthma can make it hard to breathe. Asthma cannot be cured, but medicine and lifestyle changes can help control it. Asthma may be started (triggered) by:  Animal skin flakes (dander).  Dust.  Cockroaches.  Pollen.  Mold.  Smoke.  Cleaning products.  Hair sprays or aerosol sprays.  Paint fumes or strong smells.  Cold air, weather changes, and winds.  Crying or laughing hard.  Stress.  Certain medicines or drugs.  Foods, such as dried fruit, potato chips, and sparkling grape juice.  Infections or conditions (colds, flu).  Exercise.  Certain medical conditions or diseases.  Exercise or tiring activities. HOME CARE   Take medicine as told by your doctor.  Use a peak flow meter as told by your doctor. A peak flow meter is a tool that measures how well the lungs are working.  Record and keep track of the peak flow meter's readings.  Understand and use the asthma action plan. An asthma action plan is a written plan for taking care of your asthma and treating your attacks.  To help prevent asthma attacks:  Do not smoke. Stay away from secondhand smoke.  Change your heating and air conditioning filter often.  Limit your use of fireplaces and wood stoves.  Get rid of pests (such as roaches and mice) and their droppings.  Throw away plants if you see mold on them.  Clean your floors. Dust regularly. Use cleaning products that do not smell.  Have someone vacuum when you are not home. Use a vacuum cleaner with a HEPA filter if possible.  Replace carpet with wood, tile, or vinyl flooring. Carpet can trap animal skin flakes and dust.  Use allergy-proof pillows, mattress covers, and box spring covers.  Wash bed sheets and blankets every week in hot water and dry them in a dryer.  Use  blankets that are made of polyester or cotton.  Clean bathrooms and kitchens with bleach. If possible, have someone repaint the walls in these rooms with mold-resistant paint. Keep out of the rooms that are being cleaned and painted.  Wash hands often. GET HELP IF:  You have make a whistling sound when breaking (wheeze), have shortness of breath, or have a cough even if taking medicine to prevent attacks.  The colored mucus you cough up (sputum) is thicker than usual.  The colored mucus you cough up changes from clear or white to yellow, green, gray, or bloody.  You have problems from the medicine you are taking such as:  A rash.  Itching.  Swelling.  Trouble breathing.  You need reliever medicines more than 2-3 times a week.  Your peak flow measurement is still at 50-79% of your personal best after following the action plan for 1 hour.  You have a fever. GET HELP RIGHT AWAY IF:   You seem to be worse and are not responding to medicine during an asthma attack.  You are short of breath even at rest.  You get short of breath when doing very little activity.  You have trouble eating, drinking, or talking.  You have chest pain.  You have a fast heartbeat.  Your lips or fingernails start to turn blue.  You are light-headed, dizzy, or faint.  Your peak flow is less than 50% of your personal best.   This information is not intended  to replace advice given to you by your health care provider. Make sure you discuss any questions you have with your health care provider.   Document Released: 09/21/2007 Document Revised: 12/24/2014 Document Reviewed: 11/01/2012 Elsevier Interactive Patient Education [employer]

## 2015-03-18 NOTE — ED Notes (Signed)
Pt given inhaler and instructed on use; pt provided return demonstration on use

## 2015-03-18 NOTE — ED Notes (Signed)
Pt reports she is experiencing an asthma attack. Audible wheezes heard without ausculation. Pt reports she has not had an inhaler to use for this attack.

## 2015-03-30 ENCOUNTER — Other Ambulatory Visit: Payer: Self-pay | Admitting: Family Medicine

## 2015-05-11 ENCOUNTER — Emergency Department (HOSPITAL_COMMUNITY): Payer: Medicaid Other

## 2015-05-11 ENCOUNTER — Encounter (HOSPITAL_COMMUNITY): Payer: Self-pay | Admitting: Emergency Medicine

## 2015-05-11 ENCOUNTER — Emergency Department (HOSPITAL_COMMUNITY)
Admission: EM | Admit: 2015-05-11 | Discharge: 2015-05-11 | Disposition: A | Discharge status: Home / Self Care | Payer: Medicaid Other | Attending: Emergency Medicine | Admitting: Emergency Medicine

## 2015-05-11 DIAGNOSIS — J45901 Unspecified asthma with (acute) exacerbation: Secondary | ICD-10-CM | POA: Insufficient documentation

## 2015-05-11 DIAGNOSIS — Z79899 Other long term (current) drug therapy: Secondary | ICD-10-CM | POA: Insufficient documentation

## 2015-05-11 DIAGNOSIS — Z872 Personal history of diseases of the skin and subcutaneous tissue: Secondary | ICD-10-CM | POA: Insufficient documentation

## 2015-05-11 DIAGNOSIS — Z8619 Personal history of other infectious and parasitic diseases: Secondary | ICD-10-CM | POA: Insufficient documentation

## 2015-05-11 DIAGNOSIS — Z8719 Personal history of other diseases of the digestive system: Secondary | ICD-10-CM | POA: Diagnosis not present

## 2015-05-11 DIAGNOSIS — Z87891 Personal history of nicotine dependence: Secondary | ICD-10-CM | POA: Insufficient documentation

## 2015-05-11 DIAGNOSIS — J45909 Unspecified asthma, uncomplicated: Secondary | ICD-10-CM | POA: Diagnosis present

## 2015-05-11 MED ORDER — IPRATROPIUM-ALBUTEROL 0.5-2.5 (3) MG/3ML IN SOLN
3.0000 mL | Freq: Once | RESPIRATORY_TRACT | Status: AC
  Administered 2015-05-11: 3 mL via RESPIRATORY_TRACT
  Filled 2015-05-11: qty 3

## 2015-05-11 MED ORDER — ALBUTEROL SULFATE HFA 108 (90 BASE) MCG/ACT IN AERS: 1.0000 | INHALATION_SPRAY | Freq: Four times a day (QID) | RESPIRATORY_TRACT | Status: DC | PRN

## 2015-05-11 MED ORDER — PREDNISONE 20 MG PO TABS
40.0000 mg | ORAL_TABLET | Freq: Once | ORAL | Status: AC
Start: 2015-05-11 — End: 2015-05-11
  Administered 2015-05-11: 40 mg via ORAL
  Filled 2015-05-11: qty 2

## 2015-05-11 MED ORDER — PREDNISONE 20 MG PO TABS: 60.0000 mg | ORAL_TABLET | Freq: Every day | ORAL | Status: DC

## 2015-05-11 NOTE — ED Provider Notes (Signed)
CSN: [id number]     Arrival date & time 05/11/15  1634 History  By signing my name below, I, Placido Sou, attest that this documentation has been prepared under the direction and in the presence of Melton Krebs PA-C. Electronically Signed: Placido Sou, ED Scribe. 05/11/2015. 6:01 PM.    Chief Complaint  Patient presents with  . Asthma   The history is provided by the patient. No language interpreter was used.    HPI Comments: Gail Walker is a 25 y.o. female who presents to the Emergency Department complaining of constant, mild, SOB with onset 2 weeks ago. Pt notes a hx of asthma and confirms her current symptoms are similar to prior asthma exacerbations. She notes using her prednisone inhaler BID which provided little relief of her symptoms. She is requesting a refill on her inhaler. She denies CP, abd pain, n/v, fever, chills or any other associated symptoms at this time.   Past Medical History  Diagnosis Date  . Chlamydia 2012  . Acne   . Asthma     uses albuterol inhaler daily  . GERD (gastroesophageal reflux disease)     during pregnancy - takes zantac   Past Surgical History  Procedure Laterality Date  . Pregnancy termination    . No past surgeries     Family History  Problem Relation Age of Onset  . Anesthesia problems Neg Hx   . Hearing loss Neg Hx   . Cancer Maternal Aunt    Social History  Substance Use Topics  . Smoking status: Former Smoker -- 0.25 packs/day    Quit date: 03/03/2011  . Smokeless tobacco: Never Used  . Alcohol Use: No   OB History    Gravida Para Term Preterm AB TAB SAB Ectopic Multiple Living   0 1 1 0 0 0 1     Review of Systems  Constitutional: Negative for fever and chills.  Respiratory: Positive for shortness of breath.   Cardiovascular: Negative for chest pain.  Gastrointestinal: Negative for nausea, vomiting and abdominal pain.  Ten systems are reviewed and are negative for acute change except as noted in  the HPI  Allergies  Review of patient's allergies indicates no known allergies.  Home Medications   Prior to Admission medications   Medication Sig Start Date End Date Taking? Authorizing Provider  cetirizine (ZYRTEC) 10 MG tablet TAKE 1 TABLET BY MOUTH DAILY. Patient not taking: Reported on 11/24/2014 01/10/14   Tobey Grim, MD  Chlorhexidine Gluconate 2 % SOLN Apply 1 application topically once a week. Patient not taking: Reported on 02/14/2015 11/24/14   Kristen N Ward, DO  predniSONE (STERAPRED UNI-PAK 21 TAB) 10 MG (21) TBPK tablet Take 4 tablets (40 mg total) by mouth daily. 40 mg a day for 5 days 03/18/15   Hanna Patel-Mills, PA-C  PROAIR HFA 108 (90 BASE) MCG/ACT inhaler INHALE 2 PUFFS INTO THE LUNGS EVERY 6 HOURS AS NEEDED FOR SHORTNESS OF BREATH 03/16/15   Tobey Grim, MD  triamcinolone ointment (KENALOG) 0.1 % APPLY TO ECZEMA TOPICALLY TWICE DAILY AS NEEDED Patient taking differently: APPLY TO ECZEMA TOPICALLY TWICE DAILY AS NEEDED for itch 03/02/15   Tobey Grim, MD   BP 122/64 mmHg  Pulse 95  Temp(Src) 98.4 F (36.9 C) (Oral)  Resp 23  SpO2 99%  LMP 04/01/2015    Physical Exam  Constitutional: She is oriented to person, place, and time. She appears well-developed and well-nourished. No distress.  HENT:  Head: Normocephalic and atraumatic.  Mouth/Throat: Oropharynx is clear and moist. No oropharyngeal exudate.  Airway intact  Eyes: Conjunctivae are normal. Pupils are equal, round, and reactive to light. Right eye exhibits no discharge. Left eye exhibits no discharge. No scleral icterus.  Neck: Normal range of motion. No tracheal deviation present.  Cardiovascular: Normal rate, regular rhythm, normal heart sounds and intact distal pulses.  Exam reveals no gallop and no friction rub.   No murmur heard. Pulmonary/Chest: Effort normal. No respiratory distress. She has wheezes. She has no rales. She exhibits no tenderness.  Inspiratory and expiratory wheezes  bilaterally.  Abdominal: Soft. Bowel sounds are normal. She exhibits no distension and no mass. There is no tenderness. There is no rebound and no guarding.  Musculoskeletal: Normal range of motion. She exhibits no edema.  Lymphadenopathy:    She has no cervical adenopathy.  Neurological: She is alert and oriented to person, place, and time. Coordination normal.  Skin: Skin is warm and dry. No rash noted. She is not diaphoretic. No erythema.  Psychiatric: She has a normal mood and affect. Her behavior is normal.  Nursing note and vitals reviewed.   ED Course  Procedures  DIAGNOSTIC STUDIES: Oxygen Saturation is 99% on RA, normal by my interpretation.    COORDINATION OF CARE: 6:00 PM Pt presents today due to SOB. Discussed treatment plan with pt at bedside. Pt agreed to plan.  Imaging Review Dg Chest 2 View  05/11/2015  CLINICAL DATA:  Shortness of breath.  Cough.  Asthma.  Smoker. EXAM: CHEST  2 VIEW COMPARISON:  02/14/2015 FINDINGS: Midline trachea.  Normal heart size and mediastinal contours. Sharp costophrenic angles.  No pneumothorax.  Clear lungs. IMPRESSION: No active cardiopulmonary disease. Electronically Signed   By: Jeronimo Greaves M.D.   On: 05/11/2015 17:26   I have personally reviewed and evaluated these images as part of my medical decision-making.  MDM   Final diagnoses:  Asthma, unspecified asthma severity, with acute exacerbation   Patient non-toxic appearing and VSS. Most likely asthma exacerbation. Less likely PE, ACS, PNA.  Xray ordered prior to my evaluation was unremarkable.  Patient received the following medications in the ED: Medications  ipratropium-albuterol (DUONEB) 0.5-2.5 (3) MG/3ML nebulizer solution 3 mL (3 mLs Nebulization Given 05/11/15 1831)  predniSONE (DELTASONE) tablet 40 mg (40 mg Oral Given 05/11/15 1831)   Patient feels improved after observation and/or treatment in ED. Wheezing resolved.  Patient may be safely discharged home  with: Discharge Medication List as of 05/11/2015  7:04 PM    START taking these medications   Details  !! albuterol (PROVENTIL HFA;VENTOLIN HFA) 108 (90 Base) MCG/ACT inhaler Inhale 1-2 puffs into the lungs every 6 (six) hours as needed for wheezing or shortness of breath., Starting 05/11/2015, Until Discontinued, Print    predniSONE (DELTASONE) 20 MG tablet Take 3 tablets (60 mg total) by mouth daily., Starting 05/11/2015, Until Discontinued, Print   Discussed reasons for return. Patient to follow-up with primary care provider within one week. Patient in understanding and agreement with the plan.  I personally performed the services described in this documentation, which was scribed in my presence. The recorded information has been reviewed and is accurate.   Melton Krebs, PA-C 05/13/15 2305  Lyndal Pulley, MD 05/14/15 [phone]

## 2015-05-11 NOTE — ED Notes (Signed)
Patient transported to X-ray 

## 2015-05-11 NOTE — ED Notes (Signed)
Pt c/o asthma exacerbation and SOB. Pt sts that she used up her inhaler in 4 to 5 days with no relief. Pt denies chest pain. Denies N/V, abdominal pain, fever. A&Ox4 and ambulatory.

## 2015-05-11 NOTE — ED Notes (Signed)
Pt given crackers and cheese. 

## 2015-05-11 NOTE — Discharge Instructions (Signed)
[name],  Nice meeting you! Please follow-up with your primary care provider. Return to the emergency department if you develop chest pain, increased shortness of breath. Feel better soon!  S. Lane Hacker, PA-C   Asthma, Adult Asthma is a recurring condition in which the airways tighten and narrow. Asthma can make it difficult to breathe. It can cause coughing, wheezing, and shortness of breath. Asthma episodes, also called asthma attacks, range from minor to life-threatening. Asthma cannot be cured, but medicines and lifestyle changes can help control it. CAUSES Asthma is believed to be caused by inherited (genetic) and environmental factors, but its exact cause is unknown. Asthma may be triggered by allergens, lung infections, or irritants in the air. Asthma triggers are different for each person. Common triggers include:   Animal dander.  Dust mites.  Cockroaches.  Pollen from trees or grass.  Mold.  Smoke.  Air pollutants such as dust, household cleaners, hair sprays, aerosol sprays, paint fumes, strong chemicals, or strong odors.  Cold air, weather changes, and winds (which increase molds and pollens in the air).  Strong emotional expressions such as crying or laughing hard.  Stress.  Certain medicines (such as aspirin) or types of drugs (such as beta-blockers).  Sulfites in foods and drinks. Foods and drinks that may contain sulfites include dried fruit, potato chips, and sparkling grape juice.  Infections or inflammatory conditions such as the flu, a cold, or an inflammation of the nasal membranes (rhinitis).  Gastroesophageal reflux disease (GERD).  Exercise or strenuous activity. SYMPTOMS Symptoms may occur immediately after asthma is triggered or many hours later. Symptoms include:  Wheezing.  Excessive nighttime or early morning coughing.  Frequent or severe coughing with a common cold.  Chest tightness.  Shortness of breath. DIAGNOSIS    The diagnosis of asthma is made by a review of your medical history and a physical exam. Tests may also be performed. These may include:  Lung function studies. These tests show how much air you breathe in and out.  Allergy tests.  Imaging tests such as X-rays. TREATMENT  Asthma cannot be cured, but it can usually be controlled. Treatment involves identifying and avoiding your asthma triggers. It also involves medicines. There are 2 classes of medicine used for asthma treatment:   Controller medicines. These prevent asthma symptoms from occurring. They are usually taken every day.  Reliever or rescue medicines. These quickly relieve asthma symptoms. They are used as needed and provide short-term relief. Your health care provider will help you create an asthma action plan. An asthma action plan is a written plan for managing and treating your asthma attacks. It includes a list of your asthma triggers and how they may be avoided. It also includes information on when medicines should be taken and when their dosage should be changed. An action plan may also involve the use of a device called a peak flow meter. A peak flow meter measures how well the lungs are working. It helps you monitor your condition. HOME CARE INSTRUCTIONS   Take medicines only as directed by your health care provider. Speak with your health care provider if you have questions about how or when to take the medicines.  Use a peak flow meter as directed by your health care provider. Record and keep track of readings.  Understand and use the action plan to help minimize or stop an asthma attack without needing to seek medical care.  Control your home environment in the following ways to help  prevent asthma attacks:  Do not smoke. Avoid being exposed to secondhand smoke.  Change your heating and air conditioning filter regularly.  Limit your use of fireplaces and wood stoves.  Get rid of pests (such as roaches and mice)  and their droppings.  Throw away plants if you see mold on them.  Clean your floors and dust regularly. Use unscented cleaning products.  Try to have someone else vacuum for you regularly. Stay out of rooms while they are being vacuumed and for a short while afterward. If you vacuum, use a dust mask from a hardware store, a double-layered or microfilter vacuum cleaner bag, or a vacuum cleaner with a HEPA filter.  Replace carpet with wood, tile, or vinyl flooring. Carpet can trap dander and dust.  Use allergy-proof pillows, mattress covers, and box spring covers.  Wash bed sheets and blankets every week in hot water and dry them in a dryer.  Use blankets that are made of polyester or cotton.  Clean bathrooms and kitchens with bleach. If possible, have someone repaint the walls in these rooms with mold-resistant paint. Keep out of the rooms that are being cleaned and painted.  Wash hands frequently. SEEK MEDICAL CARE IF:   You have wheezing, shortness of breath, or a cough even if taking medicine to prevent attacks.  The colored mucus you cough up (sputum) is thicker than usual.  Your sputum changes from clear or white to yellow, green, gray, or bloody.  You have any problems that may be related to the medicines you are taking (such as a rash, itching, swelling, or trouble breathing).  You are using a reliever medicine more than 2-3 times per week.  Your peak flow is still at 50-79% of your personal best after following your action plan for 1 hour.  You have a fever. SEEK IMMEDIATE MEDICAL CARE IF:   You seem to be getting worse and are unresponsive to treatment during an asthma attack.  You are short of breath even at rest.  You get short of breath when doing very little physical activity.  You have difficulty eating, drinking, or talking due to asthma symptoms.  You develop chest pain.  You develop a fast heartbeat.  You have a bluish color to your lips or  fingernails.  You are light-headed, dizzy, or faint.  Your peak flow is less than 50% of your personal best.   This information is not intended to replace advice given to you by your health care provider. Make sure you discuss any questions you have with your health care provider.   Document Released: 04/04/2005 Document Revised: 12/24/2014 Document Reviewed: 11/01/2012 Elsevier Interactive Patient Education [employer]

## 2015-06-01 ENCOUNTER — Other Ambulatory Visit: Payer: Self-pay | Admitting: Family Medicine

## 2015-07-01 ENCOUNTER — Other Ambulatory Visit: Payer: Self-pay | Admitting: Family Medicine

## 2015-07-29 ENCOUNTER — Other Ambulatory Visit: Payer: Self-pay | Admitting: Family Medicine

## 2015-08-03 ENCOUNTER — Encounter (HOSPITAL_COMMUNITY): Payer: Self-pay

## 2015-08-03 DIAGNOSIS — Z8619 Personal history of other infectious and parasitic diseases: Secondary | ICD-10-CM | POA: Insufficient documentation

## 2015-08-03 DIAGNOSIS — J45909 Unspecified asthma, uncomplicated: Secondary | ICD-10-CM | POA: Diagnosis present

## 2015-08-03 DIAGNOSIS — Z872 Personal history of diseases of the skin and subcutaneous tissue: Secondary | ICD-10-CM | POA: Diagnosis not present

## 2015-08-03 DIAGNOSIS — J45901 Unspecified asthma with (acute) exacerbation: Secondary | ICD-10-CM | POA: Insufficient documentation

## 2015-08-03 DIAGNOSIS — Z79899 Other long term (current) drug therapy: Secondary | ICD-10-CM | POA: Insufficient documentation

## 2015-08-03 DIAGNOSIS — Z7952 Long term (current) use of systemic steroids: Secondary | ICD-10-CM | POA: Diagnosis not present

## 2015-08-03 DIAGNOSIS — Z87891 Personal history of nicotine dependence: Secondary | ICD-10-CM | POA: Insufficient documentation

## 2015-08-03 DIAGNOSIS — Z8719 Personal history of other diseases of the digestive system: Secondary | ICD-10-CM | POA: Insufficient documentation

## 2015-08-03 NOTE — ED Notes (Signed)
Pt states recently received albuterol inhaler 3 days ago. Has used all breaths. Pt complaining of increasing symptoms at night.

## 2015-08-04 ENCOUNTER — Emergency Department (HOSPITAL_COMMUNITY): Payer: Medicaid Other

## 2015-08-04 ENCOUNTER — Emergency Department (HOSPITAL_COMMUNITY)
Admission: EM | Admit: 2015-08-04 | Discharge: 2015-08-04 | Disposition: A | Discharge status: Home / Self Care | Payer: Medicaid Other | Attending: Emergency Medicine | Admitting: Emergency Medicine

## 2015-08-04 DIAGNOSIS — J302 Other seasonal allergic rhinitis: Secondary | ICD-10-CM

## 2015-08-04 DIAGNOSIS — J45901 Unspecified asthma with (acute) exacerbation: Secondary | ICD-10-CM

## 2015-08-04 MED ORDER — PREDNISONE 10 MG PO TABS: ORAL_TABLET | ORAL | Status: DC

## 2015-08-04 MED ORDER — MONTELUKAST SODIUM 10 MG PO TABS: 10.0000 mg | ORAL_TABLET | Freq: Every day | ORAL | Status: DC

## 2015-08-04 MED ORDER — PREDNISONE 20 MG PO TABS
60.0000 mg | ORAL_TABLET | Freq: Once | ORAL | Status: AC
  Administered 2015-08-04: 60 mg via ORAL
  Filled 2015-08-04: qty 3

## 2015-08-04 MED ORDER — IPRATROPIUM-ALBUTEROL 0.5-2.5 (3) MG/3ML IN SOLN
3.0000 mL | Freq: Once | RESPIRATORY_TRACT | Status: AC
  Administered 2015-08-04: 3 mL via RESPIRATORY_TRACT
  Filled 2015-08-04: qty 3

## 2015-08-04 MED ORDER — ALBUTEROL SULFATE HFA 108 (90 BASE) MCG/ACT IN AERS
INHALATION_SPRAY | RESPIRATORY_TRACT | Status: AC
  Filled 2015-08-04: qty 6.7

## 2015-08-04 MED ORDER — ALBUTEROL SULFATE HFA 108 (90 BASE) MCG/ACT IN AERS
2.0000 | INHALATION_SPRAY | RESPIRATORY_TRACT | Status: DC | PRN
  Administered 2015-08-04: 2 via RESPIRATORY_TRACT

## 2015-08-04 NOTE — ED Provider Notes (Signed)
CSN: [id number]     Arrival date & time 08/03/15  2345 History   First MD Initiated Contact with Patient 08/04/15 0001     Chief Complaint  Patient presents with  . Asthma     (Consider location/radiation/quality/duration/timing/severity/associated sxs/prior Treatment) HPI Gail Walker is a 25 y.o. female with history of asthma, presents to emergency department with complaint of worsening shortness of breath. Patient states that she uses albuterol daily. She states the last 3 days she has had to use her inhaler every half an hour. She states she has went through a full inhaler. She is unsure how many polyps were an inhaler to start with but states it was more than 100. She has an appointment with primary care doctor and a week. She states that she just used the last hospital her inhaler and was worried that she would have an asthma attack in the middle of the night. She admits to coughing over last several weeks with productive clear sputum. She reports nasal congestion and bilateral ear pain. She states she has seasonal allergies but is not taking any medications because "they don't work." She has never been on any other asthma medications. She denies any chest pain. She states she has shortness of breath on exertion. She denies any other complaints.  Past Medical History  Diagnosis Date  . Chlamydia 2012  . Acne   . Asthma     uses albuterol inhaler daily  . GERD (gastroesophageal reflux disease)     during pregnancy - takes zantac   Past Surgical History  Procedure Laterality Date  . Pregnancy termination    . No past surgeries     Family History  Problem Relation Age of Onset  . Anesthesia problems Neg Hx   . Hearing loss Neg Hx   . Cancer Maternal Aunt    Social History  Substance Use Topics  . Smoking status: Former Smoker -- 0.25 packs/day    Quit date: 03/03/2011  . Smokeless tobacco: Never Used  . Alcohol Use: No   OB History    Gravida Para Term Preterm AB TAB  SAB Ectopic Multiple Living   0 1 1 0 0 0 1     Review of Systems  Constitutional: Negative for fever and chills.  Respiratory: Positive for cough, shortness of breath and wheezing. Negative for chest tightness.   Cardiovascular: Negative for chest pain, palpitations and leg swelling.  Gastrointestinal: Negative for nausea, vomiting, abdominal pain and diarrhea.  Genitourinary: Negative for dysuria, flank pain and pelvic pain.  Musculoskeletal: Negative for myalgias, arthralgias, neck pain and neck stiffness.  Skin: Negative for rash.  Neurological: Negative for dizziness, weakness and headaches.  All other systems reviewed and are negative.     Allergies  Review of patient's allergies indicates no known allergies.  Home Medications   Prior to Admission medications   Medication Sig Start Date End Date Taking? Authorizing Provider  albuterol (PROVENTIL HFA;VENTOLIN HFA) 108 (90 Base) MCG/ACT inhaler Inhale 1-2 puffs into the lungs every 6 (six) hours as needed for wheezing or shortness of breath. 05/11/15   Melton Krebs, PA-C  cetirizine (ZYRTEC) 10 MG tablet TAKE 1 TABLET BY MOUTH DAILY. Patient not taking: Reported on 11/24/2014 01/10/14   Tobey Grim, MD  Chlorhexidine Gluconate 2 % SOLN Apply 1 application topically once a week. Patient not taking: Reported on 02/14/2015 11/24/14   Kristen N Ward, DO  predniSONE (DELTASONE) 20 MG tablet Take 3 tablets (60  mg total) by mouth daily. 05/11/15   Melton KrebsSamantha Nicole Riley, PA-C  PROAIR HFA 108 [phone](90 Base) MCG/ACT inhaler INHALE 2 PUFFS INTO THE LUNGS EVERY 6 HOURS AS NEEDED FOR SHORTNESS OF BREATH 07/30/15   Uvaldo RisingKyle J Fletke, MD  triamcinolone ointment (KENALOG) 0.1 % APPLY TO ECZEMA TOPICALLY TWICE DAILY AS NEEDED Patient taking differently: APPLY TO ECZEMA TOPICALLY TWICE DAILY AS NEEDED for itch 03/02/15   Tobey GrimJeffrey H Walden, MD   BP 120/83 mmHg  Pulse 93  Temp(Src) 98 F (36.7 C) (Oral)  SpO2 95%  LMP  (LMP  Unknown) Physical Exam  Constitutional: She is oriented to person, place, and time. She appears well-developed and well-nourished. No distress.  HENT:  Head: Normocephalic.  Right Ear: Tympanic membrane, external ear and ear canal normal.  Left Ear: Tympanic membrane, external ear and ear canal normal.  Nose: Mucosal edema and rhinorrhea present.  Mouth/Throat: Uvula is midline, oropharynx is clear and moist and mucous membranes are normal.  Eyes: Conjunctivae are normal. Pupils are equal, round, and reactive to light.  Neck: Neck supple.  Cardiovascular: Normal rate, regular rhythm and normal heart sounds.   Pulmonary/Chest: Effort normal. No respiratory distress. She has wheezes. She has no rales.  Neurological: She is alert and oriented to person, place, and time.  Skin: Skin is warm and dry.  Psychiatric: She has a normal mood and affect. Her behavior is normal.  Nursing note and vitals reviewed.   ED Course  Procedures (including critical care time) Labs Review Labs Reviewed - No data to display  Imaging Review Dg Chest 2 View  08/04/2015  CLINICAL DATA:  Cough and shortness of breath. EXAM: CHEST  2 VIEW COMPARISON:  05/11/2015 FINDINGS: The cardiomediastinal contours are normal. Borderline hyperinflation and bronchial thickening. Pulmonary vasculature is normal. No consolidation, pleural effusion, or pneumothorax. No acute osseous abnormalities are seen. IMPRESSION: Borderline hyperinflation and bronchial thickening, may reflect bronchitis or asthma, mild in degree. Electronically Signed   By: Rubye OaksMelanie  Ehinger M.D.   On: 08/04/2015 00:48   I have personally reviewed and evaluated these images and lab results as part of my medical decision-making.   EKG Interpretation None      MDM   Final diagnoses:  Asthma exacerbation  Seasonal allergies    Pt with wheezing, cough, SOB. Pt went through entire inhaler in 3 days. Her asthma is not well controlled. Will get CXR.    Chest x-ray showing hyperinflation. Consistent with patient's asthma. Patient has an appointment in one week. Will start on Singulair for her allergies and asthma. Will provide another inhaler. Will also give prednisone taper. Will have her follow-up. I also provided her with pulmonology and allergy referral. Return precautions discussed.  Filed Vitals:   08/04/15 0000  BP: 120/83  Pulse: 93  Temp: 98 F (36.7 C)  TempSrc: Oral  SpO2: 95%     Jaynie Crumbleatyana Eulice Rutledge, PA-C 08/04/15 0102  Gerhard Munchobert Lockwood, MD 08/04/15 1606

## 2015-08-04 NOTE — Discharge Instructions (Signed)
Take flonase daily. Take singulair at bed time. Prednisone as prescribed until all gone. Inhaler 2 puffs every 4 hrs. Follow up with primary care doctor and pulmonology.   Asthma, Adult Asthma is a recurring condition in which the airways tighten and narrow. Asthma can make it difficult to breathe. It can cause coughing, wheezing, and shortness of breath. Asthma episodes, also called asthma attacks, range from minor to life-threatening. Asthma cannot be cured, but medicines and lifestyle changes can help control it. CAUSES Asthma is believed to be caused by inherited (genetic) and environmental factors, but its exact cause is unknown. Asthma may be triggered by allergens, lung infections, or irritants in the air. Asthma triggers are different for each person. Common triggers include:   Animal dander.  Dust mites.  Cockroaches.  Pollen from trees or grass.  Mold.  Smoke.  Air pollutants such as dust, household cleaners, hair sprays, aerosol sprays, paint fumes, strong chemicals, or strong odors.  Cold air, weather changes, and winds (which increase molds and pollens in the air).  Strong emotional expressions such as crying or laughing hard.  Stress.  Certain medicines (such as aspirin) or types of drugs (such as beta-blockers).  Sulfites in foods and drinks. Foods and drinks that may contain sulfites include dried fruit, potato chips, and sparkling grape juice.  Infections or inflammatory conditions such as the flu, a cold, or an inflammation of the nasal membranes (rhinitis).  Gastroesophageal reflux disease (GERD).  Exercise or strenuous activity. SYMPTOMS Symptoms may occur immediately after asthma is triggered or many hours later. Symptoms include:  Wheezing.  Excessive nighttime or early morning coughing.  Frequent or severe coughing with a common cold.  Chest tightness.  Shortness of breath. DIAGNOSIS  The diagnosis of asthma is made by a review of your medical  history and a physical exam. Tests may also be performed. These may include:  Lung function studies. These tests show how much air you breathe in and out.  Allergy tests.  Imaging tests such as X-rays. TREATMENT  Asthma cannot be cured, but it can usually be controlled. Treatment involves identifying and avoiding your asthma triggers. It also involves medicines. There are 2 classes of medicine used for asthma treatment:   Controller medicines. These prevent asthma symptoms from occurring. They are usually taken every day.  Reliever or rescue medicines. These quickly relieve asthma symptoms. They are used as needed and provide short-term relief. Your health care provider will help you create an asthma action plan. An asthma action plan is a written plan for managing and treating your asthma attacks. It includes a list of your asthma triggers and how they may be avoided. It also includes information on when medicines should be taken and when their dosage should be changed. An action plan may also involve the use of a device called a peak flow meter. A peak flow meter measures how well the lungs are working. It helps you monitor your condition. HOME CARE INSTRUCTIONS   Take medicines only as directed by your health care provider. Speak with your health care provider if you have questions about how or when to take the medicines.  Use a peak flow meter as directed by your health care provider. Record and keep track of readings.  Understand and use the action plan to help minimize or stop an asthma attack without needing to seek medical care.  Control your home environment in the following ways to help prevent asthma attacks:  Do not smoke. Avoid being  exposed to secondhand smoke.  Change your heating and air conditioning filter regularly.  Limit your use of fireplaces and wood stoves.  Get rid of pests (such as roaches and mice) and their droppings.  Throw away plants if you see mold on  them.  Clean your floors and dust regularly. Use unscented cleaning products.  Try to have someone else vacuum for you regularly. Stay out of rooms while they are being vacuumed and for a short while afterward. If you vacuum, use a dust mask from a hardware store, a double-layered or microfilter vacuum cleaner bag, or a vacuum cleaner with a HEPA filter.  Replace carpet with wood, tile, or vinyl flooring. Carpet can trap dander and dust.  Use allergy-proof pillows, mattress covers, and box spring covers.  Wash bed sheets and blankets every week in hot water and dry them in a dryer.  Use blankets that are made of polyester or cotton.  Clean bathrooms and kitchens with bleach. If possible, have someone repaint the walls in these rooms with mold-resistant paint. Keep out of the rooms that are being cleaned and painted.  Wash hands frequently. SEEK MEDICAL CARE IF:   You have wheezing, shortness of breath, or a cough even if taking medicine to prevent attacks.  The colored mucus you cough up (sputum) is thicker than usual.  Your sputum changes from clear or white to yellow, green, gray, or bloody.  You have any problems that may be related to the medicines you are taking (such as a rash, itching, swelling, or trouble breathing).  You are using a reliever medicine more than 2-3 times per week.  Your peak flow is still at 50-79% of your personal best after following your action plan for 1 hour.  You have a fever. SEEK IMMEDIATE MEDICAL CARE IF:   You seem to be getting worse and are unresponsive to treatment during an asthma attack.  You are short of breath even at rest.  You get short of breath when doing very little physical activity.  You have difficulty eating, drinking, or talking due to asthma symptoms.  You develop chest pain.  You develop a fast heartbeat.  You have a bluish color to your lips or fingernails.  You are light-headed, dizzy, or faint.  Your peak flow  is less than 50% of your personal best.   This information is not intended to replace advice given to you by your health care provider. Make sure you discuss any questions you have with your health care provider.   Document Released: 04/04/2005 Document Revised: 12/24/2014 Document Reviewed: 11/01/2012 Elsevier Interactive Patient Education 2016 [employer].   Allergic Rhinitis Allergic rhinitis is when the mucous membranes in the nose respond to allergens. Allergens are particles in the air that cause your body to have an allergic reaction. This causes you to release allergic antibodies. Through a chain of events, these eventually cause you to release histamine into the blood stream. Although meant to protect the body, it is this release of histamine that causes your discomfort, such as frequent sneezing, congestion, and an itchy, runny nose.  CAUSES Seasonal allergic rhinitis (hay fever) is caused by pollen allergens that may come from grasses, trees, and weeds. Year-round allergic rhinitis (perennial allergic rhinitis) is caused by allergens such as house dust mites, pet dander, and mold spores. SYMPTOMS  Nasal stuffiness (congestion).  Itchy, runny nose with sneezing and tearing of the eyes. DIAGNOSIS Your health care provider can help you determine the allergen or  allergens that trigger your symptoms. If you and your health care provider are unable to determine the allergen, skin or blood testing may be used. Your health care provider will diagnose your condition after taking your health history and performing a physical exam. Your health care provider may assess you for other related conditions, such as asthma, pink eye, or an ear infection. TREATMENT Allergic rhinitis does not have a cure, but it can be controlled by:  Medicines that block allergy symptoms. These may include allergy shots, nasal sprays, and oral antihistamines.  Avoiding the allergen. Hay fever may often be treated  with antihistamines in pill or nasal spray forms. Antihistamines block the effects of histamine. There are over-the-counter medicines that may help with nasal congestion and swelling around the eyes. Check with your health care provider before taking or giving this medicine. If avoiding the allergen or the medicine prescribed do not work, there are many new medicines your health care provider can prescribe. Stronger medicine may be used if initial measures are ineffective. Desensitizing injections can be used if medicine and avoidance does not work. Desensitization is when a patient is given ongoing shots until the body becomes less sensitive to the allergen. Make sure you follow up with your health care provider if problems continue. HOME CARE INSTRUCTIONS It is not possible to completely avoid allergens, but you can reduce your symptoms by taking steps to limit your exposure to them. It helps to know exactly what you are allergic to so that you can avoid your specific triggers. SEEK MEDICAL CARE IF:  You have a fever.  You develop a cough that does not stop easily (persistent).  You have shortness of breath.  You start wheezing.  Symptoms interfere with normal daily activities.   This information is not intended to replace advice given to you by your health care provider. Make sure you discuss any questions you have with your health care provider.   Document Released: 12/28/2000 Document Revised: 04/25/2014 Document Reviewed: 12/10/2012 Elsevier Interactive Patient Education [employer]

## 2015-08-07 ENCOUNTER — Ambulatory Visit (INDEPENDENT_AMBULATORY_CARE_PROVIDER_SITE_OTHER): Payer: Medicaid Other | Admitting: Family Medicine

## 2015-08-07 ENCOUNTER — Other Ambulatory Visit: Payer: Self-pay | Admitting: Family Medicine

## 2015-08-07 VITALS — BP 130/75 | HR 99 | Temp 98.2°F | Ht 64.0 in | Wt 233.5 lb

## 2015-08-07 DIAGNOSIS — J4550 Severe persistent asthma, uncomplicated: Secondary | ICD-10-CM

## 2015-08-07 MED ORDER — BUDESONIDE 180 MCG/ACT IN AEPB: INHALATION_SPRAY | RESPIRATORY_TRACT | Status: DC

## 2015-08-07 MED ORDER — ALBUTEROL SULFATE (2.5 MG/3ML) 0.083% IN NEBU
2.5000 mg | INHALATION_SOLUTION | Freq: Once | RESPIRATORY_TRACT | Status: AC
  Administered 2015-08-07: 2.5 mg via RESPIRATORY_TRACT

## 2015-08-07 MED ORDER — IPRATROPIUM-ALBUTEROL 18-103 MCG/ACT IN AERO: 2.0000 | INHALATION_SPRAY | Freq: Four times a day (QID) | RESPIRATORY_TRACT | Status: DC | PRN

## 2015-08-07 MED ORDER — DESONIDE 0.05 % EX CREA: TOPICAL_CREAM | Freq: Two times a day (BID) | CUTANEOUS | Status: DC

## 2015-08-07 MED ORDER — IPRATROPIUM-ALBUTEROL 20-100 MCG/ACT IN AERS: 1.0000 | INHALATION_SPRAY | Freq: Four times a day (QID) | RESPIRATORY_TRACT | Status: DC | PRN

## 2015-08-07 MED ORDER — FLUTICASONE PROPIONATE 50 MCG/ACT NA SUSP: 2.0000 | Freq: Every day | NASAL | Status: DC

## 2015-08-07 MED ORDER — IPRATROPIUM BROMIDE 0.02 % IN SOLN: 0.5000 mg | Freq: Once | RESPIRATORY_TRACT | Status: DC

## 2015-08-07 NOTE — Patient Instructions (Signed)
Call and let us know what your sister was using.  Use the Combivent as your rescue inhaler.  Use the Pulmicort daily.    I'm also sending in Flonase for you.

## 2015-08-07 NOTE — Addendum Note (Signed)
Addended by: Georges LynchSAUNDERS, Stepfon Rawles T on: 08/07/2015 05:27 PM   Modules accepted: Orders

## 2015-08-07 NOTE — Assessment & Plan Note (Signed)
Acute exacerbation.   Treated with atrovent/albuterol nebulized here.   Lung exam much better s/p treatment.   Re-prescribed Pulmicort.  Restart Combivent.   FU in 3-4 weeks to assess for improvement.

## 2015-08-07 NOTE — Progress Notes (Signed)
Subjective:    Gail Walker is a 25 y.o. female who [facility] for URI and asthma symptoms:  1.  Asthma:  Uses her albuterol ~30 puffs per day.  States she was taking prednisone (unclear where this came from).  Gave her another albuterol inhaler and steroids.  Still with symptoms.  Attacks are daily, including at night.  Out of her long-acting inhaler and has been so for at least a year.  Recent ED visit, received prednisone at that time.  Still taking it.  No fevers or chills.  Having allergy symptoms as well including runny/itching eyes.   ROS as above per HPI, otherwise neg.    The following portions of the patient's history were reviewed and updated as appropriate: allergies, current medications, past medical history, family and social history, and problem list. Patient is a nonsmoker.    PMH reviewed.  Past Medical History  Diagnosis Date  . Chlamydia 2012  . Acne   . Asthma     uses albuterol inhaler daily  . GERD (gastroesophageal reflux disease)     during pregnancy - takes zantac   Past Surgical History  Procedure Laterality Date  . Pregnancy termination    . No past surgeries      Medications reviewed. Current Outpatient Prescriptions  Medication Sig Dispense Refill  . albuterol (PROVENTIL HFA;VENTOLIN HFA) 108 (90 Base) MCG/ACT inhaler Inhale 1-2 puffs into the lungs every 6 (six) hours as needed for wheezing or shortness of breath. 1 Inhaler 0  . cetirizine (ZYRTEC) 10 MG tablet TAKE 1 TABLET BY MOUTH DAILY. (Patient not taking: Reported on 11/24/2014) 30 tablet 3  . Chlorhexidine Gluconate 2 % SOLN Apply 1 application topically once a week. (Patient not taking: Reported on 02/14/2015) 240 mL 0  . montelukast (SINGULAIR) 10 MG tablet Take 1 tablet (10 mg total) by mouth at bedtime. 30 tablet 0  . predniSONE (DELTASONE) 10 MG tablet Take 5 tab day 1, take 4 tab day 2, take 3 tab day 3, take 2 tab day 4, and take 1 tab day 5 15 tablet 0  . PROAIR HFA 108 (90  Base) MCG/ACT inhaler INHALE 2 PUFFS INTO THE LUNGS EVERY 6 HOURS AS NEEDED FOR SHORTNESS OF BREATH 8.5 Inhaler 1  . triamcinolone ointment (KENALOG) 0.1 % APPLY TO ECZEMA TOPICALLY TWICE DAILY AS NEEDED (Patient taking differently: APPLY TO ECZEMA TOPICALLY TWICE DAILY AS NEEDED for itch) 15 g 0   No current facility-administered medications for this visit.     Objective:   Physical Exam BP 130/75 mmHg  Pulse 99  Temp(Src) 98.2 F (36.8 C) (Oral)  Ht 5\' 4"  (1.626 m)  Wt 233 lb 8 oz (105.915 kg)  BMI 40.06 kg/m2  LMP 07/05/2015 Gen:  Patient sitting on exam table, appears stated age in no acute distress Head: Normocephalic atraumatic Eyes: EOMI, PERRL, sclera and conjunctiva non-erythematous Ears:  Canals clear bilaterally.  TMs pearly gray bilaterally without erythema or bulging.   Mouth: Mucosa membranes moist. Tonsils +2, nonenlarged, non-erythematous. Neck: No cervical lymphadenopathy noted Heart:  RRR, no murmurs auscultated. Pulm: Diffuse wheezing in all lung fields.  Decreased movement at bases.      No results found for this or any previous visit (from the past 72 hour(s)).

## 2015-08-07 NOTE — Progress Notes (Signed)
Subjective:     Patient ID: Harrel CarinaBrianna Newmark, female   DOB: 04/25/1990, 25 y.o.   MRN: [id number]  HPI  Mrs. Tereasa CoopBraxton is a 25 y.o. Female with a history of severe persistent asthma and allergies who presents with congestion and shortness of breath.   Asthma: For the past week, her asthma symptoms have not been under control. She used "30 puffs" of her inhaler each day during the first part of the week in addition to taking Proair and prednisone. She says these did not help so she went to the ED 2 days ago where she received albuterol, prednisone, and breathing treatment. She says she had moderate relief for a few hours following treatment but still struggles with her asthma. Last night she had several asthma attacks that caused her to wake up from sleep with difficulty breathing. She has a prescription for Singulair but has not started treatment yet. Her asthma is worse with allergies, weather changes, and illnesses. She smokes 2 cigarettes per day and is contemplative of quitting. 3 weeks ago she tried using her sisters inhaler and says this provided her with immediate and long lasting relief. She is wondering if she can be placed on the same medication. She denies fever, palpitations, chest pain, inflammation, painful/swollen legs, hemoptysis, or productive cough.    Seasonal allergies: Currently experiencing runny nose, congestion, and headaches. She says she takes Zyrtec for seasonal allergies. Currently SOB. Seasonal allergies exacerbate her asthma symptoms. Says she is going to take Montelukast for allergies and asthma.   Eczema: She would like a refill of her cream or a switch to stronger medication.   Review of Systems Negative other than as per HPI    Objective:   Physical Exam Filed Vitals:   08/07/15 1051  BP: 130/75  Pulse: 99  Temp: 98.2 F (36.8 C)  Gen: Appears fatigued, ill, awake, conversant and compliant. HEENT: Rhinorrhea. MMM. Pink conjunctivae.  Pulm: Diffuse, severe  wheezing bilaterally. Post nebulizer treatment, mild wheezing still audible on exam.   CV: RRR. No murmurs, rubs, or gallops.     Assessment:     Mrs. Tereasa CoopBraxton is a 25 y.o. Female with history of asthma and allergies who presents with congestion and shortness of breath concerning for asthma complicated by seasonal allergies given wheezing on exam, history of asthma and allergies, rhinorrhea, congestion, and relief with nebulizer treatment.     Plan:     Asthma: Recommend starting her on an anticholinergic and SABA, inhaled corticosteroid and Singulair. Next clinical visit see if asthma management has improved.   Seasonal allergies: Continue taking Zyrtec. See if symptoms improve after starting Singulair.   Eczema: Recommend Desonide 0.05% cream applied topically 2 times per day.

## 2015-08-10 ENCOUNTER — Telehealth: Payer: Self-pay | Admitting: *Deleted

## 2015-08-10 NOTE — Telephone Encounter (Signed)
Prior Authorization received from [facility] pharmacy for Pulmicort. Formulary and PA form placed in provider box for completion. Clovis PuMartin, Casanova Schurman L, RN

## 2015-08-11 NOTE — Telephone Encounter (Signed)
Rx changed to Pulmicort 0.5 mg.  Clovis PuMartin, Veron Senner L, RN

## 2015-08-21 ENCOUNTER — Telehealth: Payer: Self-pay | Admitting: Family Medicine

## 2015-08-21 NOTE — Telephone Encounter (Signed)
Pt was given RX for nebulizer solution but does not have a nebulizer. Will need to get one from our office.

## 2015-08-24 NOTE — Telephone Encounter (Signed)
Left message for patient that she could pick up a nebulizer machine from clinic nurse today.  Clovis PuMartin, Tamika L, RN

## 2015-08-30 ENCOUNTER — Other Ambulatory Visit: Payer: Self-pay | Admitting: Family Medicine

## 2015-10-09 ENCOUNTER — Other Ambulatory Visit: Payer: Self-pay | Admitting: Family Medicine

## 2015-11-03 ENCOUNTER — Inpatient Hospital Stay (HOSPITAL_COMMUNITY)
Admission: AD | Admit: 2015-11-03 | Discharge: 2015-11-03 | Disposition: A | Discharge status: Home / Self Care | Payer: Medicaid Other | Source: Ambulatory Visit | Attending: Obstetrics & Gynecology | Admitting: Obstetrics & Gynecology

## 2015-11-03 ENCOUNTER — Encounter (HOSPITAL_COMMUNITY): Payer: Self-pay | Admitting: *Deleted

## 2015-11-03 DIAGNOSIS — Z79899 Other long term (current) drug therapy: Secondary | ICD-10-CM | POA: Diagnosis not present

## 2015-11-03 DIAGNOSIS — K219 Gastro-esophageal reflux disease without esophagitis: Secondary | ICD-10-CM | POA: Insufficient documentation

## 2015-11-03 DIAGNOSIS — Z87891 Personal history of nicotine dependence: Secondary | ICD-10-CM | POA: Diagnosis not present

## 2015-11-03 DIAGNOSIS — N898 Other specified noninflammatory disorders of vagina: Secondary | ICD-10-CM | POA: Diagnosis not present

## 2015-11-03 DIAGNOSIS — J45909 Unspecified asthma, uncomplicated: Secondary | ICD-10-CM | POA: Insufficient documentation

## 2015-11-03 DIAGNOSIS — Z202 Contact with and (suspected) exposure to infections with a predominantly sexual mode of transmission: Secondary | ICD-10-CM | POA: Insufficient documentation

## 2015-11-03 LAB — URINE MICROSCOPIC-ADD ON

## 2015-11-03 LAB — POCT PREGNANCY, URINE: PREG TEST UR: NEGATIVE

## 2015-11-03 LAB — URINALYSIS, ROUTINE W REFLEX MICROSCOPIC
Bilirubin Urine: NEGATIVE
GLUCOSE, UA: NEGATIVE mg/dL
Ketones, ur: 15 mg/dL — AB
LEUKOCYTES UA: NEGATIVE
Nitrite: NEGATIVE
PROTEIN: NEGATIVE mg/dL
pH: 5.5 (ref 5.0–8.0)

## 2015-11-03 LAB — WET PREP, GENITAL
Clue Cells Wet Prep HPF POC: NONE SEEN
SPERM: NONE SEEN
Trich, Wet Prep: NONE SEEN
YEAST WET PREP: NONE SEEN

## 2015-11-03 MED ORDER — AZITHROMYCIN 250 MG PO TABS
1000.0000 mg | ORAL_TABLET | Freq: Once | ORAL | Status: AC
  Administered 2015-11-03: 1000 mg via ORAL
  Filled 2015-11-03: qty 4

## 2015-11-03 MED ORDER — CEFTRIAXONE SODIUM 250 MG IJ SOLR
250.0000 mg | Freq: Once | INTRAMUSCULAR | Status: AC
  Administered 2015-11-03: 250 mg via INTRAMUSCULAR
  Filled 2015-11-03: qty 250

## 2015-11-03 NOTE — MAU Provider Note (Signed)
Chief Complaint: Exposure to STD   First Provider Initiated Contact with Patient 11/03/15 2125     SUBJECTIVE HPI: Gail Walker is a 25 y.o. [id number] female who presents to Maternity Admissions reporting gonorrhea exposure. States her partner's partner tested positive for gonorrhea.  Associated signs and symptoms: Positive for malodorous discharge and vaginal irritation. Negative for fever, chills, abdominal pain, vaginal bleeding.  Past Medical History  Diagnosis Date  . Chlamydia 2012  . Acne   . Asthma     uses albuterol inhaler daily  . GERD (gastroesophageal reflux disease)     during pregnancy - takes zantac   OB History  Gravida Para Term Preterm AB SAB TAB Ectopic Multiple Living  2 1 1  0 1 0 1 0 0 1    # Outcome Date GA Lbr Len/2nd Weight Sex Delivery Anes PTL Lv  2 Term 07/11/11 [redacted]w[redacted]d 19:42 / 02:26 7 lb 11.5 oz (3.5 kg) M Vag-Spont EPI  Y  1 TAB 07/2010             Past Surgical History  Procedure Laterality Date  . Pregnancy termination    . No past surgeries     Social History   Social History  . Marital Status: Single    Spouse Name: N/A  . Number of Children: N/A  . Years of Education: N/A   Occupational History  . Not on file.   Social History Main Topics  . Smoking status: Former Smoker -- 0.25 packs/day    Quit date: 03/03/2011  . Smokeless tobacco: Never Used  . Alcohol Use: No  . Drug Use: No  . Sexual Activity: Not on file   Other Topics Concern  . Not on file   Social History Narrative   No current facility-administered medications on file prior to encounter.   Current Outpatient Prescriptions on File Prior to Encounter  Medication Sig Dispense Refill  . albuterol (PROVENTIL HFA;VENTOLIN HFA) 108 (90 Base) MCG/ACT inhaler Inhale 1-2 puffs into the lungs every 6 (six) hours as needed for wheezing or shortness of breath. 1 Inhaler 0  . budesonide (PULMICORT FLEXHALER) 180 MCG/ACT inhaler Use 2 puffs daily. (Patient not taking: Reported  on 11/03/2015) 1 Inhaler 1  . cetirizine (ZYRTEC) 10 MG tablet TAKE 1 TABLET BY MOUTH DAILY. (Patient not taking: Reported on 11/03/2015) 30 tablet 6  . desonide (DESOWEN) 0.05 % cream Apply topically 2 (two) times daily. (Patient not taking: Reported on 11/03/2015) 30 g 1  . fluticasone (FLONASE) 50 MCG/ACT nasal spray Place 2 sprays into both nostrils daily. (Patient not taking: Reported on 11/03/2015) 16 g 6  . Ipratropium-Albuterol (COMBIVENT) 20-100 MCG/ACT AERS respimat Inhale 1 puff into the lungs every 6 (six) hours as needed for wheezing. (Patient not taking: Reported on 11/03/2015) 4 g 2  . montelukast (SINGULAIR) 10 MG tablet Take 1 tablet (10 mg total) by mouth at bedtime. (Patient not taking: Reported on 11/03/2015) 30 tablet 0  . predniSONE (DELTASONE) 20 MG tablet TAKE 3 TABLETS BY MOUTH DAILY (Patient not taking: Reported on 11/03/2015) 15 tablet 0  . PROAIR HFA 108 (90 Base) MCG/ACT inhaler INHALE 2 PUFFS INTO THE LUNGS EVERY 6 HOURS AS NEEDED FOR SHORTNESS OF BREATH (Patient not taking: Reported on 11/03/2015) 8.5 Inhaler 1  . triamcinolone ointment (KENALOG) 0.1 % APPLY TO ECZEMA TOPICALLY TWICE DAILY AS NEEDED (Patient not taking: Reported on 11/03/2015) 15 g 0   No Known Allergies  I have reviewed the past Medical Hx, Surgical Hx,  Social Hx, Allergies and Medications.   Review of Systems  Constitutional: Negative for fever and chills.  Gastrointestinal: Negative for abdominal pain.  Genitourinary: Positive for vaginal discharge. Negative for vaginal bleeding and vaginal pain.    OBJECTIVE Patient Vitals for the past 24 hrs:  BP Temp Temp src Pulse Resp Height Weight  11/03/15 1728 133/82 mmHg 98.6 F (37 C) Oral 99 18  (1.626 m) 227 lb (102.967 kg)   Constitutional: Well-developed, well-nourished female in no acute distress.  Cardiovascular: normal rate Respiratory: normal rate and effort.  GI: Abd soft, non-tender. Neurologic: Alert and oriented x 4.  GU: Wet prep,  GC/chlamydia cultures collected by RN. Declined pelvic exam by CNM.  LAB RESULTS Results for orders placed or performed during the hospital encounter of 11/03/15 (from the past 24 hour(s))  Urinalysis, Routine w reflex microscopic (not at [facility])     Status: Abnormal   Collection Time: 11/03/15  5:32 PM  Result Value Ref Range   Color, Urine YELLOW YELLOW   APPearance CLEAR CLEAR   Specific Gravity, Urine >1.030 (H) 1.005 - 1.030   pH 5.5 5.0 - 8.0   Glucose, UA NEGATIVE NEGATIVE mg/dL   Hgb urine dipstick TRACE (A) NEGATIVE   Bilirubin Urine NEGATIVE NEGATIVE   Ketones, ur 15 (A) NEGATIVE mg/dL   Protein, ur NEGATIVE NEGATIVE mg/dL   Nitrite NEGATIVE NEGATIVE   Leukocytes, UA NEGATIVE NEGATIVE  Urine microscopic-add on     Status: Abnormal   Collection Time: 11/03/15  5:32 PM  Result Value Ref Range   Squamous Epithelial / LPF 0-5 (A) NONE SEEN   WBC, UA 0-5 0 - 5 WBC/hpf   RBC / HPF 0-5 0 - 5 RBC/hpf   Bacteria, UA FEW (A) NONE SEEN  Pregnancy, urine POC     Status: None   Collection Time: 11/03/15  5:47 PM  Result Value Ref Range   Preg Test, Ur NEGATIVE NEGATIVE  Wet prep, genital     Status: Abnormal   Collection Time: 11/03/15  8:43 PM  Result Value Ref Range   Yeast Wet Prep HPF POC NONE SEEN NONE SEEN   Trich, Wet Prep NONE SEEN NONE SEEN   Clue Cells Wet Prep HPF POC NONE SEEN NONE SEEN   WBC, Wet Prep HPF POC MODERATE (A) NONE SEEN   Sperm NONE SEEN     IMAGING No results found.  MAU COURSE Wet prep, GC/mid ear cultures, UA, UPT, HIV, Rocephin, azithromycin.  MDM - 25 year old non-pregnant female with known gonorrhea exposure. Treated in maternity admissions. No evidence of pelvic inflammatory disease or other emergent condition.  ASSESSMENT 1. STD exposure     PLAN Discharge home in stable condition. PID Precautions GC/chlamydia cultures, HIV pending Offered expedited partner therapy. Patient declines. States she's no longer with that  partner. Recommend condoms. Follow-up Information    Follow up with Gynecologist of your choice.   Why:  For non-emergent gynecology care      Follow up with [facility] [place] [facility].   Why:  As needed in emergencies   Contact information:   [address] [id number] [place] [address] [phone]       Medication List    STOP taking these medications        budesonide 180 MCG/ACT inhaler  Commonly known as:  PULMICORT FLEXHALER     cetirizine 10 MG tablet  Commonly known as:  ZYRTEC     desonide 0.05 %  cream  Commonly known as:  DESOWEN     fluticasone 50 MCG/ACT nasal spray  Commonly known as:  FLONASE     Ipratropium-Albuterol 20-100 MCG/ACT Aers respimat  Commonly known as:  COMBIVENT     montelukast 10 MG tablet  Commonly known as:  SINGULAIR     predniSONE 20 MG tablet  Commonly known as:  DELTASONE     triamcinolone ointment 0.1 %  Commonly known as:  KENALOG      TAKE these medications        albuterol 108 (90 Base) MCG/ACT inhaler  Commonly known as:  PROVENTIL HFA;VENTOLIN HFA  Inhale 1-2 puffs into the lungs every 6 (six) hours as needed for wheezing or shortness of breath.       [place] An[place] Lorenzo, CNM 11/03/2015  9:25 PM

## 2015-11-03 NOTE — MAU Note (Signed)
Pt thinks she may have gonorrhea, pt's partner has a partner who states she has gonorrhea.  Has white vaginal discharge, odor & itching.  Denies pain.

## 2015-11-03 NOTE — Discharge Instructions (Signed)
Sexually Transmitted Disease °A sexually transmitted disease (STD) is a disease or infection that may be passed (transmitted) from person to person, usually during sexual activity. This may happen by way of saliva, semen, blood, vaginal mucus, or urine. Common STDs include: °· Gonorrhea. °· Chlamydia. °· Syphilis. °· HIV and AIDS. °· Genital herpes. °· Hepatitis B and C. °· Trichomonas. °· Human papillomavirus (HPV). °· Pubic lice. °· Scabies. °· Mites. °· Bacterial vaginosis. °WHAT ARE CAUSES OF STDs? °An STD may be caused by bacteria, a virus, or parasites. STDs are often transmitted during sexual activity if one person is infected. However, they may also be transmitted through nonsexual means. STDs may be transmitted after:  °· Sexual intercourse with an infected person. °· Sharing sex toys with an infected person. °· Sharing needles with an infected person or using unclean piercing or tattoo needles. °· Having intimate contact with the genitals, mouth, or rectal areas of an infected person. °· Exposure to infected fluids during birth. °WHAT ARE THE SIGNS AND SYMPTOMS OF STDs? °Different STDs have different symptoms. Some people may not have any symptoms. If symptoms are present, they may include: °· Painful or bloody urination. °· Pain in the pelvis, abdomen, vagina, anus, throat, or eyes. °· A skin rash, itching, or irritation. °· Growths, ulcerations, blisters, or sores in the genital and anal areas. °· Abnormal vaginal discharge with or without bad odor. °· Penile discharge in men. °· Fever. °· Pain or bleeding during sexual intercourse. °· Swollen glands in the groin area. °· Yellow skin and eyes (jaundice). This is seen with hepatitis. °· Swollen testicles. °· Infertility. °· Sores and blisters in the mouth. °HOW ARE STDs DIAGNOSED? °To make a diagnosis, your health care provider may: °· Take a medical history. °· Perform a physical exam. °· Take a sample of any discharge to examine. °· Swab the throat,  cervix, opening to the penis, rectum, or vagina for testing. °· Test a sample of your first morning urine. °· Perform blood tests. °· Perform a Pap test, if this applies. °· Perform a colposcopy. °· Perform a laparoscopy. °HOW ARE STDs TREATED? °Treatment depends on the STD. Some STDs may be treated but not cured. °· Chlamydia, gonorrhea, trichomonas, and syphilis can be cured with antibiotic medicine. °· Genital herpes, hepatitis, and HIV can be treated, but not cured, with prescribed medicines. The medicines lessen symptoms. °· Genital warts from HPV can be treated with medicine or by freezing, burning (electrocautery), or surgery. Warts may come back. °· HPV cannot be cured with medicine or surgery. However, abnormal areas may be removed from the cervix, vagina, or vulva. °· If your diagnosis is confirmed, your recent sexual partners need treatment. This is true even if they are symptom-free or have a negative culture or evaluation. They should not have sex until their health care providers say it is okay. °· Your health care provider may test you for infection again 3 months after treatment. °HOW CAN I REDUCE MY RISK OF GETTING AN STD? °Take these steps to reduce your risk of getting an STD: °· Use latex condoms, dental dams, and water-soluble lubricants during sexual activity. Do not use petroleum jelly or oils. °· Avoid having multiple sex partners. °· Do not have sex with someone who has other sex partners °· Do not have sex with anyone you do not know or who is at high risk for an STD. °· Avoid risky sex practices that can break your skin. °· Do not have sex   if you have open sores on your mouth or skin. °· Avoid drinking too much alcohol or taking illegal drugs. Alcohol and drugs can affect your judgment and put you in a vulnerable position. °· Avoid engaging in oral and anal sex acts. °· Get vaccinated for HPV and hepatitis. If you have not received these vaccines in the past, talk to your health care  provider about whether one or both might be right for you. °· If you are at risk of being infected with HIV, it is recommended that you take a prescription medicine daily to prevent HIV infection. This is called pre-exposure prophylaxis (PrEP). You are considered at risk if: °¨ You are a man who has sex with other men (MSM). °¨ You are a heterosexual man or woman and are sexually active with more than one partner. °¨ You take drugs by injection. °¨ You are sexually active with a partner who has HIV. °· Talk with your health care provider about whether you are at high risk of being infected with HIV. If you choose to begin PrEP, you should first be tested for HIV. You should then be tested every 3 months for as long as you are taking PrEP. °WHAT SHOULD I DO IF I THINK I HAVE AN STD? °· See your health care provider. °· Tell your sexual partner(s). They should be tested and treated for any STDs. °· Do not have sex until your health care provider says it is okay. °WHEN SHOULD I GET IMMEDIATE MEDICAL CARE? °Contact your health care provider right away if:  °· You have severe abdominal pain. °· You are a man and notice swelling or pain in your testicles. °· You are a woman and notice swelling or pain in your vagina. °  °This information is not intended to replace advice given to you by your health care provider. Make sure you discuss any questions you have with your health care provider. °  °Document Released: 06/25/2002 Document Revised: 04/25/2014 Document Reviewed: 10/23/2012 °Elsevier Interactive Patient Education ©2016 [employer] ° °

## 2015-11-03 NOTE — MAU Note (Signed)
Lab called pt for bloodwork, not in lobby 

## 2015-11-03 NOTE — MAU Note (Signed)
Lab called pt for bloodwork, not in lobby

## 2015-11-04 LAB — GC/CHLAMYDIA PROBE AMP (~~LOC~~) NOT AT ARMC
Chlamydia: NEGATIVE
Neisseria Gonorrhea: NEGATIVE

## 2015-11-05 LAB — HIV ANTIBODY (ROUTINE TESTING W REFLEX): HIV Screen 4th Generation wRfx: NONREACTIVE

## 2015-11-14 ENCOUNTER — Observation Stay (HOSPITAL_COMMUNITY)
Admission: EM | Admit: 2015-11-14 | Discharge: 2015-11-15 | Disposition: A | Discharge status: Home / Self Care | Payer: Medicaid Other | Attending: Internal Medicine | Admitting: Internal Medicine

## 2015-11-14 ENCOUNTER — Emergency Department (HOSPITAL_COMMUNITY): Payer: Medicaid Other

## 2015-11-14 ENCOUNTER — Encounter (HOSPITAL_COMMUNITY): Payer: Self-pay

## 2015-11-14 DIAGNOSIS — J45909 Unspecified asthma, uncomplicated: Secondary | ICD-10-CM | POA: Insufficient documentation

## 2015-11-14 DIAGNOSIS — Z79899 Other long term (current) drug therapy: Secondary | ICD-10-CM | POA: Diagnosis not present

## 2015-11-14 DIAGNOSIS — Z87891 Personal history of nicotine dependence: Secondary | ICD-10-CM | POA: Diagnosis not present

## 2015-11-14 DIAGNOSIS — J189 Pneumonia, unspecified organism: Secondary | ICD-10-CM

## 2015-11-14 DIAGNOSIS — F329 Major depressive disorder, single episode, unspecified: Secondary | ICD-10-CM | POA: Insufficient documentation

## 2015-11-14 DIAGNOSIS — F17200 Nicotine dependence, unspecified, uncomplicated: Secondary | ICD-10-CM | POA: Diagnosis present

## 2015-11-14 DIAGNOSIS — R0602 Shortness of breath: Secondary | ICD-10-CM | POA: Diagnosis present

## 2015-11-14 HISTORY — DX: Pneumonia, unspecified organism: J18.9

## 2015-11-14 LAB — BASIC METABOLIC PANEL
ANION GAP: 8 (ref 5–15)
BUN: 10 mg/dL (ref 6–20)
CO2: 23 mmol/L (ref 22–32)
Calcium: 9 mg/dL (ref 8.9–10.3)
Chloride: 103 mmol/L (ref 101–111)
Creatinine, Ser: 0.98 mg/dL (ref 0.44–1.00)
GFR calc Af Amer: >60 mL/min (ref >60)
Glucose, Bld: 88 mg/dL (ref 65–99)
POTASSIUM: 3.5 mmol/L (ref 3.5–5.1)
SODIUM: 134 mmol/L — AB (ref 135–145)

## 2015-11-14 LAB — CBC WITH DIFFERENTIAL/PLATELET
BASOS ABS: 0 10*3/uL (ref 0.0–0.1)
Basophils Relative: 0 %
EOS ABS: 0.2 10*3/uL (ref 0.0–0.7)
EOS PCT: 1 %
HCT: 39.8 % (ref 36.0–46.0)
HEMOGLOBIN: 13.5 g/dL (ref 12.0–15.0)
LYMPHS PCT: 14 %
Lymphs Abs: 2.1 10*3/uL (ref 0.7–4.0)
MCH: 27.5 pg (ref 26.0–34.0)
MCHC: 33.9 g/dL (ref 30.0–36.0)
MCV: 81.1 fL (ref 78.0–100.0)
Monocytes Absolute: 1.3 10*3/uL — ABNORMAL HIGH (ref 0.1–1.0)
Monocytes Relative: 9 %
NEUTROS PCT: 76 %
Neutro Abs: 11 10*3/uL — ABNORMAL HIGH (ref 1.7–7.7)
PLATELETS: 301 10*3/uL (ref 150–400)
RBC: 4.91 MIL/uL (ref 3.87–5.11)
RDW: 13.7 % (ref 11.5–15.5)
WBC: 14.7 10*3/uL — AB (ref 4.0–10.5)

## 2015-11-14 MED ORDER — SODIUM CHLORIDE 0.9% FLUSH
3.0000 mL | Freq: Two times a day (BID) | INTRAVENOUS | Status: DC
  Administered 2015-11-14: 3 mL via INTRAVENOUS

## 2015-11-14 MED ORDER — DEXTROSE 5 % IV SOLN: 1.0000 g | INTRAVENOUS | Status: DC

## 2015-11-14 MED ORDER — IPRATROPIUM-ALBUTEROL 0.5-2.5 (3) MG/3ML IN SOLN
3.0000 mL | Freq: Four times a day (QID) | RESPIRATORY_TRACT | Status: DC
  Administered 2015-11-15 (×2): 3 mL via RESPIRATORY_TRACT
  Filled 2015-11-14 (×3): qty 3

## 2015-11-14 MED ORDER — SODIUM CHLORIDE 0.9% FLUSH: 3.0000 mL | INTRAVENOUS | Status: DC | PRN

## 2015-11-14 MED ORDER — AZITHROMYCIN 250 MG PO TABS: 500.0000 mg | ORAL_TABLET | ORAL | Status: DC

## 2015-11-14 MED ORDER — SODIUM CHLORIDE 0.9 % IV BOLUS (SEPSIS)
1000.0000 mL | Freq: Once | INTRAVENOUS | Status: AC
  Administered 2015-11-14: 1000 mL via INTRAVENOUS

## 2015-11-14 MED ORDER — ACETAMINOPHEN 325 MG PO TABS
650.0000 mg | ORAL_TABLET | Freq: Once | ORAL | Status: AC
  Administered 2015-11-14: 650 mg via ORAL
  Filled 2015-11-14: qty 2

## 2015-11-14 MED ORDER — NICOTINE 14 MG/24HR TD PT24
14.0000 mg | MEDICATED_PATCH | Freq: Every day | TRANSDERMAL | Status: DC
  Administered 2015-11-14 – 2015-11-15 (×2): 14 mg via TRANSDERMAL
  Filled 2015-11-14 (×3): qty 1

## 2015-11-14 MED ORDER — PREDNISONE 20 MG PO TABS
20.0000 mg | ORAL_TABLET | Freq: Every day | ORAL | Status: DC
  Administered 2015-11-15: 20 mg via ORAL
  Filled 2015-11-14: qty 1

## 2015-11-14 MED ORDER — DEXTROSE 5 % IV SOLN
1.0000 g | Freq: Once | INTRAVENOUS | Status: AC
  Administered 2015-11-14: 1 g via INTRAVENOUS
  Filled 2015-11-14: qty 10

## 2015-11-14 MED ORDER — ALBUTEROL SULFATE (2.5 MG/3ML) 0.083% IN NEBU: 2.5000 mg | INHALATION_SOLUTION | RESPIRATORY_TRACT | Status: DC | PRN

## 2015-11-14 MED ORDER — SODIUM CHLORIDE 0.9 % IV SOLN: 250.0000 mL | INTRAVENOUS | Status: DC | PRN

## 2015-11-14 MED ORDER — ENOXAPARIN SODIUM 40 MG/0.4ML ~~LOC~~ SOLN
40.0000 mg | SUBCUTANEOUS | Status: DC
  Administered 2015-11-14: 40 mg via SUBCUTANEOUS
  Filled 2015-11-14: qty 0.4

## 2015-11-14 MED ORDER — METHYLPREDNISOLONE SODIUM SUCC 125 MG IJ SOLR
125.0000 mg | Freq: Once | INTRAMUSCULAR | Status: AC
  Administered 2015-11-14: 125 mg via INTRAVENOUS
  Filled 2015-11-14: qty 2

## 2015-11-14 MED ORDER — ALBUTEROL SULFATE (2.5 MG/3ML) 0.083% IN NEBU
5.0000 mg | INHALATION_SOLUTION | Freq: Once | RESPIRATORY_TRACT | Status: AC
  Administered 2015-11-14: 5 mg via RESPIRATORY_TRACT
  Filled 2015-11-14: qty 6

## 2015-11-14 MED ORDER — LACTATED RINGERS IV SOLN
INTRAVENOUS | Status: AC
  Administered 2015-11-15: 02:00:00 via INTRAVENOUS

## 2015-11-14 MED ORDER — DEXTROSE 5 % IV SOLN
500.0000 mg | Freq: Once | INTRAVENOUS | Status: AC
  Administered 2015-11-14: 500 mg via INTRAVENOUS
  Filled 2015-11-14: qty 500

## 2015-11-14 NOTE — ED Triage Notes (Signed)
She states she has had problems with asthma for several days--worse past two days.  She states she has used much albuterol (inhaler) today and yesterday. She has audible wheezes at present and is moderately short of breath.

## 2015-11-14 NOTE — ED Provider Notes (Signed)
[facility]-EMERGENCY DEPT Provider Note   CSN: [id number] Arrival date & time: 11/14/15  1557  First Provider Contact:  First MD Initiated Contact with Patient 11/14/15 1650        History   Chief Complaint Chief Complaint  Patient presents with  . Asthma    HPI Gail Walker is a 25 y.o. female.  HPI Patient presents with dyspnea. Symptoms began yesterday, and after she developed shortness of breath, she also developed cough, subjective fever, generalized discomfort, headache. Since onset symptoms of been persistent, with no relief with albuterol, or OTC medication. No confusion, disorientation, nausea, vomiting, diarrhea. Patient states that beyond asthma she is generally well and has no recent medical issues.  Past Medical History:  Diagnosis Date  . Acne   . Asthma    uses albuterol inhaler daily  . Chlamydia 2012  . GERD (gastroesophageal reflux disease)    during pregnancy - takes zantac    Patient Active Problem List   Diagnosis Date Noted  . Vaginal discharge 04/27/2012  . Contraception management 04/27/2012  . Health care maintenance 04/27/2012  . Possible exposure to STD 02/28/2012  . Depression 11/14/2011  . Acne 04/26/2011  . TOBACCO USER 12/27/2008  . OBESITY, NOS 06/15/2006  . RHINITIS, ALLERGIC 06/15/2006  . Asthma 06/15/2006  . ECZEMA, ATOPIC DERMATITIS 06/15/2006    Past Surgical History:  Procedure Laterality Date  . NO PAST SURGERIES    . pregnancy termination      OB History    Gravida Para Term Preterm AB Living   0 1 1   SAB TAB Ectopic Multiple Live Births   0 1 0 0         Home Medications    Prior to Admission medications   Medication Sig Start Date End Date Taking? Authorizing Provider  albuterol (PROVENTIL HFA;VENTOLIN HFA) 108 (90 Base) MCG/ACT inhaler Inhale 1-2 puffs into the lungs every 6 (six) hours as needed for wheezing or shortness of breath. 05/11/15  Yes Melton Krebs, PA-C  PULMICORT 0.5 MG/2ML  nebulizer solution Inhale 1 ampule into the lungs 2 (two) times daily as needed for shortness of breath or wheezing. 08/11/15  Yes Historical Provider, MD    Family History Family History  Problem Relation Age of Onset  . Cancer Maternal Aunt   . Anesthesia problems Neg Hx   . Hearing loss Neg Hx     Social History Social History  Substance Use Topics  . Smoking status: Former Smoker    Packs/day: 0.25    Quit date: 03/03/2011  . Smokeless tobacco: Never Used  . Alcohol use No     Allergies   Review of patient's allergies indicates no known allergies.   Review of Systems Review of Systems  Constitutional:       Per HPI, otherwise negative  HENT:       Per HPI, otherwise negative  Respiratory:       Per HPI, otherwise negative  Cardiovascular:       Per HPI, otherwise negative  Gastrointestinal: Negative for vomiting.  Endocrine:       Negative aside from HPI  Genitourinary:       Neg aside from HPI   Musculoskeletal:       Per HPI, otherwise negative  Skin: Negative.   Neurological: Negative for syncope.     Physical Exam Updated Vital Signs BP 118/82 (BP Location: Left Arm)   Pulse (!) 137   Temp 99.5 F (37.5  C) (Oral)   Resp 20   LMP 10/31/2015   SpO2 94%   Physical Exam  Constitutional: She is oriented to person, place, and time. She appears well-developed and well-nourished. No distress.  HENT:  Head: Normocephalic and atraumatic.  Eyes: Conjunctivae and EOM are normal.  Cardiovascular: Regular rhythm.   Tachycardic  Pulmonary/Chest: No stridor. She is in respiratory distress.  Respiratory distress, accessory muscle use, bilateral wheezing  Abdominal: She exhibits no distension.  Musculoskeletal: She exhibits no edema.  Neurological: She is alert and oriented to person, place, and time. No cranial nerve deficit.  Skin: Skin is warm and dry.  Psychiatric: She has a normal mood and affect.  Nursing note and vitals reviewed.    ED  Treatments / Results  Labs (all labs ordered are listed, but only abnormal results are displayed) Labs Reviewed - No data to display  EKG  EKG Interpretation  Date/Time:  Saturday November 14 2015 16:52:54 EDT Ventricular Rate:  121 PR Interval:    QRS Duration: 77 QT Interval:  302 QTC Calculation: 429 R Axis:   83 Text Interpretation:  Sinus tachycardia Abnormal ekg Confirmed by Gerhard Munch  MD [phone]) on 11/14/2015 5:01:00 PM       Radiology Dg Chest 2 View  Result Date: 11/14/2015 CLINICAL DATA:  Shortness of breath and weakness EXAM: CHEST  2 VIEW COMPARISON:  08/04/2015 FINDINGS: Cardiac shadow is within normal limits. The lungs are well aerated bilaterally. Right middle lobe infiltrate is noted. No sizable effusion is seen. No bony abnormality is noted. IMPRESSION: Right middle lobe pneumonia. Electronically Signed   By: Alcide Clever M.D.   On: 11/14/2015 17:11   Procedures Procedures (including critical care time) After the initial evaluation patient received albuterol therapy, multiple doses.  On repeat exam she had persistent wheezing.  Medications Ordered in ED Medications  sodium chloride 0.9 % bolus 1,000 mL (not administered)  methylPREDNISolone sodium succinate (SOLU-MEDROL) 125 mg/2 mL injection 125 mg (not administered)  azithromycin (ZITHROMAX) 500 mg in dextrose 5 % 250 mL IVPB (not administered)  cefTRIAXone (ROCEPHIN) 1 g in dextrose 5 % 50 mL IVPB (not administered)  albuterol (PROVENTIL) (2.5 MG/3ML) 0.083% nebulizer solution 5 mg (5 mg Nebulization Given 11/14/15 1700)  acetaminophen (TYLENOL) tablet 650 mg (650 mg Oral Given 11/14/15 1825)     Initial Impression / Assessment and Plan / ED Course  I have reviewed the triage vital signs and the nursing notes.  Pertinent labs & imaging results that were available during my care of the patient were reviewed by me and considered in my medical decision making (see chart for details).  On repeat exam the  patient still has mild dyspnea. With persistent cough, tachycardia, new pneumonia, the patient will be admitted.   Final Clinical Impressions(s) / ED Diagnoses   Patient is a previously well young female with history of asthma presenting with new dyspnea, cough, fever. Here the patient has multiple breathing treatments, with some improvement in her condition. However, given the patient's new finding of pneumonia, she required admission for further evaluation and management.  New Prescriptions New Prescriptions   No medications on file     Gerhard Munch, MD 11/14/15 1940

## 2015-11-14 NOTE — H&P (Signed)
History and Physical    Gail Walker ZOX:[id number] DOB: [birth year] DOA: 11/14/2015  PCP: Gwendolyn Grant Consultants:  None Patient coming from: home - lives with her mother  Chief Complaint: SOB  HPI: Gail Walker is a 25 y.o. female with medical history significant of asthma.  During our evaluation, she was distracted by her cell phone conversation, her friend/family member in the room, her thirst, her uncomfortable IV, and her [employer] dinner - and so she was not eager to answer my questions.  She reports that she started having cold sweats starting yesterday.  Difficulty breathing yesterday.  Was asleep and woke up and decided to come to hospital.  Reports a little better now.  Subjective fever.  +cough - productive of yellow sputum.  +URI symptoms. No sick contacts.  Uses albuterol daily, more than once a day.  Prescribed Pulmicort a month ago and has not used it yet.   ED Course:  Multiple albuterol treatments with persistent wheezing; persistent tachycardia; infiltrate on CXR - treated with Rocephin and Azithromycin  Review of Systems: As per HPI; otherwise 10 point review of systems reviewed and negative.   Ambulatory Status:  Ambulates without assistance  Past Medical History:  Diagnosis Date  . Acne   . Asthma    uses albuterol inhaler daily  . Chlamydia 2012  . GERD (gastroesophageal reflux disease)    during pregnancy - takes zantac    Past Surgical History:  Procedure Laterality Date  . pregnancy termination      Social History   Social History  . Marital status: Single    Spouse name: N/A  . Number of children: N/A  . Years of education: N/A   Occupational History  . warehouse work in a freezer    Social History Main Topics  . Smoking status: Current Every Day Smoker    Packs/day: 0.75    Years: 3.00  . Smokeless tobacco: Never Used  . Alcohol use No  . Drug use: No  . Sexual activity: Not on file   Other Topics Concern  . Not on file   Social  History Narrative  . No narrative on file    No Known Allergies  Family History  Problem Relation Age of Onset  . Cancer Maternal Aunt   . Anesthesia problems Neg Hx   . Hearing loss Neg Hx     Prior to Admission medications   Medication Sig Start Date End Date Taking? Authorizing Provider  albuterol (PROVENTIL HFA;VENTOLIN HFA) 108 (90 Base) MCG/ACT inhaler Inhale 1-2 puffs into the lungs every 6 (six) hours as needed for wheezing or shortness of breath. 05/11/15  Yes Melton Krebs, PA-C  PULMICORT 0.5 MG/2ML nebulizer solution Inhale 1 ampule into the lungs 2 (two) times daily as needed for shortness of breath or wheezing. 08/11/15  Yes Historical Provider, MD    Physical Exam: Vitals:   11/14/15 2000 11/14/15 2004 11/14/15 2011 11/14/15 2121  BP: 120/76 120/76 120/76 126/79  Pulse:  112 (!) 121 (!) 108  Resp: Temp:    98.3 F (36.8 C)  TempSrc:    Oral  SpO2:   95% 95%  Weight:  104.3 kg (230 lb)  101 kg (222 lb 10.6 oz)  Height:   (1.626 m)   (1.626 m)     General:  Appears calm and comfortable and is NAD Eyes:  PERRL, EOMI, normal lids, iris ENT:  grossly normal hearing, lips & tongue, mmm Neck:  no LAD, masses or thyromegaly Cardiovascular: tachycardia, no m/r/g. No LE edema.  Respiratory:  Normal respiratory effort, no tachypnea.  Diffuse wheezing with moderate air movement.  No rales/ronchi appreciated. Abdomen: Obese, soft, ntnd, NABS Skin:  no rash or induration seen on limited exam Musculoskeletal:  grossly normal tone BUE/BLE, good ROM, no bony abnormality Psychiatric:  grossly normal mood and affect, speech fluent and appropriate, AOx3 Neurologic:  CN 2-12 grossly intact, moves all extremities in coordinated fashion, sensation intact  Labs on Admission: I have personally reviewed following labs and imaging studies  CBC:  Recent Labs Lab 11/14/15 1841  WBC 14.7*  NEUTROABS 11.0*  HGB 13.5  HCT 39.8  MCV 81.1  PLT 301     Basic Metabolic Panel:  Recent Labs Lab 11/14/15 1841  NA 134*  K 3.5  CL 103  CO2 23  GLUCOSE 88  BUN 10  CREATININE 0.98  CALCIUM 9.0   GFR: Estimated Creatinine Clearance: 102.3 mL/min (by C-G formula based on SCr of 0.98 mg/dL). Liver Function Tests: No results for input(s): AST, ALT, ALKPHOS, BILITOT, PROT, ALBUMIN in the last 168 hours. No results for input(s): LIPASE, AMYLASE in the last 168 hours. No results for input(s): AMMONIA in the last 168 hours. Coagulation Profile: No results for input(s): INR, PROTIME in the last 168 hours. Cardiac Enzymes: No results for input(s): CKTOTAL, CKMB, CKMBINDEX, TROPONINI in the last 168 hours. BNP (last 3 results) No results for input(s): PROBNP in the last 8760 hours. HbA1C: No results for input(s): HGBA1C in the last 72 hours. CBG: No results for input(s): GLUCAP in the last 168 hours. Lipid Profile: No results for input(s): CHOL, HDL, LDLCALC, TRIG, CHOLHDL, LDLDIRECT in the last 72 hours. Thyroid Function Tests: No results for input(s): TSH, T4TOTAL, FREET4, T3FREE, THYROIDAB in the last 72 hours. Anemia Panel: No results for input(s): VITAMINB12, FOLATE, FERRITIN, TIBC, IRON, RETICCTPCT in the last 72 hours. Urine analysis:    Component Value Date/Time   COLORURINE YELLOW 11/03/2015 1732   APPEARANCEUR CLEAR 11/03/2015 1732   LABSPEC >1.030 (H) 11/03/2015 1732   PHURINE 5.5 11/03/2015 1732   GLUCOSEU NEGATIVE 11/03/2015 1732   HGBUR TRACE (A) 11/03/2015 1732   BILIRUBINUR NEGATIVE 11/03/2015 1732   KETONESUR 15 (A) 11/03/2015 1732   PROTEINUR NEGATIVE 11/03/2015 1732   UROBILINOGEN 1.0 11/24/2014 0403   NITRITE NEGATIVE 11/03/2015 1732   LEUKOCYTESUR NEGATIVE 11/03/2015 1732    Creatinine Clearance: Estimated Creatinine Clearance: 102.3 mL/min (by C-G formula based on SCr of 0.98 mg/dL).  Sepsis Labs: @LABRCNTIP (procalcitonin:4,lacticidven:4) )No results found for this or any previous visit (from the  past 240 hour(s)).   Radiological Exams on Admission: Dg Chest 2 View  Result Date: 11/14/2015 CLINICAL DATA:  Shortness of breath and weakness EXAM: CHEST  2 VIEW COMPARISON:  08/04/2015 FINDINGS: Cardiac shadow is within normal limits. The lungs are well aerated bilaterally. Right middle lobe infiltrate is noted. No sizable effusion is seen. No bony abnormality is noted. IMPRESSION: Right middle lobe pneumonia. Electronically Signed   By: Alcide Clever M.D.   On: 11/14/2015 17:11   EKG: Independently reviewed.  Sinus tachycardia with rate 121;  no evidence of acute ischemia  Assessment/Plan Principal Problem:   CAP (community acquired pneumonia) Active Problems:   TOBACCO USER   Asthma    CAP -Patient with RML infiltrate on CXR -Will place in observation status due to persistent tachycardia -Continue Rocephin IV, PO Azithromycin -IVF provided in ER, will give 12 hours more IVF -Suspect  that patient will be fine for discharge in AM  Asthma -Poor outpatient control, likely related to non-compliance and poor follow up -Was given Pulmicort 1 month ago, filled it but has not started using it - even in the setting of worsening wheezing and SOB -Likely needs ongoing education -Was given Solumedrol in ER; will change to PO Prednisone 20 mg daily as her asthma exacerbation was not overly worrisome based on my evaluation tonight -Continue Albuterol nebs q2h prn -Of note, she works in a freezer at [employer]; given her asthma and possible worsening in a cold environment, I recommended that she consider a request for a different job within the warehouse  Tobacco dependence -Encourage cessation.  This was discussed with the patient and should be reviewed on an ongoing basis.   -Patch ordered  High risk behaviors -Patient seen in [facility] on 7/18 for possible gonorrhea exposure -Using only condoms for birth control -Ongoing tobacco use despite asthma -Medication non-compliance -Consider  depression screening either as in- or outpatient   DVT prophylaxis: Lovenox  Code Status:  Full  Family Communication: Friend or family member present throughout  Disposition Plan:  Home once clinically improved, likely tomorrow Consults called: None   Admission status: Observation - Med Surg    Jonah Blue MD [facility]  If 7PM-7AM, please contact night-coverage [email or web address] Password [facility]  11/14/2015, 9:45 PM

## 2015-11-15 DIAGNOSIS — J189 Pneumonia, unspecified organism: Secondary | ICD-10-CM

## 2015-11-15 DIAGNOSIS — J4550 Severe persistent asthma, uncomplicated: Secondary | ICD-10-CM | POA: Diagnosis not present

## 2015-11-15 DIAGNOSIS — F17200 Nicotine dependence, unspecified, uncomplicated: Secondary | ICD-10-CM | POA: Diagnosis not present

## 2015-11-15 LAB — CBC WITH DIFFERENTIAL/PLATELET
BASOS ABS: 0 10*3/uL (ref 0.0–0.1)
BASOS PCT: 0 %
EOS ABS: 0 10*3/uL (ref 0.0–0.7)
Eosinophils Relative: 0 %
HCT: 36.6 % (ref 36.0–46.0)
HEMOGLOBIN: 12.3 g/dL (ref 12.0–15.0)
Lymphocytes Relative: 4 %
Lymphs Abs: 0.5 10*3/uL — ABNORMAL LOW (ref 0.7–4.0)
MCH: 27.3 pg (ref 26.0–34.0)
MCHC: 33.6 g/dL (ref 30.0–36.0)
MCV: 81.2 fL (ref 78.0–100.0)
Monocytes Absolute: 0.1 10*3/uL (ref 0.1–1.0)
Monocytes Relative: 1 %
NEUTROS PCT: 95 %
Neutro Abs: 11.5 10*3/uL — ABNORMAL HIGH (ref 1.7–7.7)
Platelets: 283 10*3/uL (ref 150–400)
RBC: 4.51 MIL/uL (ref 3.87–5.11)
RDW: 13.7 % (ref 11.5–15.5)
WBC: 12.2 10*3/uL — AB (ref 4.0–10.5)

## 2015-11-15 LAB — BASIC METABOLIC PANEL
Anion gap: 7 (ref 5–15)
BUN: 9 mg/dL (ref 6–20)
CALCIUM: 8.8 mg/dL — AB (ref 8.9–10.3)
CHLORIDE: 107 mmol/L (ref 101–111)
CO2: 21 mmol/L — ABNORMAL LOW (ref 22–32)
CREATININE: 0.84 mg/dL (ref 0.44–1.00)
GFR calc non Af Amer: >60 mL/min (ref >60)
Glucose, Bld: 195 mg/dL — ABNORMAL HIGH (ref 65–99)
Potassium: 4.2 mmol/L (ref 3.5–5.1)
SODIUM: 135 mmol/L (ref 135–145)

## 2015-11-15 LAB — RAPID URINE DRUG SCREEN, HOSP PERFORMED
AMPHETAMINES: NOT DETECTED
Barbiturates: NOT DETECTED
Benzodiazepines: NOT DETECTED
COCAINE: NOT DETECTED
OPIATES: NOT DETECTED
TETRAHYDROCANNABINOL: POSITIVE — AB

## 2015-11-15 LAB — STREP PNEUMONIAE URINARY ANTIGEN: Strep Pneumo Urinary Antigen: NEGATIVE

## 2015-11-15 MED ORDER — IPRATROPIUM-ALBUTEROL 20-100 MCG/ACT IN AERS: 1.0000 | INHALATION_SPRAY | Freq: Four times a day (QID) | RESPIRATORY_TRACT | Status: DC

## 2015-11-15 MED ORDER — PULMICORT 0.5 MG/2ML IN SUSP: 0.5000 mg | Freq: Two times a day (BID) | RESPIRATORY_TRACT | 3 refills | Status: DC

## 2015-11-15 MED ORDER — PREDNISONE 20 MG PO TABS: 20.0000 mg | ORAL_TABLET | Freq: Every day | ORAL | 0 refills | Status: DC

## 2015-11-15 MED ORDER — LEVOFLOXACIN 750 MG PO TABS: 750.0000 mg | ORAL_TABLET | Freq: Every day | ORAL | 0 refills | Status: DC

## 2015-11-15 NOTE — Discharge Summary (Signed)
Physician Discharge Summary  Gail Walker ZOX:[id number] DOB: 1991-03-08 DOA: 11/14/2015  PCP: Caryl Ada, DO  Admit date: 11/14/2015 Discharge date: 11/15/2015   Recommendations for Outpatient Follow-Up:   1. Stop smoking    Discharge Diagnosis:   Principal Problem:   CAP (community acquired pneumonia) Active Problems:   TOBACCO USER   Asthma   Discharge disposition:  Home.   Discharge Condition: Improved.  Diet recommendation: Regular.  Wound care: None.   History of Present Illness:   Gail Walker is a 25 y.o. female with medical history significant of asthma.  During our evaluation, she was distracted by her cell phone conversation, her friend/family member in the room, her thirst, her uncomfortable IV, and her [employer] dinner - and so she was not eager to answer my questions.  She reports that she started having cold sweats starting yesterday.  Difficulty breathing yesterday.  Was asleep and woke up and decided to come to hospital.  Reports a little better now.  Subjective fever.  +cough - productive of yellow sputum.  +URI symptoms. No sick contacts.  Uses albuterol daily, more than once a day.  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  This was discussed with the patient and should be reviewed on an  ongoing basis.     High risk behaviors -Consider depression screening either as outpatient    Medical Consultants:    None.   Discharge Exam:   Vitals:   11/14/15 2121 11/15/15 0725  BP: 126/79 135/86  Pulse: (!) 108 89  Resp: 16 16  Temp: 98.3 F (36.8 C) 97.5 F (36.4 C)   Vitals:   11/14/15 2011 11/14/15 2121 11/15/15 0724 11/15/15 0725  BP: 120/76 126/79  135/86  Pulse: (!) 121 (!) 108  89  Resp: Temp:  98.3 F (36.8 C)  97.5 F (36.4 C)  TempSrc:  Oral  Oral  SpO2: 95% 95% 95% 97%  Weight:  101 kg (222 lb 10.6 oz)    Height:   (1.626 m)      Gen:  NAD No wheezing   The results of significant diagnostics from this hospitalization (including imaging, microbiology, ancillary and laboratory) are listed below for reference.     Procedures and Diagnostic Studies:   Dg Chest 2 View  Result Date: 11/14/2015 CLINICAL DATA:  Shortness of breath and weakness EXAM: CHEST  2 VIEW COMPARISON:  08/04/2015 FINDINGS: Cardiac shadow is within normal limits. The lungs are well aerated bilaterally. Right middle lobe infiltrate is noted. No sizable effusion is seen. No bony abnormality is noted. IMPRESSION: Right middle lobe pneumonia. Electronically Signed   By: Alcide Clever M.D.   On: 11/14/2015 17:11    Labs:   Basic Metabolic Panel:  Recent Labs Lab 11/14/15 1841 11/15/15 0335  NA 134* 135  K 3.5 4.2  CL 103 107  CO2 23 21*  GLUCOSE 88 195*  BUN 10 9  CREATININE 0.98 0.84  CALCIUM 9.0 8.8*   GFR Estimated Creatinine Clearance: 119.3 mL/min (by C-G formula based on SCr of 0.84 mg/dL). Liver Function Tests: No results for input(s): AST, ALT, ALKPHOS, BILITOT, PROT, ALBUMIN in the last 168 hours. No results for input(s): LIPASE, AMYLASE in the last 168 hours. No results for input(s): AMMONIA in the last 168 hours. Coagulation profile No results for input(s): INR, PROTIME in the last 168 hours.  CBC:  Recent Labs Lab 11/14/15 1841  11/15/15 0335  WBC 14.7* 12.2*  NEUTROABS 11.0* 11.5*  HGB 13.5 12.3  HCT 39.8 36.6  MCV 81.1 81.2  PLT 301 283   Cardiac Enzymes: No results for input(s): CKTOTAL, CKMB, CKMBINDEX, TROPONINI in the last 168 hours. BNP: Invalid input(s): POCBNP CBG: No results for input(s): GLUCAP in the last 168 hours. D-Dimer No results for input(s): DDIMER in the last 72 hours. Hgb A1c No results for input(s): HGBA1C in the last 72 hours. Lipid Profile No results for input(s): CHOL, HDL, LDLCALC, TRIG, CHOLHDL, LDLDIRECT in the last 72 hours. Thyroid function studies No results for input(s): TSH, T4TOTAL, T3FREE, THYROIDAB in the last 72 hours.  Invalid input(s): FREET3 Anemia work up No results for input(s): VITAMINB12, FOLATE, FERRITIN, TIBC, IRON, RETICCTPCT in the last 72 hours. Microbiology No results found for this or any previous visit (from the past 240 hour(s)).   Discharge Instructions:   Discharge Instructions    Diet - low sodium heart healthy    Complete by:  As directed   Discharge instructions    Complete by:  As directed   Per Dr. Gwendolyn Grant, your asthma regimen is: pulmicort and combivent -albuterol is your rescue inhaler-- use PRN- others above are scheduled STOP SMOKING   Increase activity slowly    Complete by:  As directed       Medication List    TAKE these medications   albuterol 108 (90 Base) MCG/ACT inhaler Commonly known as:  PROVENTIL HFA;VENTOLIN HFA Inhale 1-2 puffs into the lungs every 6 (six) hours as needed for wheezing or shortness of breath.   levofloxacin 750 MG tablet Commonly known as:  LEVAQUIN Take 1 tablet (750 mg total) by mouth daily.   predniSONE 20 MG tablet Commonly known as:  DELTASONE Take 1 tablet (20 mg total) by mouth daily with breakfast.   PULMICORT 0.5 MG/2ML nebulizer solution Generic drug:  budesonide Inhale 2 mLs (0.5 mg total) into the lungs 2 (two) times daily. What changed:  how much to take  when to take  this  reasons to take this      Follow-up Information    Caryl Ada, DO Follow up in 1 week(s).   Specialty:  Family Medicine Contact information: [address] [place] [address] [phone]            Time coordinating discharge: 35 min  Signed:  Bryden Darden Juanetta Gosling   [facility] 11/15/2015, 9:02 AM

## 2015-11-16 LAB — LEGIONELLA PNEUMOPHILA SEROGP 1 UR AG: L. PNEUMOPHILA SEROGP 1 UR AG: NEGATIVE

## 2015-11-20 LAB — CULTURE, BLOOD (ROUTINE X 2)
CULTURE: NO GROWTH
Culture: NO GROWTH

## 2015-11-27 IMAGING — CR DG CHEST 2V
2 series · 2 of 2 positions shown · non-contrast
Comparison: Radiograph 08/28/2011

CLINICAL DATA: Short of breath, wheezing

EXAM:
CHEST  2 VIEW

[w chest pa]
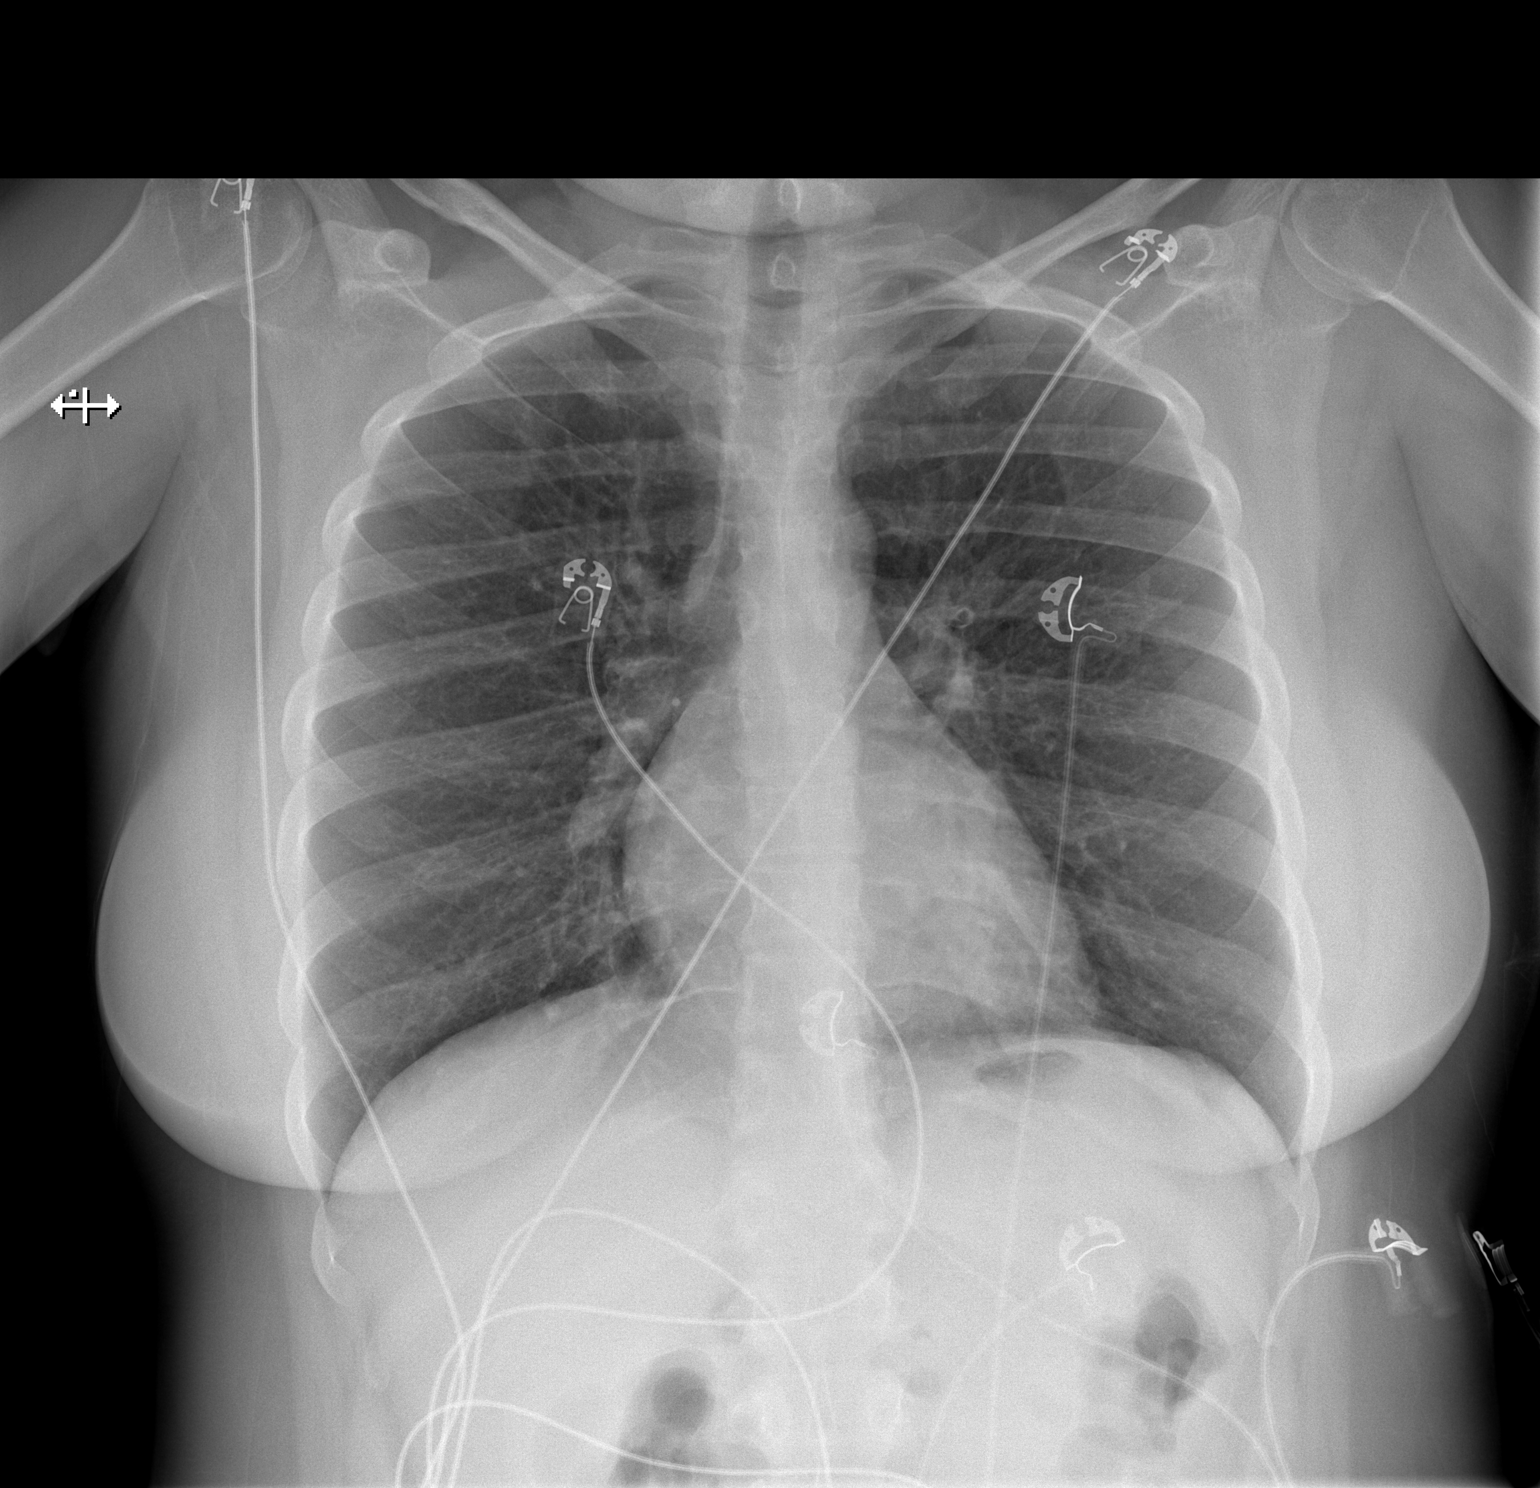

[w chest lat]
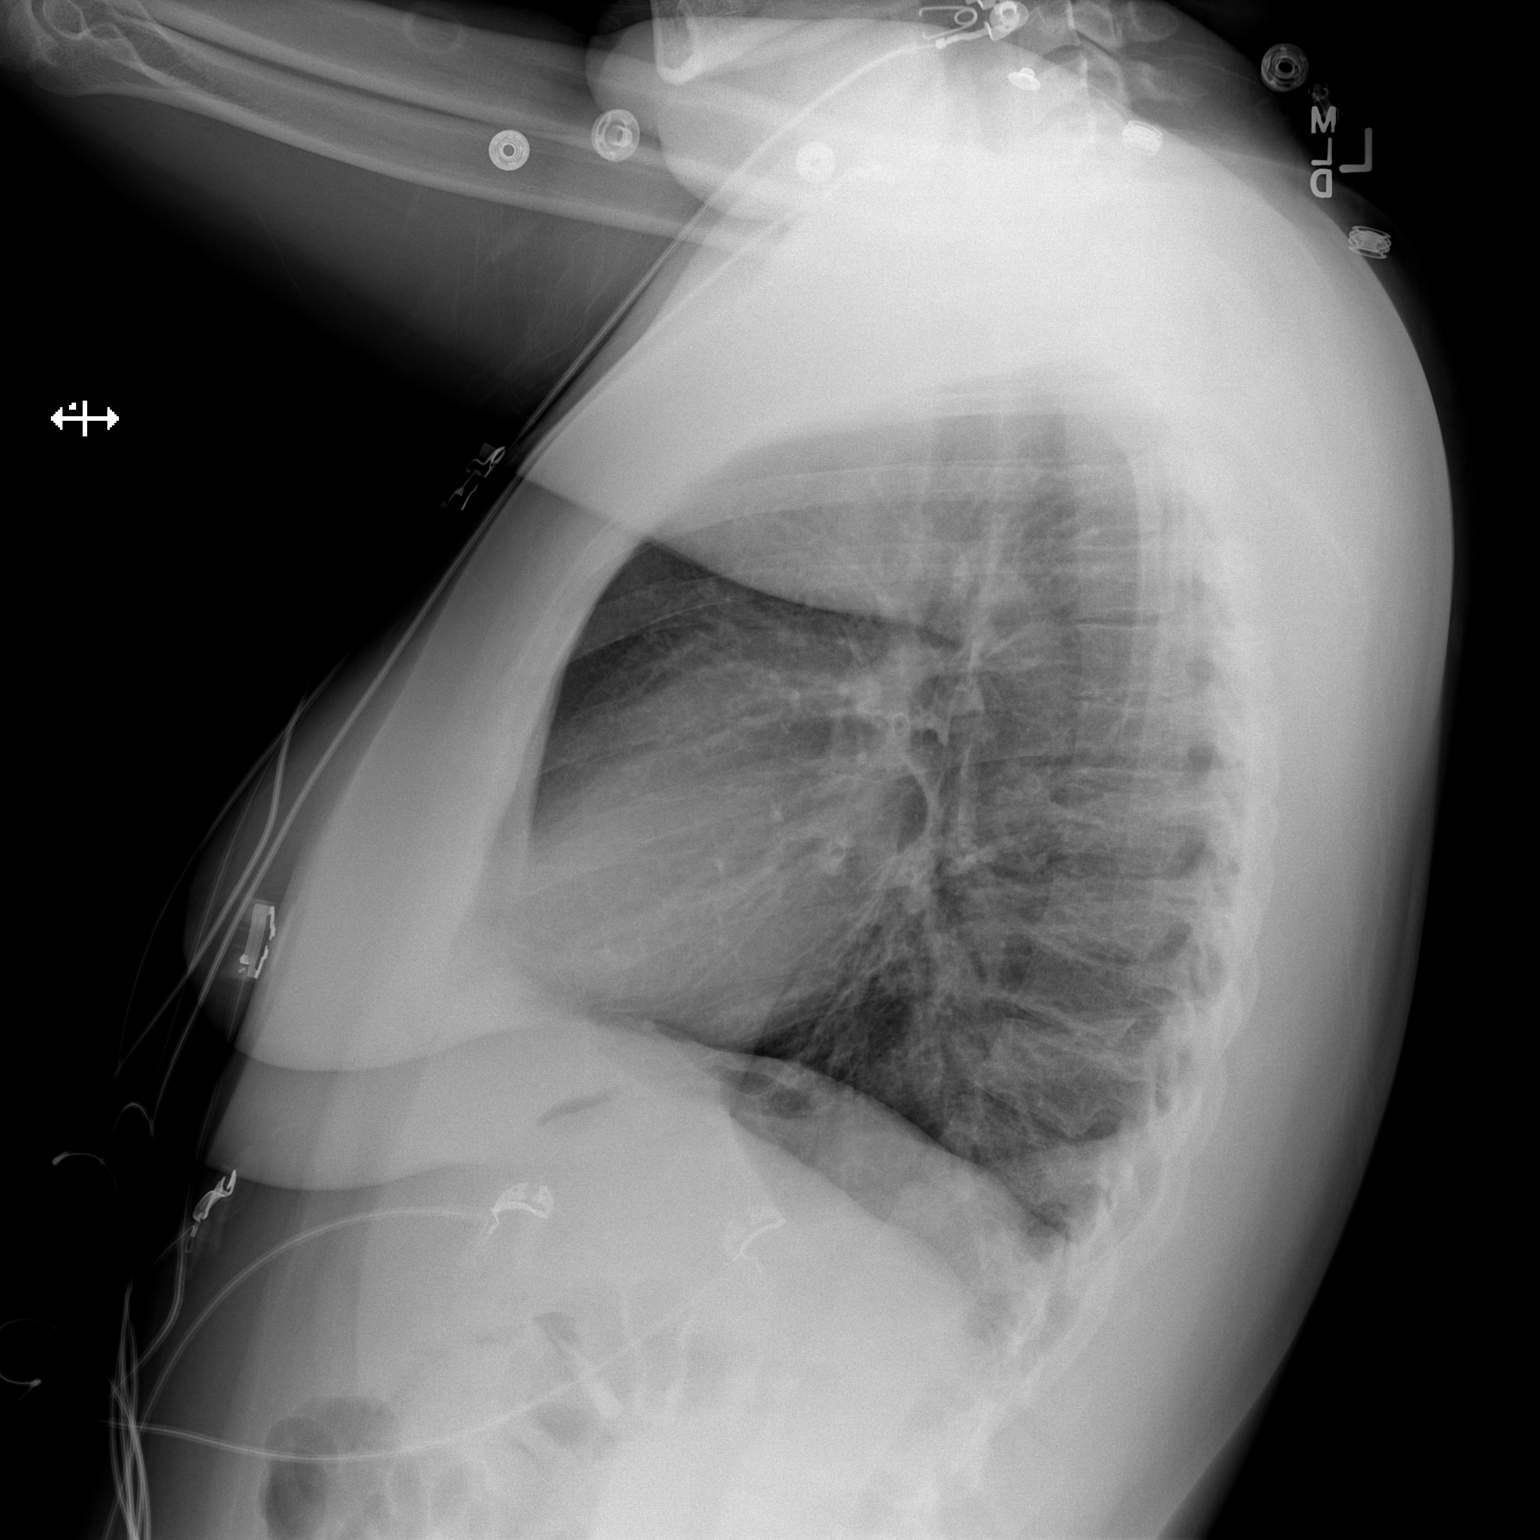

[2 of 2 positions shown; findings below may reference images not displayed]

FINDINGS: Normal mediastinum and cardiac silhouette. Normal pulmonary
vasculature. No evidence of effusion, infiltrate, or pneumothorax.
No acute bony abnormality.
IMPRESSION: Normal chest radiograph.

## 2015-12-03 ENCOUNTER — Other Ambulatory Visit: Payer: Self-pay | Admitting: Family Medicine

## 2015-12-03 NOTE — Telephone Encounter (Signed)
Pt would like a refill on eczema medication (ointment) and Pro Air. Please advise. Thanks! ep

## 2015-12-03 NOTE — Telephone Encounter (Signed)
Patient has not been evaluated in a while. She needs to schedule an appointment to be seen for any additional refills.

## 2015-12-07 ENCOUNTER — Other Ambulatory Visit: Payer: Self-pay | Admitting: Obstetrics and Gynecology

## 2015-12-07 NOTE — Telephone Encounter (Signed)
I filled this medication 4 days ago she should not need another refill so soon. She also needs to follow-up in clinic.

## 2015-12-28 ENCOUNTER — Emergency Department (HOSPITAL_COMMUNITY): Payer: Medicaid Other

## 2015-12-28 ENCOUNTER — Encounter (HOSPITAL_COMMUNITY): Payer: Self-pay | Admitting: *Deleted

## 2015-12-28 ENCOUNTER — Inpatient Hospital Stay (HOSPITAL_COMMUNITY)
Admission: EM | Admit: 2015-12-28 | Discharge: 2015-12-30 | DRG: 203 | Disposition: A | Discharge status: Home / Self Care | Payer: Medicaid Other | Attending: Internal Medicine | Admitting: Internal Medicine

## 2015-12-28 DIAGNOSIS — F17200 Nicotine dependence, unspecified, uncomplicated: Secondary | ICD-10-CM | POA: Diagnosis present

## 2015-12-28 DIAGNOSIS — J45909 Unspecified asthma, uncomplicated: Secondary | ICD-10-CM | POA: Diagnosis present

## 2015-12-28 DIAGNOSIS — K219 Gastro-esophageal reflux disease without esophagitis: Secondary | ICD-10-CM | POA: Diagnosis present

## 2015-12-28 DIAGNOSIS — Z9119 Patient's noncompliance with other medical treatment and regimen: Secondary | ICD-10-CM

## 2015-12-28 DIAGNOSIS — J45901 Unspecified asthma with (acute) exacerbation: Principal | ICD-10-CM | POA: Diagnosis present

## 2015-12-28 DIAGNOSIS — Z833 Family history of diabetes mellitus: Secondary | ICD-10-CM

## 2015-12-28 DIAGNOSIS — Z8701 Personal history of pneumonia (recurrent): Secondary | ICD-10-CM

## 2015-12-28 DIAGNOSIS — E669 Obesity, unspecified: Secondary | ICD-10-CM | POA: Diagnosis present

## 2015-12-28 MED ORDER — ALBUTEROL SULFATE (2.5 MG/3ML) 0.083% IN NEBU
5.0000 mg | INHALATION_SOLUTION | Freq: Once | RESPIRATORY_TRACT | Status: AC
  Administered 2015-12-28: 5 mg via RESPIRATORY_TRACT
  Filled 2015-12-28: qty 6

## 2015-12-28 MED ORDER — IPRATROPIUM BROMIDE 0.02 % IN SOLN
0.5000 mg | Freq: Once | RESPIRATORY_TRACT | Status: AC
  Administered 2015-12-28: 0.5 mg via RESPIRATORY_TRACT
  Filled 2015-12-28: qty 2.5

## 2015-12-28 MED ORDER — ALBUTEROL (5 MG/ML) CONTINUOUS INHALATION SOLN
10.0000 mg/h | INHALATION_SOLUTION | Freq: Once | RESPIRATORY_TRACT | Status: AC
  Administered 2015-12-28: 10 mg/h via RESPIRATORY_TRACT
  Filled 2015-12-28: qty 20

## 2015-12-28 MED ORDER — PREDNISONE 20 MG PO TABS
60.0000 mg | ORAL_TABLET | Freq: Once | ORAL | Status: AC
  Administered 2015-12-28: 60 mg via ORAL
  Filled 2015-12-28: qty 3

## 2015-12-28 NOTE — H&P (Signed)
Gail Walker ZOX:[id number] DOB: 1991/03/02 DOA: 12/28/2015     PCP: Caryl Ada, DO   Outpatient Specialists: none Patient coming from:   home Lives  With family    Chief Complaint: Wheezing and shortness of breath  HPI: Gail Walker is a 25 y.o. female with medical history significant of severe asthma poorly controlled never requiring intubation but repeated admissions in the past, GERD, obesity, pneumonia    Presented with severe wheezing and shortness of breath increased work of breathing. She reports gradual worsening symptoms over past 2 weeks she's been having some cough or directive of yellowish sputum reports wheezing and rhinorrhea. Pulmonary congestion.. inhaler does not seem to help. Patient has history of noncompliance and poor follow-up. Patient and past was started on Pulmicort did not take the medication.   patient continues to smoke. She reports using inhaler every few minutes and wakes up twice a night to use it. She states smoking and cold weather makes it worse.   Regarding pertinent Chronic problems: The patient has been admitted in July for pneumonia   IN ER:  Temp (24hrs), Avg:98 F (36.7 C), Min:98 F (36.7 C), Max:98 F (36.7 C)      Respirations 18 oxygen saturation 97% heart rate 100 blood pressure 1030 85 currently up to 140/75 Patient was given continuous nebulizer treatment but continues to have significant wheezing     Wrist x-ray worrisome for underlying changes of COPD Following Medications were ordered in ER: Medications  albuterol (PROVENTIL) (2.5 MG/3ML) 0.083% nebulizer solution 5 mg (5 mg Nebulization Given 12/28/15 1925)  predniSONE (DELTASONE) tablet 60 mg (60 mg Oral Given 12/28/15 2119)  albuterol (PROVENTIL) (2.5 MG/3ML) 0.083% nebulizer solution 5 mg (5 mg Nebulization Given 12/28/15 2119)  ipratropium (ATROVENT) nebulizer solution 0.5 mg (0.5 mg Nebulization Given 12/28/15 2119)  albuterol (PROVENTIL,VENTOLIN) solution continuous  neb (10 mg/hr Nebulization Given 12/28/15 2229)    Hospitalist was called for admission for asthma exacerbation  Review of Systems:    Pertinent positives include: shortness of breath at rest.dyspnea on exertion,   Constitutional:  No weight loss, night sweats, Fevers, chills, fatigue, weight loss  HEENT:  No headaches, Difficulty swallowing,Tooth/dental problems,Sore throat,  No sneezing, itching, ear ache, nasal congestion, post nasal drip,  Cardio-vascular:  No chest pain, Orthopnea, PND, anasarca, dizziness, palpitations.no Bilateral lower extremity swelling  GI:  No heartburn, indigestion, abdominal pain, nausea, vomiting, diarrhea, change in bowel habits, loss of appetite, melena, blood in stool, hematemesis Resp:    No excess mucus, no productive cough, No non-productive cough, No coughing up of blood.No change in color of mucus.No wheezing. Skin:  no rash or lesions. No jaundice GU:  no dysuria, change in color of urine, no urgency or frequency. No straining to urinate.  No flank pain.  Musculoskeletal:  No joint pain or no joint swelling. No decreased range of motion. No back pain.  Psych:  No change in mood or affect. No depression or anxiety. No memory loss.  Neuro: no localizing neurological complaints, no tingling, no weakness, no double vision, no gait abnormality, no slurred speech, no confusion  As per HPI otherwise 10 point review of systems negative.   Past Medical History: Past Medical History:  Diagnosis Date  . Acne   . Asthma    uses albuterol inhaler daily  . Chlamydia 2012  . GERD (gastroesophageal reflux disease)    during pregnancy - takes zantac   Past Surgical History:  Procedure Laterality Date  . pregnancy termination  Social History:  Ambulatory   independently      reports that she has been smoking.  She has a 2.25 pack-year smoking history. She has never used smokeless tobacco. She reports that she does not drink alcohol or use  drugs.  Allergies:  No Known Allergies     Family History:   Family History  Problem Relation Age of Onset  . Diabetes Father   . Cancer Maternal Aunt   . Anesthesia problems Neg Hx   . Hearing loss Neg Hx   . Stroke Neg Hx   . CAD Neg Hx     Medications: Prior to Admission medications   Medication Sig Start Date End Date Taking? Authorizing Provider  levofloxacin (LEVAQUIN) 750 MG tablet Take 1 tablet (750 mg total) by mouth daily. 11/15/15   Joseph Art, DO  predniSONE (DELTASONE) 20 MG tablet Take 1 tablet (20 mg total) by mouth daily with breakfast. 11/15/15   Joseph Art, DO  PROAIR HFA 108 [phone] Base) MCG/ACT inhaler INHALE 2 PUFFS INTO THE LUNGS EVERY 6 HOURS AS NEEDED FOR SHORTNESS OF BREATH 12/03/15   Pincus Large, DO  PULMICORT 0.5 MG/2ML nebulizer solution Inhale 2 mLs (0.5 mg total) into the lungs 2 (two) times daily. 11/15/15   Joseph Art, DO  triamcinolone ointment (KENALOG) 0.1 % APPLY TO ECZEMA TOPICALLY TWICE DAILY AS NEEDED 12/03/15   Pincus Large, DO    Physical Exam: Patient Vitals for the past 24 hrs:  BP Temp Temp src Pulse Resp SpO2  12/28/15 2229 - - - - - 98 %  12/28/15 1917 103/85 - - 100 18 97 %  12/28/15 1915 - 98 F (36.7 C) Oral - - -    1. General:  in No Acute distress, Able to complete sentences, able to sing ABC's song down to G 2. Psychological: Alert and  Oriented 3. Head/ENT:     Dry Mucous Membranes                          Head Non traumatic, neck supple                          Normal   Dentition 4. SKIN:  decreased Skin turgor,  Skin clean Dry and intact no rash 5. Heart: Regular rate and rhythm no  Murmur, Rub or gallop 6. Lungs: some persistent wheezes no crackles   7. Abdomen: Soft,  non-tender, Non distended 8. Lower extremities: no clubbing, cyanosis, or edema 9. Neurologically Grossly intact, moving all 4 extremities equally   10. MSK: Normal range of motion   body mass index is unknown because there is no  height or weight on file.  Labs on Admission:   Labs on Admission: I have personally reviewed following labs and imaging studies  CBC: No results for input(s): WBC, NEUTROABS, HGB, HCT, MCV, PLT in the last 168 hours. Basic Metabolic Panel: No results for input(s): NA, K, CL, CO2, GLUCOSE, BUN, CREATININE, CALCIUM, MG, PHOS in the last 168 hours. GFR: CrCl cannot be calculated (Unknown ideal weight.). Liver Function Tests: No results for input(s): AST, ALT, ALKPHOS, BILITOT, PROT, ALBUMIN in the last 168 hours. No results for input(s): LIPASE, AMYLASE in the last 168 hours. No results for input(s): AMMONIA in the last 168 hours. Coagulation Profile: No results for input(s): INR, PROTIME in the last 168 hours. Cardiac Enzymes: No results for input(s):  CKTOTAL, CKMB, CKMBINDEX, TROPONINI in the last 168 hours. BNP (last 3 results) No results for input(s): PROBNP in the last 8760 hours. HbA1C: No results for input(s): HGBA1C in the last 72 hours. CBG: No results for input(s): GLUCAP in the last 168 hours. Lipid Profile: No results for input(s): CHOL, HDL, LDLCALC, TRIG, CHOLHDL, LDLDIRECT in the last 72 hours. Thyroid Function Tests: No results for input(s): TSH, T4TOTAL, FREET4, T3FREE, THYROIDAB in the last 72 hours. Anemia Panel: No results for input(s): VITAMINB12, FOLATE, FERRITIN, TIBC, IRON, RETICCTPCT in the last 72 hours. Urine analysis:  Sepsis Labs: @LABRCNTIP (procalcitonin:4,lacticidven:4) )No results found for this or any previous visit (from the past 240 hour(s)).     UA  not ordered  No results found for: HGBA1C  CrCl cannot be calculated (Unknown ideal weight.).  BNP (last 3 results) No results for input(s): PROBNP in the last 8760 hours.   ECG REPORT Not ordered  There were no vitals filed for this visit.   Cultures:    Component Value Date/Time   SDES BLOOD RIGHT HAND 11/14/2015 2147   SPECREQUEST BOTTLES DRAWN AEROBIC AND ANAEROBIC 5CC  11/14/2015 2147   CULT  11/14/2015 2147    NO GROWTH 5 DAYS [facility]    REPTSTATUS 11/20/2015 FINAL 11/14/2015 2147     Radiological Exams on Admission: Dg Chest 2 View  Result Date: 12/28/2015 CLINICAL DATA:  Increasing shortness of breath, cough and wheezing for the past 2 weeks. Smoker. EXAM: CHEST  2 VIEW COMPARISON:  11/14/2015. FINDINGS: Normal sized heart. Clear lungs. Interval diffuse peribronchial thickening. Minimal thoracic spine degenerative changes. The hemidiaphragms are flattened on the lateral view. IMPRESSION: Moderate bronchitic changes with mild underlying changes of COPD. Electronically Signed   By: Beckie SaltsSteven  Reid M.D.   On: 12/28/2015 19:55    Chart has been reviewed   Assessment/Plan  25 y.o. female with medical history significant of severe asthma poorly controlled never requiring intubation but repeated admissions in the past, GERD, obesity, pneumoniaBeing admitted for asthma exacerbation  Present on Admission: . Asthma exacerbation  - Will initiate Steroid taper,  Albuterol PRN, scheduled duoneb, Dulera and Mucinex. Titrate O2 to saturation >90%. Follow patients respiratory status. Appreciate pulmonology consult given severity of underlying disease. Even history of GERD initiate Protonix to see if that would help with reactive airway disease  . TOBACCO USER she nicotine patch tobacco cessation consult   Other plan as per orders.  DVT prophylaxis:   Lovenox     Code Status:  FULL CODE   as per patient   Family Communication:   Family not  at  Bedside    Disposition Plan:     To home once workup is complete and patient is stable                                           Consults called: Pulmonology  Admission status: obs     Level of care    medsurge         I have spent a total of 56 min on this admission  Niani Mourer 12/29/2015, 12:48 AM    [facility]  Pager [phone]   after 2 AM please page floor  coverage PA If 7AM-7PM, please contact the day team taking care of the patient  [email or web address]  Password [email or web address]

## 2015-12-28 NOTE — ED Triage Notes (Signed)
Pt w/ hx of asthma complains of cough and shortness of breath for the past 2 weeks. Pt states she has tried inhaler with no relief.

## 2015-12-28 NOTE — ED Notes (Signed)
Xray called from triage, no answer

## 2015-12-28 NOTE — ED Notes (Signed)
Patient transported to X-ray 

## 2015-12-28 NOTE — ED Provider Notes (Signed)
  Physical Exam  BP 103/85 (BP Location: Left Arm)   Pulse 100   Temp 98 F (36.7 C) (Oral)   Resp 18   LMP 12/12/2015   SpO2 97%   Physical Exam  Constitutional: She appears well-developed and well-nourished. No distress.  HENT:  Head: Normocephalic and atraumatic.  Eyes: Conjunctivae are normal.  Cardiovascular: Normal rate, regular rhythm and normal heart sounds.  Exam reveals no gallop and no friction rub.   No murmur heard. Pulmonary/Chest: Effort normal. No respiratory distress. She has wheezes.  Diffuse inspiratory and expiratory wheezes  Musculoskeletal: Normal range of motion.  Neurological: She is alert. Coordination normal.  Skin: Skin is warm and dry. She is not diaphoretic.  Psychiatric: She has a normal mood and affect. Her behavior is normal.  Nursing note and vitals reviewed.   ED Course  Procedures  MDM  Pt care signed out to me at change of shift from [facility] Laisure, PA-C.   Pt presents with wheezing. 2 duo nebs now on Continuous neb. Reevaluate lung sounds in an hour. Received 60 mg prednisone in the ED. Plan is if wheezing resolves can be d/c with steroids and albuterol inhaler.  Pt states she has been admitted in the past for her asthma and last admit was July 2017.   11:17 PM Pt has been on continuous neb for 45 min. Diffuse inspiratory Wheezes on lung exam. We'll reassess in 20 minutes and wheezes have not improved with admit patient.  11:40PM wheezing has not improved. Will consult the hospitalist team for admission to obs.   Spoke with Dr. Adela Glimpseoutova about admission and she agrees to admit the pt to observation. Case discussed with Dr. Karma GanjaLinker who agrees with the above plan.   Thank you Dr. Adela Glimpseoutova for your time, consult and care of this patient.      Jerre SimonJessica L Luan Maberry, PA 12/29/15 0128    Jerelyn ScottMartha Linker, MD 12/29/15 [phone]

## 2015-12-28 NOTE — ED Notes (Signed)
Breathing treatment complete 

## 2015-12-29 ENCOUNTER — Encounter (HOSPITAL_COMMUNITY): Payer: Self-pay | Admitting: Internal Medicine

## 2015-12-29 DIAGNOSIS — F17200 Nicotine dependence, unspecified, uncomplicated: Secondary | ICD-10-CM

## 2015-12-29 DIAGNOSIS — E669 Obesity, unspecified: Secondary | ICD-10-CM

## 2015-12-29 DIAGNOSIS — J45901 Unspecified asthma with (acute) exacerbation: Principal | ICD-10-CM

## 2015-12-29 LAB — TSH: TSH: 0.289 u[IU]/mL — AB (ref 0.350–4.500)

## 2015-12-29 LAB — CBC
HCT: 37.9 % (ref 36.0–46.0)
HEMOGLOBIN: 12.8 g/dL (ref 12.0–15.0)
MCH: 27.9 pg (ref 26.0–34.0)
MCHC: 33.8 g/dL (ref 30.0–36.0)
MCV: 82.8 fL (ref 78.0–100.0)
PLATELETS: 273 10*3/uL (ref 150–400)
RBC: 4.58 MIL/uL (ref 3.87–5.11)
RDW: 14.1 % (ref 11.5–15.5)
WBC: 5 10*3/uL (ref 4.0–10.5)

## 2015-12-29 LAB — COMPREHENSIVE METABOLIC PANEL
ALBUMIN: 3.8 g/dL (ref 3.5–5.0)
ALT: 11 U/L — AB (ref 14–54)
AST: 16 U/L (ref 15–41)
Alkaline Phosphatase: 47 U/L (ref 38–126)
Anion gap: 9 (ref 5–15)
BUN: 7 mg/dL (ref 6–20)
CHLORIDE: 107 mmol/L (ref 101–111)
CO2: 21 mmol/L — AB (ref 22–32)
CREATININE: 0.76 mg/dL (ref 0.44–1.00)
Calcium: 9.2 mg/dL (ref 8.9–10.3)
GFR calc non Af Amer: >60 mL/min (ref >60)
GLUCOSE: 176 mg/dL — AB (ref 65–99)
Potassium: 4.4 mmol/L (ref 3.5–5.1)
SODIUM: 137 mmol/L (ref 135–145)
Total Bilirubin: 0.1 mg/dL — ABNORMAL LOW (ref 0.3–1.2)
Total Protein: 7 g/dL (ref 6.5–8.1)

## 2015-12-29 LAB — URINALYSIS, ROUTINE W REFLEX MICROSCOPIC
BILIRUBIN URINE: NEGATIVE
Glucose, UA: NEGATIVE mg/dL
HGB URINE DIPSTICK: NEGATIVE
Ketones, ur: NEGATIVE mg/dL
Leukocytes, UA: NEGATIVE
Nitrite: NEGATIVE
Protein, ur: NEGATIVE mg/dL
SPECIFIC GRAVITY, URINE: 1.03 (ref 1.005–1.030)
pH: 6.5 (ref 5.0–8.0)

## 2015-12-29 LAB — CBC WITH DIFFERENTIAL/PLATELET
Basophils Absolute: 0 10*3/uL (ref 0.0–0.1)
Basophils Relative: 1 %
EOS ABS: 0.4 10*3/uL (ref 0.0–0.7)
EOS PCT: 8 %
HCT: 38.2 % (ref 36.0–46.0)
HEMOGLOBIN: 12.7 g/dL (ref 12.0–15.0)
LYMPHS ABS: 0.9 10*3/uL (ref 0.7–4.0)
Lymphocytes Relative: 17 %
MCH: 27.4 pg (ref 26.0–34.0)
MCHC: 33.2 g/dL (ref 30.0–36.0)
MCV: 82.3 fL (ref 78.0–100.0)
MONOS PCT: 4 %
Monocytes Absolute: 0.2 10*3/uL (ref 0.1–1.0)
Neutro Abs: 3.7 10*3/uL (ref 1.7–7.7)
Neutrophils Relative %: 70 %
PLATELETS: 267 10*3/uL (ref 150–400)
RBC: 4.64 MIL/uL (ref 3.87–5.11)
RDW: 14 % (ref 11.5–15.5)
WBC: 5.3 10*3/uL (ref 4.0–10.5)

## 2015-12-29 LAB — BLOOD GAS, VENOUS
ACID-BASE DEFICIT: 0.7 mmol/L (ref 0.0–2.0)
BICARBONATE: 23.4 mmol/L (ref 20.0–28.0)
FIO2: 0.21
O2 Saturation: 72.2 %
PCO2 VEN: 39 mmHg — AB (ref 44.0–60.0)
PH VEN: 7.396 (ref 7.250–7.430)
Patient temperature: 98.6
pO2, Ven: 39.9 mmHg (ref 32.0–45.0)

## 2015-12-29 LAB — BASIC METABOLIC PANEL
Anion gap: 8 (ref 5–15)
BUN: 9 mg/dL (ref 6–20)
CHLORIDE: 105 mmol/L (ref 101–111)
CO2: 23 mmol/L (ref 22–32)
CREATININE: 0.76 mg/dL (ref 0.44–1.00)
Calcium: 9 mg/dL (ref 8.9–10.3)
GFR calc Af Amer: >60 mL/min (ref >60)
GFR calc non Af Amer: >60 mL/min (ref >60)
GLUCOSE: 121 mg/dL — AB (ref 65–99)
Potassium: 3.5 mmol/L (ref 3.5–5.1)
Sodium: 136 mmol/L (ref 135–145)

## 2015-12-29 LAB — MAGNESIUM: Magnesium: 1.6 mg/dL — ABNORMAL LOW (ref 1.7–2.4)

## 2015-12-29 LAB — PHOSPHORUS: Phosphorus: 1.9 mg/dL — ABNORMAL LOW (ref 2.5–4.6)

## 2015-12-29 MED ORDER — GUAIFENESIN ER 600 MG PO TB12
600.0000 mg | ORAL_TABLET | Freq: Two times a day (BID) | ORAL | Status: DC
  Administered 2015-12-29 – 2015-12-30 (×3): 600 mg via ORAL
  Filled 2015-12-29 (×3): qty 1

## 2015-12-29 MED ORDER — FAMOTIDINE 20 MG PO TABS
20.0000 mg | ORAL_TABLET | Freq: Every day | ORAL | Status: DC
  Administered 2015-12-29 – 2015-12-30 (×2): 20 mg via ORAL
  Filled 2015-12-29 (×2): qty 1

## 2015-12-29 MED ORDER — ACETAMINOPHEN 325 MG PO TABS: 650.0000 mg | ORAL_TABLET | Freq: Four times a day (QID) | ORAL | Status: DC | PRN

## 2015-12-29 MED ORDER — METHYLPREDNISOLONE SODIUM SUCC 125 MG IJ SOLR
60.0000 mg | Freq: Two times a day (BID) | INTRAMUSCULAR | Status: DC
  Administered 2015-12-29 – 2015-12-30 (×3): 60 mg via INTRAVENOUS
  Filled 2015-12-29 (×3): qty 2

## 2015-12-29 MED ORDER — ONDANSETRON HCL 4 MG PO TABS: 4.0000 mg | ORAL_TABLET | Freq: Four times a day (QID) | ORAL | Status: DC | PRN

## 2015-12-29 MED ORDER — NICOTINE 21 MG/24HR TD PT24
21.0000 mg | MEDICATED_PATCH | Freq: Every day | TRANSDERMAL | Status: DC
  Administered 2015-12-29 – 2015-12-30 (×2): 21 mg via TRANSDERMAL
  Filled 2015-12-29 (×2): qty 1

## 2015-12-29 MED ORDER — ACETAMINOPHEN 650 MG RE SUPP: 650.0000 mg | Freq: Four times a day (QID) | RECTAL | Status: DC | PRN

## 2015-12-29 MED ORDER — IPRATROPIUM-ALBUTEROL 0.5-2.5 (3) MG/3ML IN SOLN
3.0000 mL | Freq: Four times a day (QID) | RESPIRATORY_TRACT | Status: DC
  Administered 2015-12-29 – 2015-12-30 (×6): 3 mL via RESPIRATORY_TRACT
  Filled 2015-12-29 (×6): qty 3

## 2015-12-29 MED ORDER — MONTELUKAST SODIUM 10 MG PO TABS
10.0000 mg | ORAL_TABLET | Freq: Every day | ORAL | Status: DC
  Administered 2015-12-29: 10 mg via ORAL
  Filled 2015-12-29: qty 1

## 2015-12-29 MED ORDER — ONDANSETRON HCL 4 MG/2ML IJ SOLN: 4.0000 mg | Freq: Four times a day (QID) | INTRAMUSCULAR | Status: DC | PRN

## 2015-12-29 MED ORDER — ALBUTEROL SULFATE (2.5 MG/3ML) 0.083% IN NEBU: 5.0000 mg | INHALATION_SOLUTION | RESPIRATORY_TRACT | Status: DC | PRN

## 2015-12-29 MED ORDER — MOMETASONE FURO-FORMOTEROL FUM 200-5 MCG/ACT IN AERO
2.0000 | INHALATION_SPRAY | Freq: Two times a day (BID) | RESPIRATORY_TRACT | Status: DC
  Administered 2015-12-29 – 2015-12-30 (×3): 2 via RESPIRATORY_TRACT
  Filled 2015-12-29: qty 8.8

## 2015-12-29 MED ORDER — LORATADINE 10 MG PO TABS
10.0000 mg | ORAL_TABLET | Freq: Every day | ORAL | Status: DC
Start: 2015-12-29 — End: 2015-12-30
  Administered 2015-12-29 – 2015-12-30 (×2): 10 mg via ORAL
  Filled 2015-12-29 (×2): qty 1

## 2015-12-29 MED ORDER — ENOXAPARIN SODIUM 40 MG/0.4ML ~~LOC~~ SOLN
40.0000 mg | SUBCUTANEOUS | Status: DC
  Administered 2015-12-29 – 2015-12-30 (×2): 40 mg via SUBCUTANEOUS
  Filled 2015-12-29 (×2): qty 0.4

## 2015-12-29 MED ORDER — SODIUM CHLORIDE 0.9 % IV SOLN
INTRAVENOUS | Status: AC
  Administered 2015-12-29: 02:00:00 via INTRAVENOUS

## 2015-12-29 NOTE — Consult Note (Signed)
PULMONARY / CRITICAL CARE MEDICINE   Name: Gail Walker MRN: [id number] DOB: 02/17/1991    ADMISSION DATE:  12/28/2015 CONSULTATION DATE:  12/29/2015  REFERRING MD:  Dr. Adela Glimpseoutova  CHIEF COMPLAINT:  Short of breath  HISTORY OF PRESENT ILLNESS:   25 yo female, current smoker (since age 25), with a PMH of GERD and Asthma who [facility] with cough and dyspnea x two weeks.  She tried using inhaler w/o relief - reports she has used up to 3 inhalers per month (uses her families inhalers).  She has multiple prior admissions for asthma.  She has hx of GERD also.  She has been bringing up yellow sputum.  She was having wheeze and runny nose.  She has not been compliant with outpt inhaler regimen for some time.  Reports she is "tired" of taking medications.  She was tx for pneumonia in July 2017.  Eosinophils in normal range on CBC.  CXR showed bronchitic changes.  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  She states she does not have difficulty obtaining medications as she has partial medicaid.  Reports seasonal allergies.  Previously was on singulair and advair and did well when she was taking her medications.   PAST MEDICAL HISTORY :  She  has a past medical history of Acne; Asthma; Chlamydia (2012); and GERD (gastroesophageal reflux disease).  PAST SURGICAL HISTORY: She  has a past surgical history that includes pregnancy termination.  No Known Allergies  No current facility-administered medications on file prior to encounter.    Current Outpatient Prescriptions on File Prior to Encounter  Medication Sig  . PROAIR HFA 108 (90 Base) MCG/ACT inhaler INHALE 2 PUFFS INTO THE LUNGS EVERY 6 HOURS AS NEEDED FOR SHORTNESS OF BREATH  . PULMICORT 0.5 MG/2ML nebulizer solution Inhale 2 mLs (0.5 mg total) into the lungs 2 (two) times daily. (Patient taking differently: Inhale 0.5 mg into the lungs 2 (two) times daily as needed (for shortness of breath). )  . triamcinolone ointment  (KENALOG) 0.1 % APPLY TO ECZEMA TOPICALLY TWICE DAILY AS NEEDED (Patient taking differently: APPLY TO ECZEMA TOPICALLY TWICE DAILY AS NEEDED FOR ITCHING)    FAMILY HISTORY:  Her indicated that her mother is alive. She indicated that her father is alive. She indicated that her maternal grandmother is alive. She indicated that her maternal grandfather is alive. She indicated that her paternal grandmother is alive. She indicated that her paternal grandfather is alive. She indicated that the status of her maternal aunt is unknown. She indicated that the status of her neg hx is unknown.    SOCIAL HISTORY: She  reports that she has been smoking.  She has a 2.25 pack-year smoking history. She has never used smokeless tobacco. She reports that she does not drink alcohol or use drugs.  REVIEW OF SYSTEMS:  POSITIVES IN BOLD Gen: Denies fever, chills, weight change, fatigue, night sweats HEENT: Denies blurred vision, double vision, hearing loss, tinnitus, sinus congestion, rhinorrhea, sore throat, neck stiffness, dysphagia PULM: Denies shortness of breath, cough, sputum production, hemoptysis, wheezing CV: Denies chest pain, edema, orthopnea, paroxysmal nocturnal dyspnea, palpitations GI: Denies abdominal pain, nausea, vomiting, diarrhea, hematochezia, melena, constipation, change in bowel habits.  Reports reflux GU: Denies dysuria, hematuria, polyuria, oliguria, urethral discharge Endocrine: Denies hot or cold intolerance, polyuria, polyphagia or appetite change Derm: Denies rash, dry skin, scaling or peeling skin change Heme: Denies easy bruising, bleeding, bleeding gums Neuro: Denies headache, numbness, weakness, slurred speech, loss of  memory or consciousness   SUBJECTIVE:   VITAL SIGNS: BP 132/79 (BP Location: Right Arm)   Pulse 89   Temp 97.9 F (36.6 C) (Oral)   Resp 18   LMP 12/12/2015   SpO2 99%   INTAKE / OUTPUT: No intake/output data recorded.  PHYSICAL EXAMINATION: General:   Obese female in NAD  Neuro:  AAOx4, speech clear, MAE HEENT:  MM pink/moist, unable to appreciate JVD, pupils =R Cardiovascular:  s1s2 rrr, no m/r/g, no peripheral edema  Lungs:  Even/non-labored, lungs bilaterally coarse with wheezing  Abdomen:  Obese/soft, bsx4 active  Musculoskeletal:  No acute deformities  Skin:  Warm/dry, no edema   LABS:  BMET  Recent Labs Lab 12/29/15 0012  NA 136  K 3.5  CL 105  CO2 23  BUN 9  CREATININE 0.76  GLUCOSE 121*    Electrolytes  Recent Labs Lab 12/29/15 0012  CALCIUM 9.0    CBC  Recent Labs Lab 12/29/15 0012 12/29/15 0602  WBC 5.3 5.0  HGB 12.7 12.8  HCT 38.2 37.9  PLT 267 273    Coag's No results for input(s): APTT, INR in the last 168 hours.  Sepsis Markers No results for input(s): LATICACIDVEN, PROCALCITON, O2SATVEN in the last 168 hours.  ABG No results for input(s): PHART, PCO2ART, PO2ART in the last 168 hours.  Liver Enzymes No results for input(s): AST, ALT, ALKPHOS, BILITOT, ALBUMIN in the last 168 hours.  Cardiac Enzymes No results for input(s): TROPONINI, PROBNP in the last 168 hours.  Glucose No results for input(s): GLUCAP in the last 168 hours.  Imaging Dg Chest 2 View  Result Date: 12/28/2015 CLINICAL DATA:  Increasing shortness of breath, cough and wheezing for the past 2 weeks. Smoker. EXAM: CHEST  2 VIEW COMPARISON:  11/14/2015. FINDINGS: Normal sized heart. Clear lungs. Interval diffuse peribronchial thickening. Minimal thoracic spine degenerative changes. The hemidiaphragms are flattened on the lateral view. IMPRESSION: Moderate bronchitic changes with mild underlying changes of COPD. Electronically Signed   By: Beckie Salts M.D.   On: 12/28/2015 19:55     STUDIES:    CULTURES:   ANTIBIOTICS:   SIGNIFICANT EVENTS: 9/11 Admit with asthma exacerbation    DISCUSSION: 25 yo female smoker with cough, dyspnea, wheeze, and rhinorrhea from asthma exacerbation.  Compliance has been issue  with management in the past.  ASSESSMENT / PLAN:  Acute asthma exacerbation - in the setting of medical non-compliance, ongoing tobacco abuse, seasonal allergies and suspected GERD.  Plan: - continue solumedrol 60 mg IV Q12 with taper to prednisone  - continue dulera - scheduled duoneb - resume singulair  - daily claritin  - will need outpatient PFT's - counseled on overuse of bronchodilators and maintenance therapy  Tobacco abuse.  Plan: - nicotine patch - smoking cessation counseling provided   Hx of GERD.  Plan: - might need further assessment about whether this is a trigger for her asthma - pepcid QD   Obesity.  Plan: - weight loss   Canary Brim, NP-C [facility] Pgr: [phone] or if no answer [phone] 12/29/2015, 9:33 AM   Attending note: I have seen and examined the patient with nurse practitioner/resident and agree with the note. History, labs and imaging reviewed.  25 y/o with asthma, GED, non compliant with therapy admitted with asthma exacerbation. Being treated with nebs, steroids, claritin. Feels better today. Reccomend continue current therapy. Add singulair.  Will need follow up in pulmonary clinic as outpatient.   Chilton Greathouse MD   Pulmonary and Critical Care Pager [phone] If no answer or after 3pm call: [phone] 12/29/2015, 1:32 PM

## 2015-12-29 NOTE — Progress Notes (Signed)
PROGRESS NOTE    Gail Walker  OZH:[id number] DOB: 1990-11-27 DOA: 12/28/2015 PCP: Gail Ada, DO   Chief Complaint  Patient presents with  . Asthma  . URI    Brief Narrative:  HPI on 12/28/2015 Dr. Therisa Doyne Gail Walker is a 25 y.o. female with medical history significant of severe asthma poorly controlled never requiring intubation but repeated admissions in the past, GERD, obesity, pneumonia Presented with severe wheezing and shortness of breath increased work of breathing. She reports gradual worsening symptoms over past 2 weeks she's been having some cough or directive of yellowish sputum reports wheezing and rhinorrhea. Pulmonary congestion.. inhaler does not seem to help. Patient has history of noncompliance and poor follow-up. Patient and past was started on Pulmicort did not take the medication. patient continues to smoke. She reports using inhaler every few minutes and wakes up twice a night to use it. She states smoking and cold weather makes it worse.   Assessment & Plan   Asthma Exacerbation -Likely secondary to ongoing tobacco abuse, medical noncompliance, allergies -CXR: moderate bronchitic changes, mild COPD -Pulmonology consulted and appreciated -Continue solumedrol, dulera, nebs, claritin, singulair  -Patient will need outpatient PFTs  Tobacco Abuse -Smoking cessation discussed -Continue nicotine patch  GERD -Continue pepcid  Obesity -patient should follow up with her PCP regarding lifestyle modifications, including diet and exercise.  DVT Prophylaxis  Lovenox  Code Status: Full  Family Communication: none at bedside  Disposition Plan: In observation  Consultants Pulmonology   Procedures  None  Antibiotics   Anti-infectives    None      Subjective:   Gail Walker seen and examined today.  Patient states she continues to have cough with yellow sputum production. Feels breathing has improved.  Admits to still smoking.   Denies chest pain, abdominal pain, nausea, vomiting, diarrhea, constipation, dizziness, headache.  Objective:   Vitals:   12/28/15 2351 12/29/15 0202 12/29/15 0211 12/29/15 0856  BP: 140/75  132/79   Pulse: 98  89   Resp: 18  18   Temp:   97.9 F (36.6 C)   TempSrc:   Oral   SpO2: 99% 99% 99% 98%    Intake/Output Summary (Last 24 hours) at 12/29/15 1230 Last data filed at 12/29/15 0600  Gross per 24 hour  Intake           411.67 ml  Output                0 ml  Net           411.67 ml   There were no vitals filed for this visit.  Exam  General: Well developed, well nourished, NAD, appears stated age  HEENT: NCAT, PERRLA, EOMI, Anicteic Sclera, mucous membranes moist.   Cardiovascular: S1 S2 auscultated, no rubs, murmurs or gallops. Regular rate and rhythm.  Respiratory: Diffuse expiratory wheezing, occ cough  Abdomen: Soft, obese, nontender, nondistended, + bowel sounds  Extremities: warm dry without cyanosis clubbing or edema  Neuro: AAOx3, nonfocal  Skin: Without rashes exudates or nodules  Psych: Normal affect and demeanor with intact judgement and insight   Data Reviewed: I have personally reviewed following labs and imaging studies  CBC:  Recent Labs Lab 12/29/15 0012 12/29/15 0602  WBC 5.3 5.0  NEUTROABS 3.7  --   HGB 12.7 12.8  HCT 38.2 37.9  MCV 82.3 82.8  PLT 267 273   Basic Metabolic Panel:  Recent Labs Lab 12/29/15 0012 12/29/15 0602  NA 136 137  K 3.5 4.4  CL 105 107  CO2 23 21*  GLUCOSE 121* 176*  BUN 9 7  CREATININE 0.76 0.76  CALCIUM 9.0 9.2  MG  --  1.6*  PHOS  --  1.9*   GFR: CrCl cannot be calculated (Unknown ideal weight.). Liver Function Tests:  Recent Labs Lab 12/29/15 0602  AST 16  ALT 11*  ALKPHOS 47  BILITOT 0.1*  PROT 7.0  ALBUMIN 3.8   No results for input(s): LIPASE, AMYLASE in the last 168 hours. No results for input(s): AMMONIA in the last 168 hours. Coagulation Profile: No results for input(s):  INR, PROTIME in the last 168 hours. Cardiac Enzymes: No results for input(s): CKTOTAL, CKMB, CKMBINDEX, TROPONINI in the last 168 hours. BNP (last 3 results) No results for input(s): PROBNP in the last 8760 hours. HbA1C: No results for input(s): HGBA1C in the last 72 hours. CBG: No results for input(s): GLUCAP in the last 168 hours. Lipid Profile: No results for input(s): CHOL, HDL, LDLCALC, TRIG, CHOLHDL, LDLDIRECT in the last 72 hours. Thyroid Function Tests:  Recent Labs  12/29/15 0602  TSH 0.289*   Anemia Panel: No results for input(s): VITAMINB12, FOLATE, FERRITIN, TIBC, IRON, RETICCTPCT in the last 72 hours. Urine analysis:    Component Value Date/Time   COLORURINE YELLOW 12/29/2015 0034   APPEARANCEUR HAZY (A) 12/29/2015 0034   LABSPEC 1.030 12/29/2015 0034   PHURINE 6.5 12/29/2015 0034   GLUCOSEU NEGATIVE 12/29/2015 0034   HGBUR NEGATIVE 12/29/2015 0034   BILIRUBINUR NEGATIVE 12/29/2015 0034   KETONESUR NEGATIVE 12/29/2015 0034   PROTEINUR NEGATIVE 12/29/2015 0034   UROBILINOGEN 1.0 11/24/2014 0403   NITRITE NEGATIVE 12/29/2015 0034   LEUKOCYTESUR NEGATIVE 12/29/2015 0034   Sepsis Labs: @LABRCNTIP (procalcitonin:4,lacticidven:4)  )No results found for this or any previous visit (from the past 240 hour(s)).    Radiology Studies: Dg Chest 2 View  Result Date: 12/28/2015 CLINICAL DATA:  Increasing shortness of breath, cough and wheezing for the past 2 weeks. Smoker. EXAM: CHEST  2 VIEW COMPARISON:  11/14/2015. FINDINGS: Normal sized heart. Clear lungs. Interval diffuse peribronchial thickening. Minimal thoracic spine degenerative changes. The hemidiaphragms are flattened on the lateral view. IMPRESSION: Moderate bronchitic changes with mild underlying changes of COPD. Electronically Signed   By: Beckie SaltsSteven  Reid M.D.   On: 12/28/2015 19:55     Scheduled Meds: . enoxaparin (LOVENOX) injection  40 mg Subcutaneous Q24H  . famotidine  20 mg Oral Daily  . guaiFENesin   600 mg Oral BID  . ipratropium-albuterol  3 mL Nebulization Q6H  . loratadine  10 mg Oral Daily  . methylPREDNISolone (SOLU-MEDROL) injection  60 mg Intravenous Q12H  . mometasone-formoterol  2 puff Inhalation BID  . montelukast  10 mg Oral QHS  . nicotine  21 mg Transdermal Daily   Continuous Infusions:    LOS: 0 days   Time Spent in minutes   30 minutes  Dickson Kostelnik D.O. on 12/29/2015 at 12:30 PM  Between 7am to 7pm - Pager - [phone]  After 7pm go to [email or web address] - password [email or web address]  And look for the night coverage person covering for me after hours  [facility] Office  [phone]

## 2015-12-30 DIAGNOSIS — J4550 Severe persistent asthma, uncomplicated: Secondary | ICD-10-CM

## 2015-12-30 MED ORDER — PREDNISONE 20 MG PO TABS: 40.0000 mg | ORAL_TABLET | Freq: Once | ORAL | Status: DC

## 2015-12-30 MED ORDER — PREDNISONE 10 MG PO TABS: ORAL_TABLET | ORAL | 0 refills | Status: DC

## 2015-12-30 MED ORDER — NICOTINE 21 MG/24HR TD PT24: 21.0000 mg | MEDICATED_PATCH | Freq: Every day | TRANSDERMAL | 0 refills | Status: DC

## 2015-12-30 MED ORDER — PREDNISONE 20 MG PO TABS: 40.0000 mg | ORAL_TABLET | Freq: Every day | ORAL | Status: DC

## 2015-12-30 MED ORDER — MOMETASONE FURO-FORMOTEROL FUM 200-5 MCG/ACT IN AERO: 2.0000 | INHALATION_SPRAY | Freq: Two times a day (BID) | RESPIRATORY_TRACT | 0 refills | Status: DC

## 2015-12-30 MED ORDER — MONTELUKAST SODIUM 10 MG PO TABS
10.0000 mg | ORAL_TABLET | Freq: Every day | ORAL | 0 refills | Status: DC
Start: 2015-12-30 — End: 2018-01-13

## 2015-12-30 MED ORDER — LORATADINE 10 MG PO TABS: 10.0000 mg | ORAL_TABLET | Freq: Every day | ORAL | 0 refills | Status: DC

## 2015-12-30 MED ORDER — ALBUTEROL SULFATE (2.5 MG/3ML) 0.083% IN NEBU: 2.5000 mg | INHALATION_SOLUTION | Freq: Four times a day (QID) | RESPIRATORY_TRACT | 0 refills | Status: DC | PRN

## 2015-12-30 MED ORDER — FAMOTIDINE 20 MG PO TABS: 20.0000 mg | ORAL_TABLET | Freq: Every day | ORAL | 0 refills | Status: DC

## 2015-12-30 NOTE — Progress Notes (Signed)
PULMONARY / CRITICAL CARE MEDICINE   Name: Gail Walker MRN: [id number] DOB: 06/09/1990    ADMISSION DATE:  12/28/2015 CONSULTATION DATE:  12/29/2015  REFERRING MD:  Dr. Adela Glimpseoutova  CHIEF COMPLAINT:  Short of breath   SUBJECTIVE:  Pt requesting to be discharged - states she feels much better this am.  No acute events overnight.   VITAL SIGNS: BP (!) 141/77 (BP Location: Right Arm)   Pulse 92   Temp 98.7 F (37.1 C) (Oral)   Resp 16   LMP 12/12/2015   SpO2 98%   INTAKE / OUTPUT: I/O last 3 completed shifts: In: 651.7 [P.O.:240; I.V.:411.7] Out: -   PHYSICAL EXAMINATION: General:  Obese female in NAD  Neuro:  AAOx4, speech clear, MAE HEENT:  MM pink/moist, unable to appreciate JVD, pupils =R Cardiovascular:  s1s2 rrr, no m/r/g, no peripheral edema  Lungs:  Even/non-labored, lungs bilaterally coarse with wheezing  Abdomen:  Obese/soft, bsx4 active  Musculoskeletal:  No acute deformities  Skin:  Warm/dry, no edema   LABS:  BMET  Recent Labs Lab 12/29/15 0012 12/29/15 0602  NA 136 137  K 3.5 4.4  CL 105 107  CO2 23 21*  BUN 9 7  CREATININE 0.76 0.76  GLUCOSE 121* 176*    Electrolytes  Recent Labs Lab 12/29/15 0012 12/29/15 0602  CALCIUM 9.0 9.2  MG  --  1.6*  PHOS  --  1.9*    CBC  Recent Labs Lab 12/29/15 0012 12/29/15 0602  WBC 5.3 5.0  HGB 12.7 12.8  HCT 38.2 37.9  PLT 267 273   Liver Enzymes  Recent Labs Lab 12/29/15 0602  AST 16  ALT 11*  ALKPHOS 47  BILITOT 0.1*  ALBUMIN 3.8    Imaging No results found.   STUDIES:    CULTURES:   ANTIBIOTICS:   SIGNIFICANT EVENTS: 9/11 Admit with asthma exacerbation    DISCUSSION: 25 yo female smoker with cough, dyspnea, wheeze, and rhinorrhea from asthma exacerbation.  Compliance has been issue with management in the past.  ASSESSMENT / PLAN:  Acute asthma exacerbation - in the setting of medical non-compliance, ongoing tobacco abuse, seasonal allergies and suspected  GERD.  Plan: - transiton to prednisone 40 mg QD with slow taper - PM dose prednisone 40 mg  - continue dulera - scheduled duoneb - resume singulair  - daily claritin  - will need outpatient PFT's - counseled on overuse of bronchodilators and maintenance therapy - would plan to discharge on Dulera (or equivalent), Q6 PRN albuterol, singulair, claritin - pulmonary follow up arranged with Dr. Isaiah SergeMannam, see discharge section  Tobacco abuse.  Plan: - nicotine patch - smoking cessation counseling provided   Hx of GERD.  Plan: - might need further assessment about whether this is a trigger for her asthma - pepcid QD   Obesity.  Plan: - weight loss   Canary BrimBrandi Mehkai Gallo, NP-C [facility] Pgr: [phone] or if no answer [phone] 12/30/2015, 10:44 AM

## 2015-12-30 NOTE — Progress Notes (Addendum)
Patient has opted to leave hospital against medical advice.  Form was signed and is on chart.  Dr. Catha GosselinMikhail is aware.  Patient counseled of the risks of leaving the hospital against medical advice, but stated that she could not afford to miss any more work.  Patient acknowledged that prescriptions were sent electronically to [facility] on [address].  Patient indicated that she would get them filled.  Patient also was advised to quit smoking, and expressed the desire to do so.

## 2015-12-30 NOTE — Progress Notes (Signed)
PROGRESS NOTE    Gail CarinaBrianna Hackley  ZOX:[id number] DOB: 11/15/1990 DOA: 12/28/2015 PCP: Caryl AdaJazma Phelps, DO   Chief Complaint  Patient presents with  . Asthma  . URI    Brief Narrative:  HPI on 12/28/2015 Dr. Therisa DoyneAnastassia Doutova Gail Walker is a 25 y.o. female with medical history significant of severe asthma poorly controlled never requiring intubation but repeated admissions in the past, GERD, obesity, pneumonia Presented with severe wheezing and shortness of breath increased work of breathing. She reports gradual worsening symptoms over past 2 weeks she's been having some cough or directive of yellowish sputum reports wheezing and rhinorrhea. Pulmonary congestion.. inhaler does not seem to help. Patient has history of noncompliance and poor follow-up. Patient and past was started on Pulmicort did not take the medication. patient continues to smoke. She reports using inhaler every few minutes and wakes up twice a night to use it. She states smoking and cold weather makes it worse.   Assessment & Plan   Asthma Exacerbation -Likely secondary to ongoing tobacco abuse, medical noncompliance, allergies -CXR: moderate bronchitic changes, mild COPD -Pulmonology consulted and appreciated -Continue solumedrol, dulera, nebs, claritin, singulair  -Patient will need outpatient PFTs  Tobacco Abuse -Smoking cessation discussed -Continue nicotine patch  GERD -Continue pepcid  Obesity -patient should follow up with her PCP regarding lifestyle modifications, including diet and exercise.  DVT Prophylaxis  Lovenox  Code Status: Full  Family Communication: none at bedside  Disposition Plan: Admitted  Consultants Pulmonology   Procedures  None  Antibiotics   Anti-infectives    None      Subjective:   Gail CarinaBrianna Dack seen and examined today.  Patient feels her breathing has improved.  Was able to rest last night. Inquires about going home.  Denies chest pain, abdominal pain, nausea,  vomiting, diarrhea, constipation, dizziness, headache.  Objective:   Vitals:   12/29/15 1909 12/29/15 2153 12/30/15 0142 12/30/15 1005  BP:  (!) 141/77    Pulse:  (!) 103  92  Resp:  20  16  Temp:  98.7 F (37.1 C)    TempSrc:  Oral    SpO2: 96% 98% 96% 98%   No intake or output data in the 24 hours ending 12/30/15 1249 There were no vitals filed for this visit.  Exam  General: Well developed, well nourished, NAD  HEENT: NCAT,  mucous membranes moist.   Cardiovascular: S1 S2 auscultated, no rubs, murmurs or gallops. Regular rate and rhythm.  Respiratory: Diffuse expiratory wheezing, not improved  Abdomen: Soft, obese, nontender, nondistended, + bowel sounds  Extremities: warm dry without cyanosis clubbing or edema  Neuro: AAOx3, nonfocal  Psych: appropriate   Data Reviewed: I have personally reviewed following labs and imaging studies  CBC:  Recent Labs Lab 12/29/15 0012 12/29/15 0602  WBC 5.3 5.0  NEUTROABS 3.7  --   HGB 12.7 12.8  HCT 38.2 37.9  MCV 82.3 82.8  PLT 267 273   Basic Metabolic Panel:  Recent Labs Lab 12/29/15 0012 12/29/15 0602  NA 136 137  K 3.5 4.4  CL 105 107  CO2 23 21*  GLUCOSE 121* 176*  BUN 9 7  CREATININE 0.76 0.76  CALCIUM 9.0 9.2  MG  --  1.6*  PHOS  --  1.9*   GFR: CrCl cannot be calculated (Unknown ideal weight.). Liver Function Tests:  Recent Labs Lab 12/29/15 0602  AST 16  ALT 11*  ALKPHOS 47  BILITOT 0.1*  PROT 7.0  ALBUMIN 3.8   No  results for input(s): LIPASE, AMYLASE in the last 168 hours. No results for input(s): AMMONIA in the last 168 hours. Coagulation Profile: No results for input(s): INR, PROTIME in the last 168 hours. Cardiac Enzymes: No results for input(s): CKTOTAL, CKMB, CKMBINDEX, TROPONINI in the last 168 hours. BNP (last 3 results) No results for input(s): PROBNP in the last 8760 hours. HbA1C: No results for input(s): HGBA1C in the last 72 hours. CBG: No results for input(s):  GLUCAP in the last 168 hours. Lipid Profile: No results for input(s): CHOL, HDL, LDLCALC, TRIG, CHOLHDL, LDLDIRECT in the last 72 hours. Thyroid Function Tests:  Recent Labs  12/29/15 0602  TSH 0.289*   Anemia Panel: No results for input(s): VITAMINB12, FOLATE, FERRITIN, TIBC, IRON, RETICCTPCT in the last 72 hours. Urine analysis:    Component Value Date/Time   COLORURINE YELLOW 12/29/2015 0034   APPEARANCEUR HAZY (A) 12/29/2015 0034   LABSPEC 1.030 12/29/2015 0034   PHURINE 6.5 12/29/2015 0034   GLUCOSEU NEGATIVE 12/29/2015 0034   HGBUR NEGATIVE 12/29/2015 0034   BILIRUBINUR NEGATIVE 12/29/2015 0034   KETONESUR NEGATIVE 12/29/2015 0034   PROTEINUR NEGATIVE 12/29/2015 0034   UROBILINOGEN 1.0 11/24/2014 0403   NITRITE NEGATIVE 12/29/2015 0034   LEUKOCYTESUR NEGATIVE 12/29/2015 0034   Sepsis Labs: @LABRCNTIP (procalcitonin:4,lacticidven:4)  )No results found for this or any previous visit (from the past 240 hour(s)).    Radiology Studies: Dg Chest 2 View  Result Date: 12/28/2015 CLINICAL DATA:  Increasing shortness of breath, cough and wheezing for the past 2 weeks. Smoker. EXAM: CHEST  2 VIEW COMPARISON:  11/14/2015. FINDINGS: Normal sized heart. Clear lungs. Interval diffuse peribronchial thickening. Minimal thoracic spine degenerative changes. The hemidiaphragms are flattened on the lateral view. IMPRESSION: Moderate bronchitic changes with mild underlying changes of COPD. Electronically Signed   By: Beckie Salts M.D.   On: 12/28/2015 19:55     Scheduled Meds: . enoxaparin (LOVENOX) injection  40 mg Subcutaneous Q24H  . famotidine  20 mg Oral Daily  . guaiFENesin  600 mg Oral BID  . ipratropium-albuterol  3 mL Nebulization Q6H  . loratadine  10 mg Oral Daily  . mometasone-formoterol  2 puff Inhalation BID  . montelukast  10 mg Oral QHS  . nicotine  21 mg Transdermal Daily  . [START ON 12/31/2015] predniSONE  40 mg Oral Q breakfast  . predniSONE  40 mg Oral Once    Continuous Infusions:    LOS: 1 day   Time Spent in minutes   30 minutes  Lesslie Mckeehan D.O. on 12/30/2015 at 12:49 PM  Between 7am to 7pm - Pager - [phone]  After 7pm go to [email or web address] - password [email or web address]  And look for the night coverage person covering for me after hours  [facility] Office  [phone]

## 2015-12-30 NOTE — Discharge Summary (Addendum)
Physician Discharge Summary  Gail Walker ZOX:[id number] DOB: Dec 24, 1990 DOA: 12/28/2015  PCP: Gail Ada, DO  Admit date: 12/28/2015 Discharge date: 12/30/2015  Time spent: 45 minutes  Recommendations for Outpatient Follow-up:  None. Patient opted to leave against medical advice. Advised to stop smoking.  Discharge Diagnoses:  Asthma exacerbation Tobacco abuse GERD Obesity  Discharge Condition: Unstable  Diet recommendation: Low fat  There were no vitals filed for this visit.  History of present illness:  on 12/28/2015 Dr. Quitman Livings Walker a 25 y.o.femalewith medical history significant of severe asthma poorly controlled never requiring intubation but repeated admissionsin the past, GERD, obesity, pneumonia Presented with severe wheezing and shortness of breath increased work of breathing. She reports gradual worsening symptoms over past 2 weeks she's been having some cough or directive of yellowish sputum reports wheezing and rhinorrhea. Pulmonary congestion.. inhaler does not seem to help. Patient has history of noncompliance and poor follow-up. Patient and past was started on Pulmicort did not take themedication. patient continues to smoke. She reports using inhaler every few minutes and wakes up twice a night to use it. She states smoking and cold weather makes it worse.   Hospital Course:  Asthma Exacerbation -Likely secondary to ongoing tobacco abuse, medical noncompliance, allergies -CXR: moderate bronchitic changes, mild COPD -Pulmonology consulted and appreciated -Was placed solumedrol, dulera, nebs, claritin, singulair  -Pulmonology transitioned patient to PO prednisone and will follow up with patient as an outpatient.  -Patient will need outpatient PFTs -Patient opted to leave AMA. Her lungs do not sound any better or improved since previous day. Patient still has very coarse and diffuse wheezing. Will discharge patient with prednisone  taper.  -Explained to patient that she has not even been out of bed yet to see how exertion will affect her breathing.  She states she wants to go home and needs to work tonight.  If she worsens, she will return to the ER.  Tobacco Abuse -Smoking cessation discussed -Continue nicotine patch  GERD -Continue pepcid  Obesity -patient should follow up with her PCP regarding lifestyle modifications, including diet and exercise.  Consultants Pulmonology   Procedures  None   Discharge Exam: Vitals:   12/29/15 2153 12/30/15 1005  BP: (!) 141/77   Pulse: (!) 103 92  Resp: 20 16  Temp: 98.7 F (37.1 C)    Exam  General: Well developed, well nourished, NAD, appears stated age  HEENT: NCAT, mucous membranes moist.   Cardiovascular: S1 S2 auscultated, no murmurs, RRR  Respiratory: Diffuse expiratory wheezing  Abdomen: Soft, obese, nontender, nondistended, + bowel sounds  Extremities: warm dry without cyanosis clubbing or edema  Neuro: AAOx3, nonfocal  Psych: Appropriate  Discharge Instructions  Current Discharge Medication List    START taking these medications   Details  albuterol (PROVENTIL) (2.5 MG/3ML) 0.083% nebulizer solution Take 3 mLs (2.5 mg total) by nebulization every 6 (six) hours as needed for wheezing or shortness of breath. Qty: 75 mL, Refills: 0    famotidine (PEPCID) 20 MG tablet Take 1 tablet (20 mg total) by mouth daily. Qty: 30 tablet, Refills: 0    loratadine (CLARITIN) 10 MG tablet Take 1 tablet (10 mg total) by mouth daily. Qty: 30 tablet, Refills: 0    mometasone-formoterol (DULERA) 200-5 MCG/ACT AERO Inhale 2 puffs into the lungs 2 (two) times daily. Qty: 13 g, Refills: 0    montelukast (SINGULAIR) 10 MG tablet Take 1 tablet (10 mg total) by mouth at bedtime. Qty: 30 tablet, Refills: 0  nicotine (NICODERM CQ - DOSED IN MG/24 HOURS) 21 mg/24hr patch Place 1 patch (21 mg total) onto the skin daily. Qty: 28 patch, Refills: 0      predniSONE (DELTASONE) 10 MG tablet Take 40mg  (4 tabs) x 3 days, then taper to 30mg  (3 tabs) x 3 days, then 20mg  (2 tabs) x 3days, then 10mg  (1 tab) x 3days, then OFF. Qty: 30 tablet, Refills: 0      CONTINUE these medications which have NOT CHANGED   Details  PROAIR HFA 108 (90 Base) MCG/ACT inhaler INHALE 2 PUFFS INTO THE LUNGS EVERY 6 HOURS AS NEEDED FOR SHORTNESS OF BREATH Qty: 8.5 Inhaler, Refills: 0    PULMICORT 0.5 MG/2ML nebulizer solution Inhale 2 mLs (0.5 mg total) into the lungs 2 (two) times daily. Refills: 3    triamcinolone ointment (KENALOG) 0.1 % APPLY TO ECZEMA TOPICALLY TWICE DAILY AS NEEDED Qty: 15 g, Refills: 0       No Known Allergies Follow-up Information    Gail GreathousePraveen Mannam, MD Follow up on 01/18/2016.   Specialty:  Pulmonary Disease Why:  Appt at 3:30 PM - follow up for asthma  Contact information: [address] [place] [address] [phone]            The results of significant diagnostics from this hospitalization (including imaging, microbiology, ancillary and laboratory) are listed below for reference.    Significant Diagnostic Studies: Dg Chest 2 View  Result Date: 12/28/2015 CLINICAL DATA:  Increasing shortness of breath, cough and wheezing for the past 2 weeks. Smoker. EXAM: CHEST  2 VIEW COMPARISON:  11/14/2015. FINDINGS: Normal sized heart. Clear lungs. Interval diffuse peribronchial thickening. Minimal thoracic spine degenerative changes. The hemidiaphragms are flattened on the lateral view. IMPRESSION: Moderate bronchitic changes with mild underlying changes of COPD. Electronically Signed   By: Beckie SaltsSteven  Walker M.D.   On: 12/28/2015 19:55    Microbiology: No results found for this or any previous visit (from the past 240 hour(s)).   Labs: Basic Metabolic Panel:  Recent Labs Lab 12/29/15 0012 12/29/15 0602  NA 136 137  K 3.5 4.4  CL 105 107  CO2 23 21*  GLUCOSE 121* 176*  BUN 9 7  CREATININE 0.76 0.76  CALCIUM 9.0  9.2  MG  --  1.6*  PHOS  --  1.9*   Liver Function Tests:  Recent Labs Lab 12/29/15 0602  AST 16  ALT 11*  ALKPHOS 47  BILITOT 0.1*  PROT 7.0  ALBUMIN 3.8   No results for input(s): LIPASE, AMYLASE in the last 168 hours. No results for input(s): AMMONIA in the last 168 hours. CBC:  Recent Labs Lab 12/29/15 0012 12/29/15 0602  WBC 5.3 5.0  NEUTROABS 3.7  --   HGB 12.7 12.8  HCT 38.2 37.9  MCV 82.3 82.8  PLT 267 273   Cardiac Enzymes: No results for input(s): CKTOTAL, CKMB, CKMBINDEX, TROPONINI in the last 168 hours. BNP: BNP (last 3 results) No results for input(s): BNP in the last 8760 hours.  ProBNP (last 3 results) No results for input(s): PROBNP in the last 8760 hours.  CBG: No results for input(s): GLUCAP in the last 168 hours.     SignedEdsel Petrin:  Stuart Guillen  [facility] 12/30/2015, 12:48 PM

## 2016-01-18 ENCOUNTER — Inpatient Hospital Stay: Payer: Medicaid Other | Admitting: Pulmonary Disease

## 2016-02-22 ENCOUNTER — Emergency Department (HOSPITAL_COMMUNITY)
Admission: EM | Admit: 2016-02-22 | Discharge: 2016-02-22 | Disposition: A | Discharge status: Home / Self Care | Payer: No Typology Code available for payment source | Attending: Dermatology | Admitting: Dermatology

## 2016-02-22 ENCOUNTER — Encounter (HOSPITAL_COMMUNITY): Payer: Self-pay

## 2016-02-22 DIAGNOSIS — R51 Headache: Secondary | ICD-10-CM | POA: Insufficient documentation

## 2016-02-22 DIAGNOSIS — Z5321 Procedure and treatment not carried out due to patient leaving prior to being seen by health care provider: Secondary | ICD-10-CM | POA: Insufficient documentation

## 2016-02-22 DIAGNOSIS — Z79899 Other long term (current) drug therapy: Secondary | ICD-10-CM | POA: Diagnosis not present

## 2016-02-22 DIAGNOSIS — J45909 Unspecified asthma, uncomplicated: Secondary | ICD-10-CM | POA: Diagnosis not present

## 2016-02-22 DIAGNOSIS — F17200 Nicotine dependence, unspecified, uncomplicated: Secondary | ICD-10-CM | POA: Diagnosis not present

## 2016-02-22 DIAGNOSIS — Y92410 Unspecified street and highway as the place of occurrence of the external cause: Secondary | ICD-10-CM | POA: Diagnosis not present

## 2016-02-22 DIAGNOSIS — Y939 Activity, unspecified: Secondary | ICD-10-CM | POA: Diagnosis not present

## 2016-02-22 DIAGNOSIS — Y999 Unspecified external cause status: Secondary | ICD-10-CM | POA: Insufficient documentation

## 2016-02-22 NOTE — ED Triage Notes (Signed)
Pt called from lobby No response
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
## 2016-06-01 ENCOUNTER — Other Ambulatory Visit: Payer: Self-pay | Admitting: Obstetrics and Gynecology

## 2016-06-20 ENCOUNTER — Other Ambulatory Visit: Payer: Self-pay | Admitting: Obstetrics and Gynecology

## 2016-11-07 ENCOUNTER — Emergency Department (HOSPITAL_COMMUNITY): Payer: Self-pay

## 2016-11-07 ENCOUNTER — Emergency Department (HOSPITAL_COMMUNITY)
Admission: EM | Admit: 2016-11-07 | Discharge: 2016-11-07 | Discharge status: Against Medical Advice | Payer: Self-pay | Attending: Emergency Medicine | Admitting: Emergency Medicine

## 2016-11-07 ENCOUNTER — Encounter (HOSPITAL_COMMUNITY): Payer: Self-pay | Admitting: Emergency Medicine

## 2016-11-07 DIAGNOSIS — R062 Wheezing: Secondary | ICD-10-CM | POA: Insufficient documentation

## 2016-11-07 DIAGNOSIS — Z5321 Procedure and treatment not carried out due to patient leaving prior to being seen by health care provider: Secondary | ICD-10-CM | POA: Insufficient documentation

## 2016-11-07 MED ORDER — ALBUTEROL SULFATE (2.5 MG/3ML) 0.083% IN NEBU
5.0000 mg | INHALATION_SOLUTION | Freq: Once | RESPIRATORY_TRACT | Status: AC
  Administered 2016-11-07: 5 mg via RESPIRATORY_TRACT
  Filled 2016-11-07: qty 6

## 2016-11-07 NOTE — ED Notes (Signed)
Pt called from the lobby with no response x3 

## 2016-11-07 NOTE — ED Triage Notes (Signed)
Pt reports having cough and trouble breathing for the last 2 days. Pt reports prior hx of asthma and inhaler not working. Wheezing noted on assessment. Pt also reports coughing white mucus.

## 2016-11-07 NOTE — ED Provider Notes (Signed)
Patient left prior to being seen.   Alveria ApleyCaccavale, Keyundra Fant, PA-C 11/07/16 2214    Nira Connardama, Pedro Eduardo, MD 11/08/16 [phone]

## 2016-11-07 NOTE — ED Notes (Signed)
Pt reports improvement in breathing after albuterol treatment. Minor wheezing noted after treatment that had improved from initial exam.

## 2016-11-08 ENCOUNTER — Encounter (HOSPITAL_COMMUNITY): Payer: Self-pay | Admitting: Emergency Medicine

## 2016-11-08 ENCOUNTER — Emergency Department (HOSPITAL_COMMUNITY)
Admission: EM | Admit: 2016-11-08 | Discharge: 2016-11-08 | Discharge status: Against Medical Advice | Payer: Self-pay | Attending: Emergency Medicine | Admitting: Emergency Medicine

## 2016-11-08 DIAGNOSIS — Z5321 Procedure and treatment not carried out due to patient leaving prior to being seen by health care provider: Secondary | ICD-10-CM | POA: Insufficient documentation

## 2016-11-08 DIAGNOSIS — R062 Wheezing: Secondary | ICD-10-CM | POA: Insufficient documentation

## 2016-11-08 MED ORDER — ALBUTEROL SULFATE (2.5 MG/3ML) 0.083% IN NEBU
5.0000 mg | INHALATION_SOLUTION | Freq: Once | RESPIRATORY_TRACT | Status: AC
  Administered 2016-11-08: 5 mg via RESPIRATORY_TRACT
  Filled 2016-11-08: qty 6

## 2016-11-08 NOTE — ED Notes (Signed)
No response when called from lobby. 

## 2016-11-08 NOTE — ED Triage Notes (Signed)
Pt from home with c/o asthma x 3 days. Pt states she has been using her inhaler at home with minimal relief. Pt states she has been coughing up yellow mucous. Pt is afebrile. Pt reports rib pain only when coughing. Pt has audible wheezes.

## 2016-11-08 NOTE — ED Notes (Signed)
Pt called to room from lobby; no response. 

## 2016-11-08 NOTE — ED Provider Notes (Signed)
Patient left without being seen.   Gail Walker, Gail Lonsway, PA-C 11/08/16 0143    Nira Connardama, Pedro Eduardo, MD 11/08/16 [phone]

## 2016-11-09 ENCOUNTER — Ambulatory Visit (HOSPITAL_COMMUNITY)
Admission: EM | Admit: 2016-11-09 | Discharge: 2016-11-09 | Disposition: A | Discharge status: Home / Self Care | Payer: Self-pay | Attending: Family Medicine | Admitting: Family Medicine

## 2016-11-09 ENCOUNTER — Encounter (HOSPITAL_COMMUNITY): Payer: Self-pay | Admitting: Emergency Medicine

## 2016-11-09 DIAGNOSIS — J4521 Mild intermittent asthma with (acute) exacerbation: Secondary | ICD-10-CM

## 2016-11-09 MED ORDER — PREDNISONE 10 MG (21) PO TBPK: ORAL_TABLET | ORAL | 0 refills | Status: DC

## 2016-11-09 MED ORDER — ALBUTEROL SULFATE (2.5 MG/3ML) 0.083% IN NEBU: 2.5000 mg | INHALATION_SOLUTION | Freq: Four times a day (QID) | RESPIRATORY_TRACT | 12 refills | Status: DC | PRN

## 2016-11-09 NOTE — ED Triage Notes (Signed)
Asthma issues for 4 days .  Patient is not getting any relief from inhaler.  Patient reports coughing, cold sweats, wheezing with ambulation.   Patient did not get seen, left ED without being seen

## 2016-11-12 NOTE — ED Provider Notes (Signed)
  [facility]   [id number] 11/09/16 Arrival Time: 1915  ASSESSMENT & PLAN:  1. Mild intermittent asthma with exacerbation    Meds ordered this encounter  Medications  . albuterol (PROVENTIL) (2.5 MG/3ML) 0.083% nebulizer solution    Sig: Take 3 mLs (2.5 mg total) by nebulization every 6 (six) hours as needed for wheezing or shortness of breath.    Dispense:  75 mL    Refill:  12  . predniSONE (STERAPRED UNI-PAK 21 TAB) 10 MG (21) TBPK tablet    Sig: Taper as directed.    Dispense:  21 tablet    Refill:  0   As she reportedly responds well with prednisone, she will begin tonight. Close observation. ED if acute worsening again. No neb desired in office.  Reviewed expectations re: course of current medical issues. Questions answered. Outlined signs and symptoms indicating need for more acute intervention. Patient verbalized understanding. After Visit Summary given.   SUBJECTIVE:  Gail Walker is a 26 y.o. female who presents with complaint of asthma exacerbation. On/off for the past 4 days. MDI without significant relief. Occasional coughing. Afebrile. No nasal congestion. Reports infrequent exacerbations. Worse with exertion; better with rest.  ROS: As per HPI.   OBJECTIVE:  Vitals:   11/09/16 2006  BP: 113/63  Pulse: 90  Resp: 18  Temp: 98.4 F (36.9 C)  TempSrc: Oral  SpO2: 99%     General appearance: alert; no distress HEENT: mild nasal congestion Neck: supple Lungs: mild expiratory wheezing bilat; able to speak in full sentences Heart: regular rate and rhythm Extremities: no cyanosis or edema; symmetrical with no gross deformities Skin: warm and dry Psychological:  alert and cooperative; normal mood and affect   No Known Allergies  PMHx, SurgHx, SocialHx, Medications, and Allergies were reviewed in the Visit Navigator and updated as appropriate.      Mardella LaymanHagler, Abir Craine, MD 11/12/16 [id number]

## 2016-12-19 ENCOUNTER — Inpatient Hospital Stay (HOSPITAL_COMMUNITY)
Admission: AD | Admit: 2016-12-19 | Discharge: 2016-12-19 | Disposition: A | Discharge status: Home / Self Care | Payer: Self-pay | Source: Ambulatory Visit | Attending: Obstetrics & Gynecology | Admitting: Obstetrics & Gynecology

## 2016-12-19 ENCOUNTER — Encounter (HOSPITAL_COMMUNITY): Payer: Self-pay | Admitting: *Deleted

## 2016-12-19 DIAGNOSIS — O219 Vomiting of pregnancy, unspecified: Secondary | ICD-10-CM

## 2016-12-19 DIAGNOSIS — N912 Amenorrhea, unspecified: Secondary | ICD-10-CM | POA: Insufficient documentation

## 2016-12-19 DIAGNOSIS — R112 Nausea with vomiting, unspecified: Secondary | ICD-10-CM | POA: Insufficient documentation

## 2016-12-19 MED ORDER — METOCLOPRAMIDE HCL 10 MG PO TABS: 10.0000 mg | ORAL_TABLET | Freq: Three times a day (TID) | ORAL | 3 refills | Status: DC

## 2016-12-19 MED ORDER — PROMETHAZINE HCL 25 MG PO TABS: 12.5000 mg | ORAL_TABLET | Freq: Four times a day (QID) | ORAL | 3 refills | Status: DC | PRN

## 2016-12-19 NOTE — MAU Note (Signed)
Pt C/O vomiting for the past 2 days, LMP was July 30, pos HPT 2 days ago.  Denies abd pain or bleeding.  Has vomited once today.

## 2016-12-19 NOTE — Discharge Instructions (Signed)
[facility]  ° ° °[facility]       Phone: [phone] ° °[facility] Phone: [phone] ° °[facility] [place]  Phone: [phone] ° °[facility] [place]  Phone: [phone] ° °[facility] [place]  Phone: [phone] ° °[facility]       Phone: [phone] ° °[facility]    Phone: [phone]  ° °[facility] ([place])    Phone: [phone] ° °[facility]    Phone: [phone] ° °[facility]    Phone: [phone] ° °[facility]    Phone: [phone] ° °[facility]       Phone: [phone]  ° °[facility]  Phone: [phone] ° °[facility]    Phone: [phone] ° °[facility]    Phone: [phone] ° °[facility]      Phone: [phone] ° °[facility]    Phone: [phone] ° °

## 2016-12-19 NOTE — MAU Provider Note (Signed)
  [name] is a 26 y.o. G2P1011 at Unknown who presents to MAU today for pregnancy verification. The patient denies abdominal pain or vaginal bleeding today. She does report daily nausea and vomiting.  BP 128/89 (BP Location: Right Arm)   Pulse (!) 114   Temp 98.5 F (36.9 C) (Oral)   Resp 18   Ht 5\' 5"  (1.651 m)   Wt 238 lb (108 kg)   LMP 11/14/2016   BMI 39.61 kg/m   CONSTITUTIONAL: Well-developed, well-nourished female in no acute distress.  CARDIOVASCULAR: Regular heart rate RESPIRATORY: Normal effort NEUROLOGICAL: Alert and oriented to person, place, and time.  SKIN: Skin is warm and dry. No rash noted. Not diaphoretic. No erythema. No pallor. PSYCH: Normal mood and affect. Normal behavior. Normal judgment and thought content.  MDM Medical Screen Exam Complete  A: Amenorrhea N/V of pregnancy   P: Discharge from MAU Patient advised to follow-up with [facility] for pregnancy confirmation  Monday-Thursday 8am-4pm or Friday 8am-11am Reglan 10 mg TID PRN during the day and Phenergan 25 mg PRN at night, Safe OTC meds list given Patient may return to MAU as needed or if her condition were to change or worsen   Hurshel PartyLeftwich-Kirby, Cecil Vandyke A, CNM  12/19/2016 3:26 PM

## 2016-12-20 ENCOUNTER — Encounter: Payer: Self-pay | Admitting: Family Medicine

## 2016-12-20 ENCOUNTER — Ambulatory Visit (INDEPENDENT_AMBULATORY_CARE_PROVIDER_SITE_OTHER): Payer: Self-pay

## 2016-12-20 DIAGNOSIS — Z3201 Encounter for pregnancy test, result positive: Secondary | ICD-10-CM

## 2016-12-20 LAB — POCT PREGNANCY, URINE: PREG TEST UR: POSITIVE — AB

## 2016-12-20 NOTE — Progress Notes (Signed)
Chart reviewed for nurse visit. Agree with plan of care.   Marny LowensteinWenzel, Julie N, PA-C 12/20/2016 2:44 PM

## 2016-12-20 NOTE — Progress Notes (Signed)
Patient presented to the office for a UPT. UPT positive. Patient stated that her LMP was 11/14/16. Patient is 5w 1d EDD 08/21/2017. Patient stated that she will start prenatal care and prenatal vitamins. Juli

## 2016-12-29 ENCOUNTER — Other Ambulatory Visit (INDEPENDENT_AMBULATORY_CARE_PROVIDER_SITE_OTHER): Payer: Self-pay

## 2016-12-29 ENCOUNTER — Other Ambulatory Visit: Payer: Self-pay

## 2016-12-29 DIAGNOSIS — Z3491 Encounter for supervision of normal pregnancy, unspecified, first trimester: Secondary | ICD-10-CM

## 2016-12-29 LAB — POCT URINALYSIS DIP (MANUAL ENTRY)
Bilirubin, UA: NEGATIVE
Blood, UA: NEGATIVE
GLUCOSE UA: NEGATIVE mg/dL
LEUKOCYTES UA: NEGATIVE
NITRITE UA: NEGATIVE
Protein Ur, POC: NEGATIVE mg/dL
Spec Grav, UA: >=1.03 — AB (ref 1.010–1.025)
UROBILINOGEN UA: 1 U/dL
pH, UA: 6 (ref 5.0–8.0)

## 2016-12-30 LAB — OBSTETRIC PANEL, INCLUDING HIV
Antibody Screen: NEGATIVE
Basophils Absolute: 0 10*3/uL (ref 0.0–0.2)
Basos: 1 %
EOS (ABSOLUTE): 0.3 10*3/uL (ref 0.0–0.4)
EOS: 6 %
HEMATOCRIT: 37.4 % (ref 34.0–46.6)
HEMOGLOBIN: 12.1 g/dL (ref 11.1–15.9)
HIV Screen 4th Generation wRfx: NONREACTIVE
Hepatitis B Surface Ag: NEGATIVE
IMMATURE GRANULOCYTES: 0 %
Immature Grans (Abs): 0 10*3/uL (ref 0.0–0.1)
LYMPHS: 29 %
Lymphocytes Absolute: 1.6 10*3/uL (ref 0.7–3.1)
MCH: 26.8 pg (ref 26.6–33.0)
MCHC: 32.4 g/dL (ref 31.5–35.7)
MCV: 83 fL (ref 79–97)
MONOCYTES: 6 %
MONOS ABS: 0.4 10*3/uL (ref 0.1–0.9)
NEUTROS PCT: 58 %
Neutrophils Absolute: 3.3 10*3/uL (ref 1.4–7.0)
Platelets: 307 10*3/uL (ref 150–379)
RBC: 4.51 x10E6/uL (ref 3.77–5.28)
RDW: 15.1 % (ref 12.3–15.4)
RH TYPE: NEGATIVE
RPR: NONREACTIVE
RUBELLA: 1.43 {index} (ref >0.99)
WBC: 5.6 10*3/uL (ref 3.4–10.8)

## 2016-12-31 LAB — CULTURE, OB URINE

## 2016-12-31 LAB — URINE CULTURE, OB REFLEX

## 2017-01-06 ENCOUNTER — Encounter: Payer: Self-pay | Admitting: Family Medicine

## 2017-08-07 ENCOUNTER — Other Ambulatory Visit: Payer: Self-pay | Admitting: *Deleted

## 2017-08-07 MED ORDER — TRIAMCINOLONE ACETONIDE 0.1 % EX OINT: TOPICAL_OINTMENT | CUTANEOUS | 0 refills | Status: DC

## 2017-08-08 ENCOUNTER — Emergency Department (HOSPITAL_BASED_OUTPATIENT_CLINIC_OR_DEPARTMENT_OTHER)
Admission: EM | Admit: 2017-08-08 | Discharge: 2017-08-08 | Disposition: A | Discharge status: Home / Self Care | Payer: Self-pay | Attending: Emergency Medicine | Admitting: Emergency Medicine

## 2017-08-08 ENCOUNTER — Encounter (HOSPITAL_BASED_OUTPATIENT_CLINIC_OR_DEPARTMENT_OTHER): Payer: Self-pay | Admitting: *Deleted

## 2017-08-08 ENCOUNTER — Other Ambulatory Visit: Payer: Self-pay

## 2017-08-08 DIAGNOSIS — F17210 Nicotine dependence, cigarettes, uncomplicated: Secondary | ICD-10-CM | POA: Insufficient documentation

## 2017-08-08 DIAGNOSIS — K0889 Other specified disorders of teeth and supporting structures: Secondary | ICD-10-CM | POA: Insufficient documentation

## 2017-08-08 DIAGNOSIS — Z79899 Other long term (current) drug therapy: Secondary | ICD-10-CM | POA: Insufficient documentation

## 2017-08-08 DIAGNOSIS — J45909 Unspecified asthma, uncomplicated: Secondary | ICD-10-CM | POA: Insufficient documentation

## 2017-08-08 MED ORDER — AMOXICILLIN-POT CLAVULANATE 875-125 MG PO TABS: 1.0000 | ORAL_TABLET | Freq: Two times a day (BID) | ORAL | 0 refills | Status: AC

## 2017-08-08 NOTE — ED Provider Notes (Signed)
[facility] EMERGENCY DEPARTMENT Provider Note   CSN: [id number] Arrival date & time: 08/08/17  1253     History   Chief Complaint Chief Complaint  Patient presents with  . Dental Pain    HPI Gail Walker is a 27 y.o. female.  HPI   Pt Is a 27 year old female who presents the ED today complaining of left lower dental pain that has been ongoing for several months but worsened over the last several weeks.  Pain is constant in nature and severe.  She has tried taking Tylenol and using Orajel with no relief.  Denies any exacerbating or alleviating factors.  Denies any difficulty swallowing, facial swelling, neck swelling, effusion or fevers, drainage from the area.  States she does not currently have a Education officer, community.  She denies any other systemic symptoms.  Past Medical History:  Diagnosis Date  . Acne   . Asthma    uses albuterol inhaler daily  . Chlamydia 2012  . GERD (gastroesophageal reflux disease)    during pregnancy - takes zantac    Patient Active Problem List   Diagnosis Date Noted  . Asthma exacerbation 12/28/2015  . CAP (community acquired pneumonia) 11/14/2015  . Vaginal discharge 04/27/2012  . Contraception management 04/27/2012  . Depression 11/14/2011  . Acne 04/26/2011  . TOBACCO USER 12/27/2008  . OBESITY, NOS 06/15/2006  . RHINITIS, ALLERGIC 06/15/2006  . Asthma 06/15/2006  . ECZEMA, ATOPIC DERMATITIS 06/15/2006    Past Surgical History:  Procedure Laterality Date  . pregnancy termination       OB History    Gravida  2   Para  1   Term  1   Preterm  0   AB  1   Living  1     SAB  0   TAB  1   Ectopic  0   Multiple  0   Live Births  1            Home Medications    Prior to Admission medications   Medication Sig Start Date End Date Taking? Authorizing Provider  albuterol (PROVENTIL) (2.5 MG/3ML) 0.083% nebulizer solution Take 3 mLs (2.5 mg total) by nebulization every 6 (six) hours as needed for wheezing or  shortness of breath. 11/09/16   Mardella Layman, MD  amoxicillin-clavulanate (AUGMENTIN) 875-125 MG tablet Take 1 tablet by mouth every 12 (twelve) hours for 7 days. 08/08/17 08/15/17  Joycie Aerts S, PA-C  famotidine (PEPCID) 20 MG tablet Take 1 tablet (20 mg total) by mouth daily. 12/31/15   Mikhail, Nita Sells, DO  loratadine (CLARITIN) 10 MG tablet Take 1 tablet (10 mg total) by mouth daily. 12/31/15   Mikhail, Nita Sells, DO  metoCLOPramide (REGLAN) 10 MG tablet Take 1 tablet (10 mg total) by mouth 3 (three) times daily before meals. 12/19/16   Leftwich-Kirby, Wilmer Floor, CNM  mometasone-formoterol (DULERA) 200-5 MCG/ACT AERO Inhale 2 puffs into the lungs 2 (two) times daily. 12/30/15   Mikhail, Nita Sells, DO  montelukast (SINGULAIR) 10 MG tablet Take 1 tablet (10 mg total) by mouth at bedtime. 12/30/15   Mikhail, Nita Sells, DO  nicotine (NICODERM CQ - DOSED IN MG/24 HOURS) 21 mg/24hr patch Place 1 patch (21 mg total) onto the skin daily. 12/31/15   Mikhail, Nita Sells, DO  promethazine (PHENERGAN) 25 MG tablet Take 0.5-1 tablets (12.5-25 mg total) by mouth every 6 (six) hours as needed for nausea. 12/19/16   Leftwich-Kirby, Wilmer Floor, CNM  triamcinolone ointment (KENALOG) 0.1 % APPLY TO ECZEMA TOPICALLY TWICE  DAILY AS NEEDED 08/07/17   Ellwood Dense, DO    Family History Family History  Problem Relation Age of Onset  . Diabetes Father   . Cancer Maternal Aunt   . Anesthesia problems Neg Hx   . Hearing loss Neg Hx   . Stroke Neg Hx   . CAD Neg Hx     Social History Social History   Tobacco Use  . Smoking status: Current Every Day Smoker    Packs/day: 0.25    Years: 3.00    Pack years: 0.75    Types: Cigarettes  . Smokeless tobacco: Never Used  Substance Use Topics  . Alcohol use: No  . Drug use: No     Allergies   Patient has no known allergies.   Review of Systems Review of Systems  Constitutional: Negative for fever.  HENT: Negative for facial swelling, sore throat and trouble swallowing.         Dental pain  Respiratory: Negative for shortness of breath.   Cardiovascular: Negative for chest pain.  Musculoskeletal: Negative for neck pain and neck stiffness.  Neurological: Negative for headaches.     Physical Exam Updated Vital Signs BP 116/78   Pulse 84   Temp 98.1 F (36.7 C) (Oral)   Resp 20   Ht 5\' 4"  (1.626 m)   Wt 104.3 kg (230 lb)   LMP 08/01/2017   SpO2 100%   BMI 39.48 kg/m   Physical Exam  Constitutional: She is oriented to person, place, and time. She appears well-developed and well-nourished. No distress.  HENT:  Head: Normocephalic and atraumatic.  Right Ear: External ear normal.  Left Ear: External ear normal.  Nose: Nose normal.  Mouth/Throat: Oropharynx is clear and moist.  No dental caries identified.  Patient does have tenderness to the skin just above the left lower molars that appears to have an area of broken skin with possible fluid collection that is small.  Not obstructing airway at all.  No tonsillar swelling or exudate.  No oropharyngeal erythema.  Uvula midline.  No evidence of peritonsillar abscess or retropharyngeal abscess.  No swelling beneath the tongue.  No facial or neck swelling.  Bilateral TMs normal.  Eyes: Pupils are equal, round, and reactive to light. Conjunctivae and EOM are normal.  Neck: Normal range of motion. Neck supple.  Cardiovascular: Normal rate, regular rhythm and normal heart sounds.  Pulmonary/Chest: Effort normal and breath sounds normal.  Musculoskeletal: Normal range of motion.  Lymphadenopathy:    She has no cervical adenopathy.  Neurological: She is alert and oriented to person, place, and time. No cranial nerve deficit.  Skin: Skin is warm and dry.  Psychiatric: She has a normal mood and affect.  Nursing note and vitals reviewed.    ED Treatments / Results  Labs (all labs ordered are listed, but only abnormal results are displayed) Labs Reviewed - No data to display  EKG None  Radiology No  results found.  Procedures Procedures (including critical care time)  Medications Ordered in ED Medications - No data to display   Initial Impression / Assessment and Plan / ED Course  I have reviewed the triage vital signs and the nursing notes.  Pertinent labs & imaging results that were available during my care of the patient were reviewed by me and considered in my medical decision making (see chart for details).    Final Clinical Impressions(s) / ED Diagnoses   Final diagnoses:  Pain, dental   Patient with toothache.  No gross abscess.  Exam unconcerning for Ludwig's angina or spread of infection.  Will treat with Augmentin.  Urged patient to follow-up with dentist as well as PCP.  Return precautions were discussed.  Patient understands the plan and reasons to return immediately to the ED.  ED Discharge Orders        Ordered    amoxicillin-clavulanate (AUGMENTIN) 875-125 MG tablet  Every 12 hours     08/08/17 [address]1322       Florentina Marquart, Twiningortni S, PA-C 08/08/17 1323    Benjiman CorePickering, Nathan, MD 08/08/17 1536

## 2017-08-08 NOTE — Discharge Instructions (Addendum)
You were given a prescription for antibiotics. Please take the antibiotic prescription fully.   I have prescribed a new medication for you today. It is important that when you pick the prescription up you discuss the potential interactions of this medication with other medications you are taking, including over the counter medications, with the pharmacists.   This new medication has potential side effects. Be sure to contact your primary care provider or return to the emergency department if you are experiencing new symptoms that you are unable to tolerate after starting the medication. You need to receive medical evaluation immediately if you start to experience blistering of the skin, rash, swelling, or difficulty breathing as these signs could indicate a more serious medication side effect.   Please follow up with your primary care provider within 5-7 days for re-evaluation of your symptoms.  You should also follow-up with a dentist.  Information for local dentist was provided for you in your discharge paperwork.  If you do not have a primary care provider, information for a healthcare clinic has been provided for you to make arrangements for follow up care. Please return to the emergency department for any new or worsening symptoms.

## 2017-08-08 NOTE — ED Triage Notes (Signed)
Dental pain. She feels she has an abscess.

## 2017-08-21 ENCOUNTER — Emergency Department (HOSPITAL_BASED_OUTPATIENT_CLINIC_OR_DEPARTMENT_OTHER): Admission: EM | Admit: 2017-08-21 | Discharge: 2017-08-21 | Discharge status: Against Medical Advice | Payer: Self-pay

## 2017-08-22 ENCOUNTER — Emergency Department (HOSPITAL_BASED_OUTPATIENT_CLINIC_OR_DEPARTMENT_OTHER)
Admission: EM | Admit: 2017-08-22 | Discharge: 2017-08-22 | Disposition: A | Discharge status: Home / Self Care | Payer: Self-pay | Attending: Emergency Medicine | Admitting: Emergency Medicine

## 2017-08-22 ENCOUNTER — Other Ambulatory Visit: Payer: Self-pay

## 2017-08-22 ENCOUNTER — Encounter (HOSPITAL_BASED_OUTPATIENT_CLINIC_OR_DEPARTMENT_OTHER): Payer: Self-pay | Admitting: *Deleted

## 2017-08-22 DIAGNOSIS — Z79899 Other long term (current) drug therapy: Secondary | ICD-10-CM | POA: Insufficient documentation

## 2017-08-22 DIAGNOSIS — J45909 Unspecified asthma, uncomplicated: Secondary | ICD-10-CM | POA: Insufficient documentation

## 2017-08-22 DIAGNOSIS — K0889 Other specified disorders of teeth and supporting structures: Secondary | ICD-10-CM | POA: Insufficient documentation

## 2017-08-22 DIAGNOSIS — F17210 Nicotine dependence, cigarettes, uncomplicated: Secondary | ICD-10-CM | POA: Insufficient documentation

## 2017-08-22 MED ORDER — IBUPROFEN 800 MG PO TABS: 800.0000 mg | ORAL_TABLET | Freq: Three times a day (TID) | ORAL | 0 refills | Status: DC

## 2017-08-22 MED ORDER — MAGIC MOUTHWASH W/LIDOCAINE: 5.0000 mL | Freq: Three times a day (TID) | ORAL | 0 refills | Status: DC | PRN

## 2017-08-22 MED ORDER — IBUPROFEN 800 MG PO TABS
800.0000 mg | ORAL_TABLET | Freq: Once | ORAL | Status: AC
  Administered 2017-08-22: 800 mg via ORAL
  Filled 2017-08-22: qty 1

## 2017-08-22 NOTE — ED Provider Notes (Signed)
[facility] EMERGENCY DEPARTMENT Provider Note   CSN: [id number] Arrival date & time: 08/22/17  0908     History   Chief Complaint Chief Complaint  Patient presents with  . Dental Pain    HPI Gail Walker is a 27 y.o. female with past medical history of asthma presenting with 1 month of lower back last molar "bump" has been sore and now same thing on the right side back of the right lower molar.  Patient denies any dental pain.  She does not have a dentist at this time.  She was prescribed amoxicillin and completed a course this past week.  She denies any fever, chills, nausea, vomiting, facial swelling, difficulty swallowing, drooling difficulty breathing, sore throat or other symptoms.  She has tried Tylenol with modest relief.   HPI  Past Medical History:  Diagnosis Date  . Acne   . Asthma    uses albuterol inhaler daily  . Chlamydia 2012  . GERD (gastroesophageal reflux disease)    during pregnancy - takes zantac    Patient Active Problem List   Diagnosis Date Noted  . Asthma exacerbation 12/28/2015  . CAP (community acquired pneumonia) 11/14/2015  . Vaginal discharge 04/27/2012  . Contraception management 04/27/2012  . Depression 11/14/2011  . Acne 04/26/2011  . TOBACCO USER 12/27/2008  . OBESITY, NOS 06/15/2006  . RHINITIS, ALLERGIC 06/15/2006  . Asthma 06/15/2006  . ECZEMA, ATOPIC DERMATITIS 06/15/2006    Past Surgical History:  Procedure Laterality Date  . pregnancy termination       OB History    Gravida  2   Para  1   Term  1   Preterm  0   AB  1   Living  1     SAB  0   TAB  1   Ectopic  0   Multiple  0   Live Births  1            Home Medications    Prior to Admission medications   Medication Sig Start Date End Date Taking? Authorizing Provider  albuterol (PROVENTIL) (2.5 MG/3ML) 0.083% nebulizer solution Take 3 mLs (2.5 mg total) by nebulization every 6 (six) hours as needed for wheezing or shortness of  breath. 11/09/16  Yes Hagler, Arlys John, MD  famotidine (PEPCID) 20 MG tablet Take 1 tablet (20 mg total) by mouth daily. 12/31/15   Mikhail, Nita Sells, DO  ibuprofen (ADVIL,MOTRIN) 800 MG tablet Take 1 tablet (800 mg total) by mouth 3 (three) times daily. 08/22/17   Georgiana Shore, PA-C  loratadine (CLARITIN) 10 MG tablet Take 1 tablet (10 mg total) by mouth daily. 12/31/15   Edsel Petrin, DO  magic mouthwash w/lidocaine SOLN Take 5 mLs by mouth 3 (three) times daily as needed for mouth pain. 08/22/17   Georgiana Shore, PA-C  metoCLOPramide (REGLAN) 10 MG tablet Take 1 tablet (10 mg total) by mouth 3 (three) times daily before meals. 12/19/16   Leftwich-Kirby, Wilmer Floor, CNM  mometasone-formoterol (DULERA) 200-5 MCG/ACT AERO Inhale 2 puffs into the lungs 2 (two) times daily. 12/30/15   Mikhail, Nita Sells, DO  montelukast (SINGULAIR) 10 MG tablet Take 1 tablet (10 mg total) by mouth at bedtime. 12/30/15   Mikhail, Nita Sells, DO  nicotine (NICODERM CQ - DOSED IN MG/24 HOURS) 21 mg/24hr patch Place 1 patch (21 mg total) onto the skin daily. 12/31/15   Mikhail, Nita Sells, DO  promethazine (PHENERGAN) 25 MG tablet Take 0.5-1 tablets (12.5-25 mg total) by mouth every  6 (six) hours as needed for nausea. 12/19/16   Leftwich-Kirby, Wilmer Floor, CNM  triamcinolone ointment (KENALOG) 0.1 % APPLY TO ECZEMA TOPICALLY TWICE DAILY AS NEEDED 08/07/17   Ellwood Dense, DO    Family History Family History  Problem Relation Age of Onset  . Diabetes Father   . Cancer Maternal Aunt   . Anesthesia problems Neg Hx   . Hearing loss Neg Hx   . Stroke Neg Hx   . CAD Neg Hx     Social History Social History   Tobacco Use  . Smoking status: Current Every Day Smoker    Packs/day: 0.25    Years: 3.00    Pack years: 0.75    Types: Cigarettes  . Smokeless tobacco: Never Used  Substance Use Topics  . Alcohol use: No  . Drug use: No     Allergies   Patient has no known allergies.   Review of Systems Review of Systems    Constitutional: Negative for chills, diaphoresis, fatigue and fever.  HENT: Positive for dental problem. Negative for drooling, facial swelling, sore throat, tinnitus, trouble swallowing and voice change.   Respiratory: Negative for shortness of breath, wheezing and stridor.   Cardiovascular: Negative for chest pain.  Gastrointestinal: Negative for nausea and vomiting.  Musculoskeletal: Negative for myalgias, neck pain and neck stiffness.  Skin: Negative for color change, pallor and rash.  Neurological: Negative for dizziness, light-headedness and headaches.     Physical Exam Updated Vital Signs BP 126/70 (BP Location: Right Arm)   Pulse 68   Temp 97.7 F (36.5 C) (Oral)   Resp 18   Ht  (1.626 m)   Wt 99.8 kg (220 lb)   LMP 08/01/2017 (Exact Date)   SpO2 100%   BMI 37.76 kg/m   Physical Exam  Constitutional: She is oriented to person, place, and time. She appears well-developed and well-nourished. No distress.  Afebrile, nontoxic-appearing, sitting comfortably in bed no acute distress.  HENT:  Head: Normocephalic and atraumatic.  Right Ear: External ear normal.  Left Ear: External ear normal.  Mouth/Throat: Oropharynx is clear and moist. No oropharyngeal exudate.  No gross oral abscess. Uvula is midline, arches are simmetrical and intact. No peritonsilar swelling or exudate. No trismus. Sublingual mucosa is soft and non-tender. Tolerating oral secretions. No concern for ludwig's angina. Patient with apparent crowding of dentition in the posterior lower molar with macerated gray mucous membranes focally.  Otherwise normal mucous membranes.  No evidence of abscess.  No tenderness to percussion of teeth  Eyes: Conjunctivae and EOM are normal. Right eye exhibits no discharge. Left eye exhibits no discharge.  Neck: Normal range of motion. Neck supple.  Cardiovascular: Normal rate, regular rhythm and normal heart sounds.  Pulmonary/Chest: Effort normal. No stridor. No  respiratory distress. She has no wheezes. She has no rales.  Abdominal: She exhibits no distension.  Musculoskeletal: Normal range of motion.  Lymphadenopathy:    She has no cervical adenopathy.  Neurological: She is alert and oriented to person, place, and time.  Skin: Skin is warm and dry. No rash noted. She is not diaphoretic. No erythema. No pallor.  Psychiatric: She has a normal mood and affect. Her behavior is normal.  Nursing note and vitals reviewed.    ED Treatments / Results  Labs (all labs ordered are listed, but only abnormal results are displayed) Labs Reviewed - No data to display  EKG None  Radiology No results found.  Procedures Procedures (including critical care time)  Medications Ordered in ED Medications  ibuprofen (ADVIL,MOTRIN) tablet 800 mg (800 mg Oral Given 08/22/17 1045)     Initial Impression / Assessment and Plan / ED Course  I have reviewed the triage vital signs and the nursing notes.  Pertinent labs & imaging results that were available during my care of the patient were reviewed by me and considered in my medical decision making (see chart for details).    Patient with pain to the back of her lower posterior molars bilaterally.  No gross abscess.  Exam unconcerning for Ludwig's angina or spread of infection. Patient just completed course of antibiotics.  Pain was managed in the ER. Urged patient to follow-up with dentist.    Discharge home with symptomatic relief and close follow-up.  Patient was provided with resources.  Discussed strict return precautions and advised to return to the emergency department if experiencing any new or worsening symptoms. Instructions were understood and patient agreed with discharge plan. Final Clinical Impressions(s) / ED Diagnoses   Final diagnoses:  Pain, dental    ED Discharge Orders        Ordered    ibuprofen (ADVIL,MOTRIN) 800 MG tablet  3 times daily     08/22/17 1033    magic mouthwash  w/lidocaine SOLN  3 times daily PRN     08/22/17 1033       Gregary Cromer 08/22/17 1138    Melene Plan, DO 08/22/17 1552

## 2017-08-22 NOTE — ED Triage Notes (Signed)
Pt says she has a bump on each side of her mouth. Left side has been there for a month. R side bump appeared yesterday.

## 2017-08-22 NOTE — Discharge Instructions (Addendum)
As discussed, use ibuprofen every 8 hours for pain, use cold beverages or ice as needed.  Use the mouthwash as needed.  Stay well-hydrated and follow-up with a dentist from the list provided or any dentist of your choice.  Follow-up with the wellness center to establish care with a primary care provider. Return to the emergency department if symptoms worsen, facial swelling, fever, chills, nausea, vomiting, difficulty swallowing, drooling or other new concerning symptoms in the meantime.

## 2017-08-22 NOTE — ED Notes (Signed)
NAD at this time. Pt is stable and going home.  

## 2017-08-27 ENCOUNTER — Other Ambulatory Visit: Payer: Self-pay

## 2017-08-27 ENCOUNTER — Encounter (HOSPITAL_BASED_OUTPATIENT_CLINIC_OR_DEPARTMENT_OTHER): Payer: Self-pay | Admitting: Emergency Medicine

## 2017-08-27 ENCOUNTER — Emergency Department (HOSPITAL_BASED_OUTPATIENT_CLINIC_OR_DEPARTMENT_OTHER)
Admission: EM | Admit: 2017-08-27 | Discharge: 2017-08-27 | Disposition: A | Discharge status: Home / Self Care | Payer: No Typology Code available for payment source | Attending: Emergency Medicine | Admitting: Emergency Medicine

## 2017-08-27 DIAGNOSIS — Z79899 Other long term (current) drug therapy: Secondary | ICD-10-CM | POA: Insufficient documentation

## 2017-08-27 DIAGNOSIS — J45909 Unspecified asthma, uncomplicated: Secondary | ICD-10-CM | POA: Insufficient documentation

## 2017-08-27 DIAGNOSIS — R51 Headache: Secondary | ICD-10-CM | POA: Insufficient documentation

## 2017-08-27 DIAGNOSIS — Y939 Activity, unspecified: Secondary | ICD-10-CM | POA: Diagnosis not present

## 2017-08-27 DIAGNOSIS — F17210 Nicotine dependence, cigarettes, uncomplicated: Secondary | ICD-10-CM | POA: Diagnosis not present

## 2017-08-27 DIAGNOSIS — Y998 Other external cause status: Secondary | ICD-10-CM | POA: Diagnosis not present

## 2017-08-27 DIAGNOSIS — R519 Headache, unspecified: Secondary | ICD-10-CM

## 2017-08-27 DIAGNOSIS — Y92410 Unspecified street and highway as the place of occurrence of the external cause: Secondary | ICD-10-CM | POA: Diagnosis not present

## 2017-08-27 MED ORDER — IBUPROFEN 800 MG PO TABS: 800.0000 mg | ORAL_TABLET | Freq: Three times a day (TID) | ORAL | 0 refills | Status: DC | PRN
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
## 2017-08-27 NOTE — Discharge Instructions (Signed)
Ibuprofen as needed for pain.  Follow up with your doctor if your symptoms persist longer than a week.  Return to ER for new or worsening symptoms, any additional concerns.

## 2017-09-13 ENCOUNTER — Encounter: Payer: Self-pay | Admitting: Family Medicine

## 2017-09-13 ENCOUNTER — Ambulatory Visit (INDEPENDENT_AMBULATORY_CARE_PROVIDER_SITE_OTHER): Payer: Self-pay | Admitting: Family Medicine

## 2017-09-13 ENCOUNTER — Other Ambulatory Visit: Payer: Self-pay

## 2017-09-13 DIAGNOSIS — M542 Cervicalgia: Secondary | ICD-10-CM

## 2017-09-13 MED ORDER — CYCLOBENZAPRINE HCL 5 MG PO TABS: 5.0000 mg | ORAL_TABLET | Freq: Every day | ORAL | 1 refills | Status: DC

## 2017-09-13 MED ORDER — NAPROXEN 500 MG PO TABS: 500.0000 mg | ORAL_TABLET | Freq: Two times a day (BID) | ORAL | 0 refills | Status: DC
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
## 2017-09-14 ENCOUNTER — Ambulatory Visit: Payer: Self-pay | Admitting: Internal Medicine

## 2017-11-10 ENCOUNTER — Telehealth: Payer: Self-pay | Admitting: Family Medicine

## 2017-11-10 NOTE — Telephone Encounter (Signed)
Called, VM Full - couldn't leave VM. Please assist in setting up an appt to get a pap smear when she calls back

## 2017-12-28 ENCOUNTER — Inpatient Hospital Stay (HOSPITAL_COMMUNITY)
Admission: AD | Admit: 2017-12-28 | Discharge: 2017-12-28 | Disposition: A | Discharge status: Home / Self Care | Payer: Self-pay | Source: Ambulatory Visit | Attending: Obstetrics and Gynecology | Admitting: Obstetrics and Gynecology

## 2017-12-28 NOTE — MAU Note (Signed)
NOT IN LOBBY 

## 2018-01-12 ENCOUNTER — Emergency Department (HOSPITAL_BASED_OUTPATIENT_CLINIC_OR_DEPARTMENT_OTHER)
Admission: EM | Admit: 2018-01-12 | Discharge: 2018-01-13 | Disposition: A | Discharge status: Home / Self Care | Payer: Self-pay | Attending: Emergency Medicine | Admitting: Emergency Medicine

## 2018-01-12 ENCOUNTER — Emergency Department (HOSPITAL_BASED_OUTPATIENT_CLINIC_OR_DEPARTMENT_OTHER): Payer: Self-pay

## 2018-01-12 ENCOUNTER — Encounter (HOSPITAL_BASED_OUTPATIENT_CLINIC_OR_DEPARTMENT_OTHER): Payer: Self-pay | Admitting: Emergency Medicine

## 2018-01-12 ENCOUNTER — Other Ambulatory Visit: Payer: Self-pay

## 2018-01-12 DIAGNOSIS — J45901 Unspecified asthma with (acute) exacerbation: Secondary | ICD-10-CM | POA: Insufficient documentation

## 2018-01-12 DIAGNOSIS — F17210 Nicotine dependence, cigarettes, uncomplicated: Secondary | ICD-10-CM | POA: Insufficient documentation

## 2018-01-12 MED ORDER — ALBUTEROL SULFATE (2.5 MG/3ML) 0.083% IN NEBU
2.5000 mg | INHALATION_SOLUTION | Freq: Once | RESPIRATORY_TRACT | Status: AC
  Administered 2018-01-12: 2.5 mg via RESPIRATORY_TRACT
  Filled 2018-01-12: qty 3

## 2018-01-12 MED ORDER — IPRATROPIUM-ALBUTEROL 0.5-2.5 (3) MG/3ML IN SOLN
3.0000 mL | Freq: Once | RESPIRATORY_TRACT | Status: AC
  Administered 2018-01-12: 3 mL via RESPIRATORY_TRACT
  Filled 2018-01-12: qty 3

## 2018-01-12 MED ORDER — METHYLPREDNISOLONE SODIUM SUCC 125 MG IJ SOLR
125.0000 mg | Freq: Once | INTRAMUSCULAR | Status: AC
  Administered 2018-01-12: 125 mg via INTRAVENOUS
  Filled 2018-01-12: qty 2

## 2018-01-12 MED ORDER — ALBUTEROL (5 MG/ML) CONTINUOUS INHALATION SOLN
10.0000 mg/h | INHALATION_SOLUTION | RESPIRATORY_TRACT | Status: DC
  Administered 2018-01-13: 10 mg/h via RESPIRATORY_TRACT
  Filled 2018-01-12: qty 20

## 2018-01-12 MED ORDER — ALBUTEROL SULFATE (2.5 MG/3ML) 0.083% IN NEBU
2.5000 mg | INHALATION_SOLUTION | Freq: Once | RESPIRATORY_TRACT | Status: DC
  Filled 2018-01-12: qty 3

## 2018-01-12 NOTE — ED Triage Notes (Signed)
Pt c/o SOB for the past 2 days not getting better with rescue inhalers. Pt denies any fever or chills.

## 2018-01-12 NOTE — ED Provider Notes (Signed)
[facility] EMERGENCY DEPARTMENT Provider Note   CSN: [id number] Arrival date & time: 01/12/18  2045     History   Chief Complaint Chief Complaint  Patient presents with  . Shortness of Breath    HPI Gail Walker is a 27 y.o. female.  The history is provided by the patient. No language interpreter was used.  Shortness of Breath  This is a new problem. The average episode lasts 2 days. The problem occurs intermittently.The current episode started yesterday. Associated symptoms include cough and wheezing. Pertinent negatives include no fever. It is unknown what precipitated the problem. She has tried nothing for the symptoms. The treatment provided moderate relief. Associated medical issues include asthma.  Pt reports she used her whole inhaler today without relief.  Pt has a history of asthma   Past Medical History:  Diagnosis Date  . Acne   . Asthma    uses albuterol inhaler daily  . Chlamydia 2012  . GERD (gastroesophageal reflux disease)    during pregnancy - takes zantac    Patient Active Problem List   Diagnosis Date Noted  . Neck pain 09/13/2017  . Asthma exacerbation 12/28/2015  . CAP (community acquired pneumonia) 11/14/2015  . Vaginal discharge 04/27/2012  . Contraception management 04/27/2012  . Depression 11/14/2011  . Acne 04/26/2011  . TOBACCO USER 12/27/2008  . OBESITY, NOS 06/15/2006  . RHINITIS, ALLERGIC 06/15/2006  . Asthma 06/15/2006  . ECZEMA, ATOPIC DERMATITIS 06/15/2006    Past Surgical History:  Procedure Laterality Date  . pregnancy termination       OB History    Gravida  2   Para  1   Term  1   Preterm  0   AB  1   Living  1     SAB  0   TAB  1   Ectopic  0   Multiple  0   Live Births  1            Home Medications    Prior to Admission medications   Medication Sig Start Date End Date Taking? Authorizing Provider  albuterol (PROVENTIL) (2.5 MG/3ML) 0.083% nebulizer solution Take 3 mLs (2.5 mg  total) by nebulization every 6 (six) hours as needed for wheezing or shortness of breath. 11/09/16  Yes Hagler, Arlys John, MD  cyclobenzaprine (FLEXERIL) 5 MG tablet Take 1 tablet (5 mg total) by mouth at bedtime. 09/13/17   Carney Living, MD  famotidine (PEPCID) 20 MG tablet Take 1 tablet (20 mg total) by mouth daily. 12/31/15   Mikhail, Nita Sells, DO  ibuprofen (ADVIL,MOTRIN) 800 MG tablet Take 1 tablet (800 mg total) by mouth every 8 (eight) hours as needed. 08/27/17   Ward, Chase Picket, PA-C  loratadine (CLARITIN) 10 MG tablet Take 1 tablet (10 mg total) by mouth daily. 12/31/15   Edsel Petrin, DO  magic mouthwash w/lidocaine SOLN Take 5 mLs by mouth 3 (three) times daily as needed for mouth pain. 08/22/17   Georgiana Shore, PA-C  metoCLOPramide (REGLAN) 10 MG tablet Take 1 tablet (10 mg total) by mouth 3 (three) times daily before meals. 12/19/16   Leftwich-Kirby, Wilmer Floor, CNM  mometasone-formoterol (DULERA) 200-5 MCG/ACT AERO Inhale 2 puffs into the lungs 2 (two) times daily. 12/30/15   Mikhail, Nita Sells, DO  montelukast (SINGULAIR) 10 MG tablet Take 1 tablet (10 mg total) by mouth at bedtime. 12/30/15   Mikhail, Nita Sells, DO  naproxen (NAPROSYN) 500 MG tablet Take 1 tablet (500 mg total) by  mouth 2 (two) times daily with a meal. 09/13/17   Chambliss, Estill Batten, MD  nicotine (NICODERM CQ - DOSED IN MG/24 HOURS) 21 mg/24hr patch Place 1 patch (21 mg total) onto the skin daily. 12/31/15   Mikhail, Nita Sells, DO  promethazine (PHENERGAN) 25 MG tablet Take 0.5-1 tablets (12.5-25 mg total) by mouth every 6 (six) hours as needed for nausea. 12/19/16   Leftwich-Kirby, Wilmer Floor, CNM  triamcinolone ointment (KENALOG) 0.1 % APPLY TO ECZEMA TOPICALLY TWICE DAILY AS NEEDED 08/07/17   Ellwood Dense, DO    Family History Family History  Problem Relation Age of Onset  . Diabetes Father   . Cancer Maternal Aunt   . Anesthesia problems Neg Hx   . Hearing loss Neg Hx   . Stroke Neg Hx   . CAD Neg Hx     Social  History Social History   Tobacco Use  . Smoking status: Current Every Day Smoker    Packs/day: 0.25    Years: 3.00    Pack years: 0.75    Types: Cigarettes  . Smokeless tobacco: Never Used  Substance Use Topics  . Alcohol use: No  . Drug use: No     Allergies   Patient has no known allergies.   Review of Systems Review of Systems  Constitutional: Negative for fever.  Respiratory: Positive for cough, shortness of breath and wheezing.   All other systems reviewed and are negative.    Physical Exam Updated Vital Signs BP (!) 132/105 (BP Location: Right Arm)   Pulse (!) 102   Temp 98.3 F (36.8 C) (Oral)   Resp (!) 21   Ht 5\' 3"  (1.6 m)   Wt 102.1 kg   SpO2 97%   BMI 39.86 kg/m   Physical Exam  Constitutional: She is oriented to person, place, and time. She appears well-developed and well-nourished.  HENT:  Head: Normocephalic.  Mouth/Throat: Oropharynx is clear and moist.  Eyes: EOM are normal.  Neck: Normal range of motion.  Cardiovascular: Normal rate.  Pulmonary/Chest: Effort normal. She has wheezes in the right upper field, the right middle field, the right lower field, the left upper field, the left middle field and the left lower field.  Abdominal: She exhibits no distension.  Musculoskeletal: Normal range of motion.       Right lower leg: Normal.       Left lower leg: Normal.  Neurological: She is alert and oriented to person, place, and time.  Skin: Skin is warm.  Psychiatric: She has a normal mood and affect.  Nursing note and vitals reviewed.    ED Treatments / Results  Labs (all labs ordered are listed, but only abnormal results are displayed) Labs Reviewed  CBC WITH DIFFERENTIAL/PLATELET  COMPREHENSIVE METABOLIC PANEL    EKG None  Radiology Dg Chest 2 View  Result Date: 01/12/2018 CLINICAL DATA:  Occasional smoker with dyspnea for the past 3 days. EXAM: CHEST - 2 VIEW COMPARISON:  11/07/2016 FINDINGS: The heart size and mediastinal  contours are within normal limits. Stable mild pulmonary hyperinflation without alveolar consolidation or edema. The visualized skeletal structures are unremarkable. IMPRESSION: Mild pulmonary hyperinflation as before. No pulmonary consolidation or edema. Electronically Signed   By: Tollie Eth M.D.   On: 01/12/2018 21:14    Procedures Procedures (including critical care time)  Medications Ordered in ED Medications  albuterol (PROVENTIL) (2.5 MG/3ML) 0.083% nebulizer solution 2.5 mg (2.5 mg Nebulization Not Given 01/12/18 2208)  albuterol (PROVENTIL,VENTOLIN) solution continuous neb (has  no administration in time range)  methylPREDNISolone sodium succinate (SOLU-MEDROL) 125 mg/2 mL injection 125 mg (has no administration in time range)  ipratropium-albuterol (DUONEB) 0.5-2.5 (3) MG/3ML nebulizer solution 3 mL (3 mLs Nebulization Given 01/12/18 2115)  albuterol (PROVENTIL) (2.5 MG/3ML) 0.083% nebulizer solution 2.5 mg (2.5 mg Nebulization Given 01/12/18 2115)  albuterol (PROVENTIL) (2.5 MG/3ML) 0.083% nebulizer solution 2.5 mg (2.5 mg Nebulization Given 01/12/18 2134)     Initial Impression / Assessment and Plan / ED Course  I have reviewed the triage vital signs and the nursing notes.  Pertinent labs & imaging results that were available during my care of the patient were reviewed by me and considered in my medical decision making (see chart for details).     MDM  Pt has had 2 treatments before my evaluation.  Pt started on 1 hour continuous treatment.  Solumedrol Iv.  Pt reexamined and sounds much better after 1 hour treatment.   Pt given inhaler to take home.  rx for prednisone and albuterol neb solution   Final Clinical Impressions(s) / ED Diagnoses   Final diagnoses:  Exacerbation of persistent asthma, unspecified asthma severity    ED Discharge Orders         Ordered    predniSONE (DELTASONE) 10 MG tablet     01/13/18 0103    albuterol (PROVENTIL) (2.5 MG/3ML) 0.083% nebulizer  solution  Every 6 hours PRN     01/13/18 0103        An After Visit Summary was printed and given to the patient.    Elson Areas, PA-C 01/13/18 0103    Molpus, Jonny Ruiz, MD 01/13/18 [phone]

## 2018-01-12 NOTE — Progress Notes (Signed)
Patient stated that she did not want to do another albuterol treatment at this time.  Patient stated that she used all 200 puffs of her albuterol today.

## 2018-01-13 LAB — CBC WITH DIFFERENTIAL/PLATELET
BASOS ABS: 0 10*3/uL (ref 0.0–0.1)
Basophils Relative: 0 %
Eosinophils Absolute: 0.8 10*3/uL — ABNORMAL HIGH (ref 0.0–0.7)
Eosinophils Relative: 11 %
HEMATOCRIT: 35.9 % — AB (ref 36.0–46.0)
Hemoglobin: 12 g/dL (ref 12.0–15.0)
LYMPHS PCT: 36 %
Lymphs Abs: 2.5 10*3/uL (ref 0.7–4.0)
MCH: 26.9 pg (ref 26.0–34.0)
MCHC: 33.4 g/dL (ref 30.0–36.0)
MCV: 80.5 fL (ref 78.0–100.0)
Monocytes Absolute: 0.5 10*3/uL (ref 0.1–1.0)
Monocytes Relative: 8 %
NEUTROS ABS: 3.2 10*3/uL (ref 1.7–7.7)
NEUTROS PCT: 45 %
PLATELETS: 293 10*3/uL (ref 150–400)
RBC: 4.46 MIL/uL (ref 3.87–5.11)
RDW: 14.2 % (ref 11.5–15.5)
WBC: 7 10*3/uL (ref 4.0–10.5)

## 2018-01-13 LAB — COMPREHENSIVE METABOLIC PANEL
ALBUMIN: 3.7 g/dL (ref 3.5–5.0)
ALK PHOS: 47 U/L (ref 38–126)
ALT: 13 U/L (ref 0–44)
ANION GAP: 10 (ref 5–15)
AST: 14 U/L — ABNORMAL LOW (ref 15–41)
BILIRUBIN TOTAL: 0.2 mg/dL — AB (ref 0.3–1.2)
BUN: 13 mg/dL (ref 6–20)
CALCIUM: 8.5 mg/dL — AB (ref 8.9–10.3)
CO2: 24 mmol/L (ref 22–32)
Chloride: 104 mmol/L (ref 98–111)
Creatinine, Ser: 0.99 mg/dL (ref 0.44–1.00)
Glucose, Bld: 108 mg/dL — ABNORMAL HIGH (ref 70–99)
POTASSIUM: 3.5 mmol/L (ref 3.5–5.1)
Sodium: 138 mmol/L (ref 135–145)
TOTAL PROTEIN: 6.8 g/dL (ref 6.5–8.1)

## 2018-01-13 MED ORDER — ALBUTEROL SULFATE (2.5 MG/3ML) 0.083% IN NEBU: 2.5000 mg | INHALATION_SOLUTION | Freq: Four times a day (QID) | RESPIRATORY_TRACT | 3 refills | Status: DC | PRN

## 2018-01-13 MED ORDER — ALBUTEROL SULFATE HFA 108 (90 BASE) MCG/ACT IN AERS
2.0000 | INHALATION_SPRAY | RESPIRATORY_TRACT | Status: DC
  Administered 2018-01-13: 2 via RESPIRATORY_TRACT
  Filled 2018-01-13: qty 6.7

## 2018-01-13 MED ORDER — PREDNISONE 10 MG PO TABS: ORAL_TABLET | ORAL | 0 refills | Status: DC

## 2018-01-13 NOTE — Discharge Instructions (Signed)
See your Physicain for recheck next week.  Return if any problems.

## 2018-02-07 ENCOUNTER — Emergency Department (HOSPITAL_BASED_OUTPATIENT_CLINIC_OR_DEPARTMENT_OTHER)
Admission: EM | Admit: 2018-02-07 | Discharge: 2018-02-07 | Disposition: A | Discharge status: Home / Self Care | Payer: Self-pay | Attending: Emergency Medicine | Admitting: Emergency Medicine

## 2018-02-07 ENCOUNTER — Other Ambulatory Visit: Payer: Self-pay

## 2018-02-07 ENCOUNTER — Encounter (HOSPITAL_BASED_OUTPATIENT_CLINIC_OR_DEPARTMENT_OTHER): Payer: Self-pay | Admitting: Emergency Medicine

## 2018-02-07 ENCOUNTER — Emergency Department (HOSPITAL_BASED_OUTPATIENT_CLINIC_OR_DEPARTMENT_OTHER): Payer: Self-pay

## 2018-02-07 DIAGNOSIS — Z79899 Other long term (current) drug therapy: Secondary | ICD-10-CM | POA: Insufficient documentation

## 2018-02-07 DIAGNOSIS — E876 Hypokalemia: Secondary | ICD-10-CM | POA: Insufficient documentation

## 2018-02-07 DIAGNOSIS — J4541 Moderate persistent asthma with (acute) exacerbation: Secondary | ICD-10-CM | POA: Insufficient documentation

## 2018-02-07 DIAGNOSIS — F17210 Nicotine dependence, cigarettes, uncomplicated: Secondary | ICD-10-CM | POA: Insufficient documentation

## 2018-02-07 LAB — CBC WITH DIFFERENTIAL/PLATELET
Abs Immature Granulocytes: 0.02 10*3/uL (ref 0.00–0.07)
BASOS ABS: 0 10*3/uL (ref 0.0–0.1)
Basophils Relative: 1 %
EOS ABS: 0.6 10*3/uL — AB (ref 0.0–0.5)
EOS PCT: 10 %
HEMATOCRIT: 38.4 % (ref 36.0–46.0)
HEMOGLOBIN: 12.2 g/dL (ref 12.0–15.0)
Immature Granulocytes: 0 %
Lymphocytes Relative: 28 %
Lymphs Abs: 1.7 10*3/uL (ref 0.7–4.0)
MCH: 26.3 pg (ref 26.0–34.0)
MCHC: 31.8 g/dL (ref 30.0–36.0)
MCV: 82.9 fL (ref 80.0–100.0)
Monocytes Absolute: 0.4 10*3/uL (ref 0.1–1.0)
Monocytes Relative: 6 %
NRBC: 0 % (ref 0.0–0.2)
Neutro Abs: 3.4 10*3/uL (ref 1.7–7.7)
Neutrophils Relative %: 55 %
Platelets: 273 10*3/uL (ref 150–400)
RBC: 4.63 MIL/uL (ref 3.87–5.11)
RDW: 13.9 % (ref 11.5–15.5)
WBC: 6.2 10*3/uL (ref 4.0–10.5)

## 2018-02-07 LAB — BASIC METABOLIC PANEL
Anion gap: 8 (ref 5–15)
BUN: 12 mg/dL (ref 6–20)
CHLORIDE: 102 mmol/L (ref 98–111)
CO2: 25 mmol/L (ref 22–32)
Calcium: 8.8 mg/dL — ABNORMAL LOW (ref 8.9–10.3)
Creatinine, Ser: 1.05 mg/dL — ABNORMAL HIGH (ref 0.44–1.00)
GFR calc Af Amer: >60 mL/min (ref >60)
GFR calc non Af Amer: >60 mL/min (ref >60)
Glucose, Bld: 108 mg/dL — ABNORMAL HIGH (ref 70–99)
Potassium: 3.2 mmol/L — ABNORMAL LOW (ref 3.5–5.1)
SODIUM: 135 mmol/L (ref 135–145)

## 2018-02-07 MED ORDER — ALBUTEROL (5 MG/ML) CONTINUOUS INHALATION SOLN
20.0000 mg/h | INHALATION_SOLUTION | Freq: Once | RESPIRATORY_TRACT | Status: AC
  Administered 2018-02-07: 20 mg/h via RESPIRATORY_TRACT
  Filled 2018-02-07: qty 20

## 2018-02-07 MED ORDER — IPRATROPIUM-ALBUTEROL 0.5-2.5 (3) MG/3ML IN SOLN
3.0000 mL | Freq: Once | RESPIRATORY_TRACT | Status: AC
  Administered 2018-02-07: 3 mL via RESPIRATORY_TRACT

## 2018-02-07 MED ORDER — ALBUTEROL (5 MG/ML) CONTINUOUS INHALATION SOLN
20.0000 mg/h | INHALATION_SOLUTION | Freq: Once | RESPIRATORY_TRACT | Status: AC
  Administered 2018-02-07: 20 mg/h via RESPIRATORY_TRACT

## 2018-02-07 MED ORDER — POTASSIUM CHLORIDE CRYS ER 20 MEQ PO TBCR
40.0000 meq | EXTENDED_RELEASE_TABLET | Freq: Once | ORAL | Status: AC
  Administered 2018-02-07: 40 meq via ORAL
  Filled 2018-02-07: qty 2

## 2018-02-07 MED ORDER — ALBUTEROL SULFATE (2.5 MG/3ML) 0.083% IN NEBU
2.5000 mg | INHALATION_SOLUTION | Freq: Once | RESPIRATORY_TRACT | Status: AC
  Administered 2018-02-07: 2.5 mg via RESPIRATORY_TRACT

## 2018-02-07 MED ORDER — PREDNISONE 20 MG PO TABS: ORAL_TABLET | ORAL | 0 refills | Status: DC

## 2018-02-07 MED ORDER — DEXAMETHASONE SODIUM PHOSPHATE 10 MG/ML IJ SOLN
10.0000 mg | Freq: Once | INTRAMUSCULAR | Status: AC
  Administered 2018-02-07: 10 mg via INTRAMUSCULAR
  Filled 2018-02-07: qty 1

## 2018-02-07 MED ORDER — ALBUTEROL SULFATE (2.5 MG/3ML) 0.083% IN NEBU
INHALATION_SOLUTION | RESPIRATORY_TRACT | Status: AC
  Administered 2018-02-07: 2.5 mg via RESPIRATORY_TRACT
  Filled 2018-02-07: qty 3

## 2018-02-07 MED ORDER — IPRATROPIUM-ALBUTEROL 0.5-2.5 (3) MG/3ML IN SOLN
RESPIRATORY_TRACT | Status: AC
  Administered 2018-02-07: 3 mL via RESPIRATORY_TRACT
  Filled 2018-02-07: qty 3

## 2018-02-07 NOTE — ED Provider Notes (Signed)
[facility] EMERGENCY DEPARTMENT Provider Note   CSN: [id number] Arrival date & time: 02/07/18  0841     History   Chief Complaint Chief Complaint  Patient presents with  . Asthma  . Shortness of Breath    HPI Gail Walker is a 27 y.o. female.  Patient with asthma history, allergies up-to-date presents with worsening cough and wheezing for the past 2 to 3 days.  This feels similar to her previous exacerbations.  Recently finished steroids.  She feels related to change in weather.  No cardiac history.  No recent surgeries.     Past Medical History:  Diagnosis Date  . Acne   . Asthma    uses albuterol inhaler daily  . Chlamydia 2012  . GERD (gastroesophageal reflux disease)    during pregnancy - takes zantac    Patient Active Problem List   Diagnosis Date Noted  . Neck pain 09/13/2017  . Asthma exacerbation 12/28/2015  . CAP (community acquired pneumonia) 11/14/2015  . Vaginal discharge 04/27/2012  . Contraception management 04/27/2012  . Depression 11/14/2011  . Acne 04/26/2011  . TOBACCO USER 12/27/2008  . OBESITY, NOS 06/15/2006  . RHINITIS, ALLERGIC 06/15/2006  . Asthma 06/15/2006  . ECZEMA, ATOPIC DERMATITIS 06/15/2006    Past Surgical History:  Procedure Laterality Date  . pregnancy termination       OB History    Gravida  2   Para  1   Term  1   Preterm  0   AB  1   Living  1     SAB  0   TAB  1   Ectopic  0   Multiple  0   Live Births  1            Home Medications    Prior to Admission medications   Medication Sig Start Date End Date Taking? Authorizing Provider  predniSONE (DELTASONE) 10 MG tablet [id number] 1 taper 01/13/18  Yes Cheron Schaumann K, PA-C  albuterol (PROVENTIL) (2.5 MG/3ML) 0.083% nebulizer solution Take 3 mLs (2.5 mg total) by nebulization every 6 (six) hours as needed for wheezing or shortness of breath. 01/13/18   Elson Areas, PA-C  predniSONE (DELTASONE) 20 MG tablet 2 tabs po daily x 4  days 02/08/18   Blane Ohara, MD    Family History Family History  Problem Relation Age of Onset  . Diabetes Father   . Cancer Maternal Aunt   . Anesthesia problems Neg Hx   . Hearing loss Neg Hx   . Stroke Neg Hx   . CAD Neg Hx     Social History Social History   Tobacco Use  . Smoking status: Current Some Day Smoker    Packs/day: 0.25    Years: 3.00    Pack years: 0.75    Types: Cigarettes  . Smokeless tobacco: Never Used  Substance Use Topics  . Alcohol use: No  . Drug use: No     Allergies   Patient has no known allergies.   Review of Systems Review of Systems  Constitutional: Negative for chills and fever.  HENT: Positive for congestion.   Eyes: Negative for visual disturbance.  Respiratory: Positive for cough, shortness of breath and wheezing.   Cardiovascular: Negative for chest pain and leg swelling.  Gastrointestinal: Negative for abdominal pain and vomiting.  Genitourinary: Negative for dysuria and flank pain.  Musculoskeletal: Negative for back pain, neck pain and neck stiffness.  Skin: Negative for rash.  Neurological:  Negative for light-headedness and headaches.     Physical Exam Updated Vital Signs BP 136/81 Comment: continous neb complete  Pulse 91 Comment: continous neb complete  Temp 98.4 F (36.9 C)   Resp 18 Comment: continous neb complete  Ht 5\' 3"  (1.6 m)   Wt 102 kg   LMP 01/17/2018   SpO2 100%   BMI 39.85 kg/m   Physical Exam  Constitutional: She is oriented to person, place, and time. She appears well-developed and well-nourished.  HENT:  Head: Normocephalic and atraumatic.  Eyes: Conjunctivae are normal. Right eye exhibits no discharge. Left eye exhibits no discharge.  Neck: Normal range of motion. Neck supple. No tracheal deviation present.  Cardiovascular: Normal rate and regular rhythm.  Pulmonary/Chest: Tachypnea noted. She has wheezes in the right upper field, the right middle field, the right lower field, the left  upper field, the left middle field and the left lower field.  Abdominal: Soft. She exhibits no distension. There is no tenderness. There is no guarding.  Musculoskeletal: She exhibits no edema.  Neurological: She is alert and oriented to person, place, and time.  Skin: Skin is warm. No rash noted.  Psychiatric: She has a normal mood and affect.  Nursing note and vitals reviewed.    ED Treatments / Results  Labs (all labs ordered are listed, but only abnormal results are displayed) Labs Reviewed  CBC WITH DIFFERENTIAL/PLATELET - Abnormal; Notable for the following components:      Result Value   Eosinophils Absolute 0.6 (*)    All other components within normal limits  BASIC METABOLIC PANEL - Abnormal; Notable for the following components:   Potassium 3.2 (*)    Glucose, Bld 108 (*)    Creatinine, Ser 1.05 (*)    Calcium 8.8 (*)    All other components within normal limits    EKG None  Radiology Dg Chest 2 View  Result Date: 02/07/2018 CLINICAL DATA:  Shortness of breath for 3 days.  Smoker. EXAM: CHEST - 2 VIEW COMPARISON:  01/12/2018 FINDINGS: The heart size and mediastinal contours are within normal limits. Both lungs are clear. The visualized skeletal structures are unremarkable. IMPRESSION: No active cardiopulmonary disease. Electronically Signed   By: Myles Rosenthal M.D.   On: 02/07/2018 11:24    Procedures .Critical Care Performed by: Blane Ohara, MD Authorized by: Blane Ohara, MD   Critical care provider statement:    Critical care time (minutes):  40   Critical care start time:  02/07/2018 10:00 AM   Critical care end time:  02/07/2018 10:40 AM   Critical care time was exclusive of:  Separately billable procedures and treating other patients and teaching time   Critical care was necessary to treat or prevent imminent or life-threatening deterioration of the following conditions:  Respiratory failure   Critical care was time spent personally by me on the  following activities:  Discussions with consultants, evaluation of patient's response to treatment, examination of patient, ordering and performing treatments and interventions, ordering and review of laboratory studies, ordering and review of radiographic studies, pulse oximetry, re-evaluation of patient's condition, obtaining history from patient or surrogate and review of old charts   I assumed direction of critical care for this patient from another provider in my specialty: no     (including critical care time)  Medications Ordered in ED Medications  potassium chloride SA (K-DUR,KLOR-CON) CR tablet 40 mEq (has no administration in time range)  ipratropium-albuterol (DUONEB) 0.5-2.5 (3) MG/3ML nebulizer solution 3 mL (  3 mLs Nebulization Given 02/07/18 0859)  albuterol (PROVENTIL) (2.5 MG/3ML) 0.083% nebulizer solution 2.5 mg (2.5 mg Nebulization Given 02/07/18 0859)  albuterol (PROVENTIL,VENTOLIN) solution continuous neb (20 mg/hr Nebulization Given 02/07/18 0921)  dexamethasone (DECADRON) injection 10 mg (10 mg Intramuscular Given 02/07/18 0926)  albuterol (PROVENTIL,VENTOLIN) solution continuous neb (20 mg/hr Nebulization Given 02/07/18 1109)     Initial Impression / Assessment and Plan / ED Course  I have reviewed the triage vital signs and the nursing notes.  Pertinent labs & imaging results that were available during my care of the patient were reviewed by me and considered in my medical decision making (see chart for details).    Patient presents with worsening shortness of breath and wheezing, clinical concern for asthma with feeling similar and weather changes.  Initial nebulizer minimal improvement.  Continuous nebulizer and steroid intramuscular ordered. Patient assessed after continuous nebulizer mild improvement however still persistent wheezing.  Plan for chest x-ray, blood work, repeat continuous neb.    Patient improved significantly after second continuous neb.  Patient  comfortable with close outpatient follow-up and strict reasons to return.  Mild tachycardia at discharge secondary to significant albuterol.  Oral potassium given for mild hypokalemia secondary to albuterol.  Chest x-ray reviewed no acute findings.  Results and differential diagnosis were discussed with the patient/parent/guardian. Xrays were independently reviewed by myself.  Close follow up outpatient was discussed, comfortable with the plan.   Medications  potassium chloride SA (K-DUR,KLOR-CON) CR tablet 40 mEq (has no administration in time range)  ipratropium-albuterol (DUONEB) 0.5-2.5 (3) MG/3ML nebulizer solution 3 mL (3 mLs Nebulization Given 02/07/18 0859)  albuterol (PROVENTIL) (2.5 MG/3ML) 0.083% nebulizer solution 2.5 mg (2.5 mg Nebulization Given 02/07/18 0859)  albuterol (PROVENTIL,VENTOLIN) solution continuous neb (20 mg/hr Nebulization Given 02/07/18 0921)  dexamethasone (DECADRON) injection 10 mg (10 mg Intramuscular Given 02/07/18 0926)  albuterol (PROVENTIL,VENTOLIN) solution continuous neb (20 mg/hr Nebulization Given 02/07/18 1109)    Vitals:   02/07/18 1110 02/07/18 1130 02/07/18 1200 02/07/18 1206  BP:  115/80 136/81   Pulse:  100 91   Resp:  18 18   Temp:      SpO2: 99% 99% 94% 100%  Weight:      Height:        Final diagnoses:  Moderate persistent asthma with acute exacerbation  Hypokalemia     Final Clinical Impressions(s) / ED Diagnoses   Final diagnoses:  Moderate persistent asthma with acute exacerbation  Hypokalemia    ED Discharge Orders         Ordered    predniSONE (DELTASONE) 20 MG tablet     02/07/18 1356           Blane Ohara, MD 02/07/18 1358

## 2018-02-07 NOTE — ED Triage Notes (Signed)
Pt states her asthma has flared up for the last 3 days.  Productive cough, white sputum.  No known fever.

## 2018-02-07 NOTE — Discharge Instructions (Addendum)
Use albuterol as needed every 3-4 hours.  See a primary doctor on Friday for reassessment.  Return to the ER for worsening shortness of breath, fevers or new concerns.

## 2018-02-12 ENCOUNTER — Encounter (HOSPITAL_COMMUNITY): Payer: Self-pay | Admitting: *Deleted

## 2018-02-12 ENCOUNTER — Other Ambulatory Visit: Payer: Self-pay

## 2018-02-12 ENCOUNTER — Emergency Department (HOSPITAL_COMMUNITY)
Admission: EM | Admit: 2018-02-12 | Discharge: 2018-02-12 | Disposition: A | Discharge status: Home / Self Care | Payer: Self-pay | Attending: Emergency Medicine | Admitting: Emergency Medicine

## 2018-02-12 DIAGNOSIS — J4541 Moderate persistent asthma with (acute) exacerbation: Secondary | ICD-10-CM | POA: Insufficient documentation

## 2018-02-12 DIAGNOSIS — F17210 Nicotine dependence, cigarettes, uncomplicated: Secondary | ICD-10-CM | POA: Insufficient documentation

## 2018-02-12 MED ORDER — ALBUTEROL SULFATE HFA 108 (90 BASE) MCG/ACT IN AERS
2.0000 | INHALATION_SPRAY | Freq: Once | RESPIRATORY_TRACT | Status: AC
  Administered 2018-02-12: 2 via RESPIRATORY_TRACT
  Filled 2018-02-12: qty 6.7

## 2018-02-12 MED ORDER — ALBUTEROL SULFATE (2.5 MG/3ML) 0.083% IN NEBU
5.0000 mg | INHALATION_SOLUTION | Freq: Once | RESPIRATORY_TRACT | Status: AC
  Administered 2018-02-12: 5 mg via RESPIRATORY_TRACT
  Filled 2018-02-12: qty 6

## 2018-02-12 MED ORDER — ALBUTEROL (5 MG/ML) CONTINUOUS INHALATION SOLN
10.0000 mg/h | INHALATION_SOLUTION | RESPIRATORY_TRACT | Status: DC
  Administered 2018-02-12: 10 mg/h via RESPIRATORY_TRACT
  Filled 2018-02-12: qty 20

## 2018-02-12 MED ORDER — IPRATROPIUM BROMIDE 0.02 % IN SOLN
0.5000 mg | Freq: Once | RESPIRATORY_TRACT | Status: AC
  Administered 2018-02-12: 0.5 mg via RESPIRATORY_TRACT
  Filled 2018-02-12: qty 2.5

## 2018-02-12 MED ORDER — METHYLPREDNISOLONE SODIUM SUCC 125 MG IJ SOLR
125.0000 mg | Freq: Once | INTRAMUSCULAR | Status: AC
  Administered 2018-02-12: 125 mg via INTRAVENOUS
  Filled 2018-02-12: qty 2

## 2018-02-12 NOTE — Discharge Instructions (Signed)
Fill your prednisone Rx and take as directed.

## 2018-02-12 NOTE — ED Provider Notes (Signed)
[facility]-EMERGENCY DEPT Provider Note  CSN: [id number] Arrival date & time: 02/12/18 0241  Chief Complaint(s) Asthma  HPI Gail Walker is a 27 y.o. female   The history is provided by the patient.  Asthma  This is a recurrent problem. The current episode started 2 days ago. The problem occurs constantly. The problem has been gradually worsening. Associated symptoms include shortness of breath. Pertinent negatives include no chest pain, no abdominal pain and no headaches. The symptoms are aggravated by coughing. Nothing relieves the symptoms. She has tried nothing for the symptoms.   Was admitted last week for asthma exacerbation. Has not picked up steroids and ran out of her inhaler.  No fevers or chills.   Past Medical History Past Medical History:  Diagnosis Date  . Acne   . Asthma    uses albuterol inhaler daily  . Chlamydia 2012  . GERD (gastroesophageal reflux disease)    during pregnancy - takes zantac   Patient Active Problem List   Diagnosis Date Noted  . Neck pain 09/13/2017  . Asthma exacerbation 12/28/2015  . CAP (community acquired pneumonia) 11/14/2015  . Vaginal discharge 04/27/2012  . Contraception management 04/27/2012  . Depression 11/14/2011  . Acne 04/26/2011  . TOBACCO USER 12/27/2008  . OBESITY, NOS 06/15/2006  . RHINITIS, ALLERGIC 06/15/2006  . Asthma 06/15/2006  . ECZEMA, ATOPIC DERMATITIS 06/15/2006   Home Medication(s) Prior to Admission medications   Medication Sig Start Date End Date Taking? Authorizing Provider  albuterol (PROVENTIL) (2.5 MG/3ML) 0.083% nebulizer solution Take 3 mLs (2.5 mg total) by nebulization every 6 (six) hours as needed for wheezing or shortness of breath. 01/13/18  Yes Cheron Schaumann K, PA-C  predniSONE (DELTASONE) 10 MG tablet 6.5.4.3.2 1 taper Patient not taking: Reported on 02/12/2018 01/13/18   Elson Areas, PA-C  predniSONE (DELTASONE) 20 MG tablet 2 tabs po daily x 4 days Patient  not taking: Reported on 02/12/2018 02/08/18   Blane Ohara, MD                                                                                                                                    Past Surgical History Past Surgical History:  Procedure Laterality Date  . pregnancy termination     Family History Family History  Problem Relation Age of Onset  . Diabetes Father   . Cancer Maternal Aunt   . Anesthesia problems Neg Hx   . Hearing loss Neg Hx   . Stroke Neg Hx   . CAD Neg Hx     Social History Social History   Tobacco Use  . Smoking status: Current Some Day Smoker    Packs/day: 0.25    Years: 3.00    Pack years: 0.75    Types: Cigarettes  . Smokeless tobacco: Never Used  Substance Use Topics  . Alcohol use: No  . Drug use: No   Allergies Patient has  no known allergies.  Review of Systems Review of Systems  Respiratory: Positive for shortness of breath.   Cardiovascular: Negative for chest pain.  Gastrointestinal: Negative for abdominal pain.  Neurological: Negative for headaches.   All other systems are reviewed and are negative for acute change except as noted in the HPI  Physical Exam Vital Signs  I have reviewed the triage vital signs BP 134/87 (BP Location: Left Arm)   Pulse (!) 102   Temp 98 F (36.7 C) (Oral)   Resp 20   Ht 5\' 3"  (1.6 m)   Wt 102 kg   LMP 01/17/2018   SpO2 97%   BMI 39.85 kg/m   Physical Exam  Constitutional: She is oriented to person, place, and time. She appears well-developed and well-nourished. No distress.  HENT:  Head: Normocephalic and atraumatic.  Nose: Nose normal.  Eyes: Pupils are equal, round, and reactive to light. Conjunctivae and EOM are normal. Right eye exhibits no discharge. Left eye exhibits no discharge. No scleral icterus.  Neck: Normal range of motion. Neck supple.  Cardiovascular: Normal rate and regular rhythm. Exam reveals no gallop and no friction rub.  No murmur heard. Pulmonary/Chest: No  stridor. Tachypnea noted. She has wheezes (diffuse insp and exp wheezing). She has rhonchi. She has no rales.  Abdominal: Soft. She exhibits no distension. There is no tenderness.  Musculoskeletal: She exhibits no edema or tenderness.  Neurological: She is alert and oriented to person, place, and time.  Skin: Skin is warm and dry. No rash noted. She is not diaphoretic. No erythema.  Psychiatric: She has a normal mood and affect.  Vitals reviewed.   ED Results and Treatments Labs (all labs ordered are listed, but only abnormal results are displayed) Labs Reviewed - No data to display                                                                                                                       EKG  EKG Interpretation  Date/Time:    Ventricular Rate:    PR Interval:    QRS Duration:   QT Interval:    QTC Calculation:   R Axis:     Text Interpretation:        Radiology No results found. Pertinent labs & imaging results that were available during my care of the patient were reviewed by me and considered in my medical decision making (see chart for details).  Medications Ordered in ED Medications  albuterol (PROVENTIL,VENTOLIN) solution continuous neb (0 mg/hr Nebulization Stopped 02/12/18 0501)  albuterol (PROVENTIL HFA;VENTOLIN HFA) 108 (90 Base) MCG/ACT inhaler 2 puff (has no administration in time range)  albuterol (PROVENTIL) (2.5 MG/3ML) 0.083% nebulizer solution 5 mg (5 mg Nebulization Given 02/12/18 0309)  ipratropium (ATROVENT) nebulizer solution 0.5 mg (0.5 mg Nebulization Given 02/12/18 0352)  methylPREDNISolone sodium succinate (SOLU-MEDROL) 125 mg/2 mL injection 125 mg (125 mg Intravenous Given 02/12/18 0352)  Procedures Procedures CRITICAL CARE Performed by: Amadeo Garnet Cardama Total critical care time: 35 minutes Critical care  time was exclusive of separately billable procedures and treating other patients. Critical care was necessary to treat or prevent imminent or life-threatening deterioration. Critical care was time spent personally by me on the following activities: development of treatment plan with patient and/or surrogate as well as nursing, discussions with consultants, evaluation of patient's response to treatment, examination of patient, obtaining history from patient or surrogate, ordering and performing treatments and interventions, ordering and review of laboratory studies, ordering and review of radiographic studies, pulse oximetry and re-evaluation of patient's condition.   (including critical care time)  Medical Decision Making / ED Course I have reviewed the nursing notes for this encounter and the patient's prior records (if available in EHR or on provided paperwork).    Asthma exacerbation with diffuse wheezing. Sating well on RA. Treated with IV steroids and duonebs.  Required additional continuous nebulizer.  On reassessment, WOB and wheezing significantly improved.  The patient appears reasonably screened and/or stabilized for discharge and I doubt any other medical condition or other [facility] requiring further screening, evaluation, or treatment in the ED at this time prior to discharge.  The patient is safe for discharge with strict return precautions.   Final Clinical Impression(s) / ED Diagnoses Final diagnoses:  Moderate persistent asthma with exacerbation    Disposition: Discharge  Condition: Good  I have discussed the results, Dx and Tx plan with the patient who expressed understanding and agree(s) with the plan. Discharge instructions discussed at great length. The patient was given strict return precautions who verbalized understanding of the instructions. No further questions at time of discharge.    ED Discharge Orders    None       Follow Up: Primary care  provider  Schedule an appointment as soon as possible for a visit  As needed. If you do not have a primary care physician, contact [facility] at [phone] for referral     This chart was dictated using voice recognition software.  Despite best efforts to proofread,  errors can occur which can change the documentation meaning.   Nira Conn, MD 02/12/18 [phone]

## 2018-02-12 NOTE — ED Triage Notes (Signed)
Pt c/o asthma stating "I'm out of my MDI."

## 2018-02-17 ENCOUNTER — Inpatient Hospital Stay (HOSPITAL_COMMUNITY)
Admission: AD | Admit: 2018-02-17 | Discharge: 2018-02-17 | Disposition: A | Discharge status: Home / Self Care | Payer: Medicaid Other | Source: Ambulatory Visit | Attending: Obstetrics and Gynecology | Admitting: Obstetrics and Gynecology

## 2018-02-17 ENCOUNTER — Encounter (HOSPITAL_COMMUNITY): Payer: Self-pay | Admitting: *Deleted

## 2018-02-17 DIAGNOSIS — Z3200 Encounter for pregnancy test, result unknown: Secondary | ICD-10-CM

## 2018-02-17 DIAGNOSIS — Z711 Person with feared health complaint in whom no diagnosis is made: Secondary | ICD-10-CM | POA: Diagnosis not present

## 2018-02-17 NOTE — MAU Provider Note (Signed)
Chief Complaint: Possible Pregnancy   First Provider Initiated Contact with Patient 02/17/18 1741     SUBJECTIVE HPI: Gail Walker is a 27 y.o. G2P1011 who presents to maternity admissions for pregnancy confirmation. She denies vaginal bleeding or abdominal pain today.   Past Medical History:  Diagnosis Date  . Acne   . Asthma    uses albuterol inhaler daily  . Chlamydia 2012  . GERD (gastroesophageal reflux disease)    during pregnancy - takes zantac   Past Surgical History:  Procedure Laterality Date  . pregnancy termination     Social History   Socioeconomic History  . Marital status: Single    Spouse name: Not on file  . Number of children: Not on file  . Years of education: Not on file  . Highest education level: Not on file  Occupational History  . 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 Financial resource strain: Not on file  . Food insecurity:    Worry: Not on file    Inability: Not on file  . Transportation needs:    Medical: Not on file    Non-medical: Not on file  Tobacco Use  . Smoking status: Current Some Day Smoker    Packs/day: 0.25    Years: 3.00    Pack years: 0.75    Types: Cigarettes  . Smokeless tobacco: Never Used  Substance and Sexual Activity  . Alcohol use: No  . Drug use: No  . Sexual activity: Not on file  Lifestyle  . Physical activity:    Days per week: Not on file    Minutes per session: Not on file  . Stress: Not on file  Relationships  . Social connections:    Talks on phone: Not on file    Gets together: Not on file    Attends religious service: Not on file    Active member of club or organization: Not on file    Attends meetings of clubs or organizations: Not on file    Relationship status: Not on file  . Intimate partner violence:    Fear of current or ex partner: Not on file    Emotionally abused: Not on file    Physically abused: Not on file    Forced sexual activity: Not on file  Other Topics  Concern  . Not on file  Social History Narrative  . Not on file   No current facility-administered medications on file prior to encounter.    Current Outpatient Medications on File Prior to Encounter  Medication Sig Dispense Refill  . albuterol (PROVENTIL) (2.5 MG/3ML) 0.083% nebulizer solution Take 3 mLs (2.5 mg total) by nebulization every 6 (six) hours as needed for wheezing or shortness of breath. 75 mL 3  . predniSONE (DELTASONE) 10 MG tablet 6.5.4.3.2 1 taper (Patient not taking: Reported on 02/12/2018) 15 tablet 0  . predniSONE (DELTASONE) 20 MG tablet 2 tabs po daily x 4 days (Patient not taking: Reported on 02/12/2018) 8 tablet 0   No Known Allergies  ROS:  Review of Systems  Constitutional: Negative.  Negative for fatigue and fever.  HENT: Negative.   Respiratory: Negative.  Negative for shortness of breath.   Cardiovascular: Negative.  Negative for chest pain.  Gastrointestinal: Negative.  Negative for abdominal pain, constipation, diarrhea, nausea and vomiting.  Genitourinary: Negative.  Negative for dysuria, vaginal bleeding and vaginal discharge.  Neurological: Negative.  Negative for dizziness and headaches.    I have reviewed patient's  Past Medical Hx, Surgical Hx, Family Hx, Social Hx, medications and allergies.   Physical Exam   Patient Vitals for the past 24 hrs:  BP Temp Temp src Pulse Resp SpO2 Weight  02/17/18 1737 128/80 98.2 F (36.8 C) Oral (!) 101 18 96 % 119.7 kg   MDM Patient denies any concerning symptoms for ectopic. She has been advised that UPT will not be performed today strictly for pregnancy confirmation and that she may present to the [facility] during business hours for a pregnancy test.   ASSESSMENT 1. Possible pregnancy    PLAN Discharge home in stable condition First trimester/ectopic precautions discussed Patient advised to follow-up with [facility] for pregnancy test Patient may return to MAU as needed or if her condition were to change  or worsen   Rolm Bookbinder, [place] 02/17/2018 5:41 PM

## 2018-02-17 NOTE — MAU Note (Signed)
Gail Walker is a 27 y.o. here in MAU reporting: +HPT and desires to make sure she is indeed pregnant. Would like to know how far along she is. LMP: unsure of last cycle Denies vaginal bleeding. Pain score: denies

## 2018-02-19 ENCOUNTER — Ambulatory Visit (INDEPENDENT_AMBULATORY_CARE_PROVIDER_SITE_OTHER): Payer: Medicaid Other | Admitting: Family Medicine

## 2018-02-19 ENCOUNTER — Encounter: Payer: Self-pay | Admitting: Family Medicine

## 2018-02-19 VITALS — BP 122/77 | HR 102 | Wt 262.0 lb

## 2018-02-19 DIAGNOSIS — R1031 Right lower quadrant pain: Secondary | ICD-10-CM

## 2018-02-19 DIAGNOSIS — Z3201 Encounter for pregnancy test, result positive: Secondary | ICD-10-CM | POA: Diagnosis not present

## 2018-02-19 LAB — POCT PREGNANCY, URINE: Preg Test, Ur: POSITIVE — AB

## 2018-02-19 MED FILL — predniSONE 20 MG TABS: 20 | 4 days supply | Qty: 8 | Fill #0

## 2018-02-19 NOTE — Patient Instructions (Signed)
Ectopic Pregnancy °An ectopic pregnancy happens when a fertilized egg grows outside the uterus. A pregnancy cannot live outside of the uterus. This problem often happens in the fallopian tube. It is often caused by damage to the fallopian tube. °If this problem is found early, you may be treated with medicine. If your tube tears or bursts open (ruptures), you will bleed inside. This is an emergency. You will need surgery. Get help right away. °What are the signs or symptoms? °You may have normal pregnancy symptoms at first. These include: °· Missing your period. °· Feeling sick to your stomach (nauseous). °· Being tired. °· Having tender breasts. ° °Then, you may start to have symptoms that are not normal. These include: °· Pain with sex (intercourse). °· Bleeding from the vagina. This includes light bleeding (spotting). °· Belly (abdomen) or lower belly cramping or pain. This may be felt on one side. °· A fast heartbeat (pulse). °· Passing out (fainting) after going poop (bowel movement). ° °If your tube tears, you may have symptoms such as: °· Really bad pain in the belly or lower belly. This happens suddenly. °· Dizziness. °· Passing out. °· Shoulder pain. ° °Get help right away if: °You have any of these symptoms. This is an emergency. °This information is not intended to replace advice given to you by your health care provider. Make sure you discuss any questions you have with your health care provider. °Document Released: 07/01/2008 Document Revised: 09/10/2015 Document Reviewed: 11/14/2012 °Elsevier Interactive Patient Education © 2017 [employer] ° °

## 2018-02-19 NOTE — Progress Notes (Signed)
   OFFICE VISIT NOTE History:   Chief Complaint  Patient presents with  . Abdominal Pain    Positive home upt yesterday. Having lower abd pain for a wk. Pain is intermittent soreness and cramping. No bleeding or d/c   27 y.o. G3P1011 here today for above. Denies vaginal bleeding, discharge. Patient is undecided whether or not she wants to keep this pregnancy. States she already has a place picked out where she could get an abortion. Is having some mild soreness but comes/goes. Not having much discomfort right now. No nausea/vomiting.  No fevers. Hasn't taken anything for the pain.   The following portions of the patient's history were reviewed and updated as appropriate: allergies, current medications, past family history, past medical history, past social history, past surgical history and problem list.   Review of Systems:  Pertinent items noted in HPI ROS  Objective:  Physical Exam BP 122/77   Pulse (!) 102   Wt 118.8 kg   LMP  (LMP Unknown)   BMI 46.41 kg/m  Physical Exam  Constitutional: She is oriented to person, place, and time. She appears well-developed and well-nourished. No distress.  HENT:  Head: Normocephalic and atraumatic.  Pulmonary/Chest: No respiratory distress.  Abdominal: Soft. There is tenderness (mild RLQ).  obese  Genitourinary:  Genitourinary Comments: deferred  Neurological: She is alert and oriented to person, place, and time.  Psychiatric: She has a normal mood and affect. Her behavior is normal.  Nursing note and vitals reviewed.  Labs and Imaging Results for orders placed or performed in visit on 02/19/18 (from the past 168 hour(s))  Pregnancy, urine POC   Collection Time: 02/19/18  4:52 PM  Result Value Ref Range   Preg Test, Ur POSITIVE (A) NEGATIVE   Dg Chest 2 View  Result Date: 02/07/2018 CLINICAL DATA:  Shortness of breath for 3 days.  Smoker. EXAM: CHEST - 2 VIEW COMPARISON:  01/12/2018 FINDINGS: The heart size and mediastinal  contours are within normal limits. Both lungs are clear. The visualized skeletal structures are unremarkable. IMPRESSION: No active cardiopulmonary disease. Electronically Signed   By: Myles Rosenthal M.D.   On: 02/07/2018 11:24    Assessment & Plan:  27yo G3P1011 at unknown gestation because of unknown LMP who presents for pregnancy test. Test is positive. Ordered dating U/S, scheduled for 02/22/18. Reviewed ectopic precautions, no suspicion at this time. Patient is undecided on whether or not she is keeping this pregnancy, knows abortion providers she can access.   Cristal Deer. Earlene Plater, DO OB Family Medicine Fellow, [facility] for [employer], [facility]

## 2018-02-20 ENCOUNTER — Encounter: Payer: Self-pay | Admitting: Family Medicine

## 2018-02-22 ENCOUNTER — Ambulatory Visit (INDEPENDENT_AMBULATORY_CARE_PROVIDER_SITE_OTHER): Payer: Self-pay | Admitting: *Deleted

## 2018-02-22 ENCOUNTER — Ambulatory Visit (HOSPITAL_COMMUNITY)
Admission: RE | Admit: 2018-02-22 | Discharge: 2018-02-22 | Disposition: A | Discharge status: Home / Self Care | Payer: Medicaid Other | Source: Ambulatory Visit | Attending: Family Medicine | Admitting: Family Medicine

## 2018-02-22 DIAGNOSIS — O3680X0 Pregnancy with inconclusive fetal viability, not applicable or unspecified: Secondary | ICD-10-CM

## 2018-02-22 DIAGNOSIS — Z3201 Encounter for pregnancy test, result positive: Secondary | ICD-10-CM | POA: Insufficient documentation

## 2018-02-22 NOTE — Progress Notes (Signed)
Here for [place] results. Reviewed with Dr.Davis and then informed patient [place] does not yet show live baby, may be too early. Recommend [place] in 7-10 days. Ectopic precautions given. [place] scheduled. Patient voices understanding.

## 2018-02-22 NOTE — Progress Notes (Signed)
I have reviewed this chart and agree with the RN/CMA assessment and management.    K. Meryl Abad Manard, M.D. [facility]  

## 2018-03-05 ENCOUNTER — Ambulatory Visit (HOSPITAL_COMMUNITY): Admission: RE | Admit: 2018-03-05 | Payer: Self-pay | Source: Ambulatory Visit

## 2018-03-05 ENCOUNTER — Ambulatory Visit: Payer: Self-pay

## 2018-03-09 ENCOUNTER — Ambulatory Visit (HOSPITAL_COMMUNITY)
Admission: RE | Admit: 2018-03-09 | Discharge: 2018-03-09 | Disposition: A | Discharge status: Home / Self Care | Payer: Medicaid Other | Source: Ambulatory Visit | Attending: Obstetrics and Gynecology | Admitting: Obstetrics and Gynecology

## 2018-03-09 DIAGNOSIS — Z3A08 8 weeks gestation of pregnancy: Secondary | ICD-10-CM | POA: Insufficient documentation

## 2018-03-09 DIAGNOSIS — O3680X0 Pregnancy with inconclusive fetal viability, not applicable or unspecified: Secondary | ICD-10-CM | POA: Insufficient documentation

## 2018-03-12 ENCOUNTER — Encounter (HOSPITAL_COMMUNITY): Payer: Self-pay | Admitting: *Deleted

## 2018-03-12 ENCOUNTER — Inpatient Hospital Stay (HOSPITAL_COMMUNITY)
Admission: AD | Admit: 2018-03-12 | Discharge: 2018-03-12 | Disposition: A | Discharge status: Home / Self Care | Payer: Medicaid Other | Source: Ambulatory Visit | Attending: Obstetrics and Gynecology | Admitting: Obstetrics and Gynecology

## 2018-03-12 ENCOUNTER — Other Ambulatory Visit: Payer: Self-pay

## 2018-03-12 DIAGNOSIS — O210 Mild hyperemesis gravidarum: Secondary | ICD-10-CM | POA: Insufficient documentation

## 2018-03-12 DIAGNOSIS — Z87891 Personal history of nicotine dependence: Secondary | ICD-10-CM | POA: Diagnosis not present

## 2018-03-12 DIAGNOSIS — Z3A08 8 weeks gestation of pregnancy: Secondary | ICD-10-CM | POA: Insufficient documentation

## 2018-03-12 DIAGNOSIS — O26891 Other specified pregnancy related conditions, first trimester: Secondary | ICD-10-CM | POA: Diagnosis present

## 2018-03-12 DIAGNOSIS — R112 Nausea with vomiting, unspecified: Secondary | ICD-10-CM | POA: Diagnosis present

## 2018-03-12 HISTORY — DX: Mild hyperemesis gravidarum: O21.0

## 2018-03-12 LAB — URINALYSIS, ROUTINE W REFLEX MICROSCOPIC
Bilirubin Urine: NEGATIVE
Glucose, UA: NEGATIVE mg/dL
Hgb urine dipstick: NEGATIVE
KETONES UR: 5 mg/dL — AB
LEUKOCYTES UA: NEGATIVE
Nitrite: NEGATIVE
PROTEIN: NEGATIVE mg/dL
Specific Gravity, Urine: 1.03 (ref 1.005–1.030)
pH: 5 (ref 5.0–8.0)

## 2018-03-12 MED ORDER — ONDANSETRON HCL 8 MG PO TABS: 8.0000 mg | ORAL_TABLET | Freq: Three times a day (TID) | ORAL | 1 refills | Status: DC | PRN

## 2018-03-12 MED ORDER — ONDANSETRON HCL 4 MG PO TABS
8.0000 mg | ORAL_TABLET | Freq: Once | ORAL | Status: AC
  Administered 2018-03-12: 8 mg via ORAL
  Filled 2018-03-12: qty 2

## 2018-03-12 NOTE — MAU Provider Note (Signed)
History     CSN: [id number]  Arrival date and time: 03/12/18 1553   First Provider Initiated Contact with Patient 03/12/18 1708      Chief Complaint  Patient presents with  . Nausea  . Emesis   HPI  Ms.  Gail Walker is a 27 y.o. year old G79P1011 female at [redacted]w[redacted]d weeks gestation by U/S who presents to MAU reporting she started feeling nauseated about 1 week ago and vomiting  For the past "few days." She reports that "everything makes her sick". She can keep down liquids, but eating and smelling food makes her sick. She is unable to go to work, because of being sick. She has not taken any medication for N/V. She remembers taking Zofran in a previous pregnancy and would like to have that. She does not want any IVFs. She reports the last time she had something to drink (H2O) was 03/11/18.   Past Medical History:  Diagnosis Date  . Acne   . Asthma    uses albuterol inhaler daily  . CAP (community acquired pneumonia) 11/14/2015  . Chlamydia 2012  . GERD (gastroesophageal reflux disease)    during pregnancy - takes zantac    Past Surgical History:  Procedure Laterality Date  . pregnancy termination      Family History  Problem Relation Age of Onset  . Diabetes Father   . Cancer Maternal Aunt   . Anesthesia problems Neg Hx   . Hearing loss Neg Hx   . Stroke Neg Hx   . CAD Neg Hx     Social History   Tobacco Use  . Smoking status: Former Smoker    Packs/day: 0.25    Years: 3.00    Pack years: 0.75    Types: Cigarettes  . Smokeless tobacco: Never Used  Substance Use Topics  . Alcohol use: No  . Drug use: No    Allergies: No Known Allergies  Medications Prior to Admission  Medication Sig Dispense Refill Last Dose  . albuterol (PROVENTIL) (2.5 MG/3ML) 0.083% nebulizer solution Take 3 mLs (2.5 mg total) by nebulization every 6 (six) hours as needed for wheezing or shortness of breath. 75 mL 3 unk    Review of Systems  Constitutional: Positive for fatigue  ("can't go to work").  HENT: Negative.   Eyes: Negative.   Respiratory: Negative.   Cardiovascular: Negative.   Gastrointestinal: Positive for nausea and vomiting ("unable to keep anything down").  Endocrine: Negative.   Genitourinary: Negative.   Musculoskeletal: Negative.   Allergic/Immunologic: Negative.   Neurological: Negative.   Hematological: Negative.   Psychiatric/Behavioral: Negative.    Physical Exam   Blood pressure 131/87, pulse 96, temperature 97.6 F (36.4 C), temperature source Oral, resp. rate 18, weight 118.3 kg, SpO2 99 %.  Physical Exam  Nursing note and vitals reviewed. Constitutional: She is oriented to person, place, and time. She appears well-developed and well-nourished.  HENT:  Head: Normocephalic and atraumatic.  Eyes: Pupils are equal, round, and reactive to light.  Neck: Normal range of motion.  Cardiovascular: Normal rate.  Respiratory: Effort normal. She has wheezes.  GI: Soft.  Genitourinary:  Genitourinary Comments: Pelvic deferred  Musculoskeletal: Normal range of motion.  Neurological: She is alert and oriented to person, place, and time.  Skin: Skin is warm and dry.  Psychiatric: She has a normal mood and affect. Her behavior is normal. Judgment and thought content normal.    MAU Course  Procedures  MDM CCUA Zofran 8 mg po (per  pt request) -- improved nausea H2O (per pt request) -- able to tolerate well  Results for orders placed or performed during the hospital encounter of 03/12/18 (from the past 24 hour(s))  Urinalysis, Routine w reflex microscopic     Status: Abnormal   Collection Time: 03/12/18  4:29 PM  Result Value Ref Range   Color, Urine YELLOW YELLOW   APPearance CLEAR CLEAR   Specific Gravity, Urine 1.030 1.005 - 1.030   pH 5.0 5.0 - 8.0   Glucose, UA NEGATIVE NEGATIVE mg/dL   Hgb urine dipstick NEGATIVE NEGATIVE   Bilirubin Urine NEGATIVE NEGATIVE   Ketones, ur 5 (A) NEGATIVE mg/dL   Protein, ur NEGATIVE NEGATIVE  mg/dL   Nitrite NEGATIVE NEGATIVE   Leukocytes, UA NEGATIVE NEGATIVE    Assessment and Plan  Morning sickness - Plan: Discharge patient - Rx Zofran 8 mg po sent - Information provided on n/v - [facility] given - Discharge home - Patient verbalized an understanding of the plan of care and agrees.    Raelyn Moraolitta Halo Laski, MSN, CNM 03/12/2018, 5:08 PM

## 2018-03-12 NOTE — Discharge Instructions (Signed)
Morning Sickness Morning sickness is when you feel sick to your stomach (nauseous) during pregnancy. You may feel sick to your stomach and throw up (vomit). You may feel sick in the morning, but you can feel this way any time of day. Some women feel very sick to their stomach and cannot stop throwing up (hyperemesis gravidarum). Follow these instructions at home:  Only take medicines as told by your doctor.  Take multivitamins as told by your doctor. Taking multivitamins before getting pregnant can stop or lessen the harshness of morning sickness.  Eat dry toast or unsalted crackers before getting out of bed.  Eat 5 to 6 small meals a day.  Eat dry and bland foods like rice and baked potatoes.  Do not drink liquids with meals. Drink between meals.  Do not eat greasy, fatty, or spicy foods.  Have someone cook for you if the smell of food causes you to feel sick or throw up.  If you feel sick to your stomach after taking prenatal vitamins, take them at night or with a snack.  Eat protein when you need a snack (nuts, yogurt, cheese).  Eat unsweetened gelatins for dessert.  Wear a bracelet used for sea sickness (acupressure wristband).  Go to a doctor that puts thin needles into certain body points (acupuncture) to improve how you feel.  Do not smoke.  Use a humidifier to keep the air in your house free of odors.  Get lots of fresh air. Contact a doctor if:  You need medicine to feel better.  You feel dizzy or lightheaded.  You are losing weight. Get help right away if:  You feel very sick to your stomach and cannot stop throwing up.  You pass out (faint). This information is not intended to replace advice given to you by your health care provider. Make sure you discuss any questions you have with your health care provider. Document Released: 05/12/2004 Document Revised: 09/10/2015 Document Reviewed: 09/19/2012 Elsevier Interactive Patient Education  2017 Elsevier  Inc.   Eating Plan for Hyperemesis Gravidarum Hyperemesis gravidarum is a severe form of morning sickness. Because this condition causes severe nausea and vomiting, it can lead to dehydration, malnutrition, and weight loss. One way to lessen the symptoms of nausea and vomiting is to follow the eating plan for hyperemesis gravidarum. It is often used along with prescribed medicines to control your symptoms. What can I do to relieve my symptoms? Listen to your body. Everyone is different and has different preferences. Find what works best for you. Take any of the following actions that are helpful to you:  Eat and drink slowly.  Eat 5-6 small meals daily instead of 3 large meals.  Eat crackers before you get out of bed in the morning.  Try having a snack in the middle of the night.  Starchy foods are usually tolerated well. Examples include cereal, toast, bread, potatoes, pasta, rice, and pretzels.  Ginger may help with nausea. Add  tsp ground ginger to hot tea or choose ginger tea.  Try drinking 100% fruit juice or an electrolyte drink. An electrolyte drink contains sodium, potassium, and chloride.  Continue to take your prenatal vitamins as told by your health care provider. If you are having trouble taking your prenatal vitamins, talk with your health care provider about different options.  Include at least 1 serving of protein with your meals and snacks. Protein options include meats or poultry, beans, nuts, eggs, and yogurt. Try eating a protein-rich snack before  bed. Examples of these snacks include cheese and crackers or half of a peanut butter or [place] sandwich.  Consider eliminating foods that trigger your symptoms. These may include spicy foods, coffee, high-fat foods, very sweet foods, and acidic foods.  Try meals that have more protein combined with bland, salty, lower-fat, and dry foods, such as nuts, seeds, pretzels, crackers, and cereal.  Talk with your healthcare  provider about starting a supplement of vitamin B6.  Have fluids that are cold, clear, and carbonated or sour. Examples include lemonade, ginger ale, lemon-lime soda, ice water, and sparkling water.  Try lemon or mint tea.  Try brushing your teeth or using a mouth rinse after meals.  What should I avoid to reduce my symptoms? Avoiding some of the following things may help reduce your symptoms.  Foods with strong smells. Try eating meals in well-ventilated areas that are free of odors.  Drinking water or other beverages with meals. Try not to drink anything during the 30 minutes before and after your meals.  Drinking more than 1 cup of fluid at a time. Sometimes using a straw helps.  Fried or high-fat foods, such as butter and cream sauces.  Spicy foods.  Skipping meals as best as you can. Nausea can be more intense on an empty stomach. If you cannot tolerate food at that time, do not force it. Try sucking on ice chips or other frozen items, and make up for missed calories later.  Lying down within 2 hours after eating.  Environmental triggers. These may include smoky rooms, closed spaces, rooms with strong smells, warm or humid places, overly loud and noisy rooms, and rooms with motion or flickering lights.  Quick and sudden changes in your movement.  This information is not intended to replace advice given to you by your health care provider. Make sure you discuss any questions you have with your health care provider. Document Released: 01/30/2007 Document Revised: 12/02/2015 Document Reviewed: 11/03/2015 Elsevier Interactive Patient Education  2018 [employer].   [facility] [employer] Prenatal Care Providers          Center for [employer] [facility] @ [facility]   Phone: [phone]  Center for [facility] Healthcare @ [facility]   Phone: [phone]  [facility] For [facility] @[facility]       Phone: [phone]            Center for [facility] Healthcare @  [place]     Phone: [phone]          Center for [facility] @ [employer]   Phone: [phone]  Center for [facility] @ [facility]  Phone: [phone]     Family [address] [place])  Phone: [phone]

## 2018-03-12 NOTE — MAU Note (Signed)
Just been feeling real sick.  Throwing up. Everything makes her sick.  Can't go to work.  Needs something for it.

## 2018-03-14 ENCOUNTER — Other Ambulatory Visit: Payer: Self-pay

## 2018-03-14 ENCOUNTER — Encounter (HOSPITAL_COMMUNITY): Payer: Self-pay | Admitting: Emergency Medicine

## 2018-03-14 ENCOUNTER — Inpatient Hospital Stay (HOSPITAL_COMMUNITY)
Admission: AD | Admit: 2018-03-14 | Discharge: 2018-03-14 | Disposition: A | Discharge status: Home / Self Care | Payer: Medicaid Other | Attending: Obstetrics and Gynecology | Admitting: Obstetrics and Gynecology

## 2018-03-14 DIAGNOSIS — K219 Gastro-esophageal reflux disease without esophagitis: Secondary | ICD-10-CM | POA: Diagnosis not present

## 2018-03-14 DIAGNOSIS — O209 Hemorrhage in early pregnancy, unspecified: Secondary | ICD-10-CM | POA: Diagnosis present

## 2018-03-14 DIAGNOSIS — Z6791 Unspecified blood type, Rh negative: Secondary | ICD-10-CM

## 2018-03-14 DIAGNOSIS — Z87891 Personal history of nicotine dependence: Secondary | ICD-10-CM | POA: Insufficient documentation

## 2018-03-14 DIAGNOSIS — Z3A08 8 weeks gestation of pregnancy: Secondary | ICD-10-CM | POA: Diagnosis not present

## 2018-03-14 DIAGNOSIS — J45909 Unspecified asthma, uncomplicated: Secondary | ICD-10-CM | POA: Insufficient documentation

## 2018-03-14 DIAGNOSIS — O26891 Other specified pregnancy related conditions, first trimester: Secondary | ICD-10-CM | POA: Insufficient documentation

## 2018-03-14 LAB — URINALYSIS, ROUTINE W REFLEX MICROSCOPIC
Bilirubin Urine: NEGATIVE
Glucose, UA: NEGATIVE mg/dL
Ketones, ur: NEGATIVE mg/dL
Leukocytes, UA: NEGATIVE
Nitrite: NEGATIVE
Protein, ur: NEGATIVE mg/dL
SPECIFIC GRAVITY, URINE: 1.026 (ref 1.005–1.030)
pH: 5 (ref 5.0–8.0)

## 2018-03-14 LAB — WET PREP, GENITAL
Clue Cells Wet Prep HPF POC: NONE SEEN
SPERM: NONE SEEN
Trich, Wet Prep: NONE SEEN
YEAST WET PREP: NONE SEEN

## 2018-03-14 MED ORDER — RHO D IMMUNE GLOBULIN 1500 UNIT/2ML IJ SOSY
300.0000 ug | PREFILLED_SYRINGE | Freq: Once | INTRAMUSCULAR | Status: AC
  Administered 2018-03-14: 300 ug via INTRAMUSCULAR
  Filled 2018-03-14: qty 2

## 2018-03-14 NOTE — MAU Provider Note (Signed)
Chief Complaint: Vaginal Bleeding   First Provider Initiated Contact with Patient 03/14/18 2049     SUBJECTIVE HPI: Gail Walker is a 27 y.o. G3P1011 at [redacted]w[redacted]d who presents to Maternity Admissions reporting vaginal bleeding. Symptoms began this evening while at work. Reports passed a few small blood clots in the toilet. Bleeding has not continued. No recent intercourse. Has not started prenatal care yet but plans on going to [facility].   Location: lower abdomen Quality: cramping Severity: 5/10 on pain scale Duration: 3 hours Timing: constant Modifying factors: none Associated signs and symptoms: vaginal bleeding  Past Medical History:  Diagnosis Date  . Acne   . Asthma    uses albuterol inhaler daily  . CAP (community acquired pneumonia) 11/14/2015  . Chlamydia 2012  . GERD (gastroesophageal reflux disease)    during pregnancy - takes zantac   OB History  Gravida Para Term Preterm AB Living  3 1 1  0 1 1  SAB TAB Ectopic Multiple Live Births  0 1 0 0 1    # Outcome Date GA Lbr Len/2nd Weight Sex Delivery Anes PTL Lv  3 Current           2 Term 07/11/11 [redacted]w[redacted]d 19:42 / 02:26 3500 g M Vag-Spont EPI  LIV  1 TAB 07/2010           Past Surgical History:  Procedure Laterality Date  . pregnancy termination     Social History   Socioeconomic History  . Marital status: Single    Spouse name: Not on file  . Number of children: Not on file  . Years of education: Not on file  . Highest education level: Not on file  Occupational History  . 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 Financial resource strain: Not on file  . Food insecurity:    Worry: Not on file    Inability: Not on file  . Transportation needs:    Medical: Not on file    Non-medical: Not on file  Tobacco Use  . Smoking status: Former Smoker    Packs/day: 0.25    Years: 3.00    Pack years: 0.75    Types: Cigarettes  . Smokeless tobacco: Never Used  Substance and Sexual Activity  . Alcohol  use: No  . Drug use: No  . Sexual activity: Not Currently    Comment: not since pregnancy  Lifestyle  . Physical activity:    Days per week: Not on file    Minutes per session: Not on file  . Stress: Not on file  Relationships  . Social connections:    Talks on phone: Not on file    Gets together: Not on file    Attends religious service: Not on file    Active member of club or organization: Not on file    Attends meetings of clubs or organizations: Not on file    Relationship status: Not on file  . Intimate partner violence:    Fear of current or ex partner: Not on file    Emotionally abused: Not on file    Physically abused: Not on file    Forced sexual activity: Not on file  Other Topics Concern  . Not on file  Social History Narrative  . Not on file   Family History  Problem Relation Age of Onset  . Diabetes Father   . Cancer Maternal Aunt   . Anesthesia problems Neg Hx   . Hearing loss Neg  Hx   . Stroke Neg Hx   . CAD Neg Hx    No current facility-administered medications on file prior to encounter.    Current Outpatient Medications on File Prior to Encounter  Medication Sig Dispense Refill  . albuterol (PROVENTIL) (2.5 MG/3ML) 0.083% nebulizer solution Take 3 mLs (2.5 mg total) by nebulization every 6 (six) hours as needed for wheezing or shortness of breath. 75 mL 3  . ondansetron (ZOFRAN) 8 MG tablet Take 1 tablet (8 mg total) by mouth every 8 (eight) hours as needed for nausea or vomiting. 30 tablet 1   No Known Allergies  I have reviewed patient's Past Medical Hx, Surgical Hx, Family Hx, Social Hx, medications and allergies.   Review of Systems  Constitutional: Negative.   Gastrointestinal: Positive for abdominal pain. Negative for constipation, diarrhea, nausea and vomiting.  Genitourinary: Positive for vaginal bleeding. Negative for dysuria and vaginal discharge.    OBJECTIVE Patient Vitals for the past 24 hrs:  BP Temp Temp src Pulse SpO2 Height  Weight  03/14/18 2040 - - - - - 5\' 5"  (1.651 m) 73.5 kg  03/14/18 2035 114/62 - - (!) 104 - - -  03/14/18 2033 - 98.3 F (36.8 C) Oral - - - -  03/14/18 2032 - - - - 100 % - -   Constitutional: Well-developed, well-nourished female in no acute distress.  Cardiovascular: normal rate & rhythm, no murmur Respiratory: normal rate and effort. Lung sounds clear throughout GI: Abd soft, non-tender, Pos BS x 4. No guarding or rebound tenderness MS: Extremities nontender, no edema, normal ROM Neurologic: Alert and oriented x 4.  GU:     SPECULUM EXAM: NEFG, brown discharge, no active bleeding, cervix pink & smooth  BIMANUAL: No CMT. cervix closed; uterus normal size, no adnexal tenderness or masses.    LAB RESULTS Results for orders placed or performed during the hospital encounter of 03/14/18 (from the past 24 hour(s))  Urinalysis, Routine w reflex microscopic     Status: Abnormal   Collection Time: 03/14/18  8:34 PM  Result Value Ref Range   Color, Urine YELLOW YELLOW   APPearance CLEAR CLEAR   Specific Gravity, Urine 1.026 1.005 - 1.030   pH 5.0 5.0 - 8.0   Glucose, UA NEGATIVE NEGATIVE mg/dL   Hgb urine dipstick SMALL (A) NEGATIVE   Bilirubin Urine NEGATIVE NEGATIVE   Ketones, ur NEGATIVE NEGATIVE mg/dL   Protein, ur NEGATIVE NEGATIVE mg/dL   Nitrite NEGATIVE NEGATIVE   Leukocytes, UA NEGATIVE NEGATIVE   RBC / HPF 0-5 0 - 5 RBC/hpf   WBC, UA 0-5 0 - 5 WBC/hpf   Bacteria, UA RARE (A) NONE SEEN   Squamous Epithelial / LPF 0-5 0 - 5   Mucus PRESENT   Rh IG workup (includes ABO/Rh)     Status: None (Preliminary result)   Collection Time: 03/14/18  9:00 PM  Result Value Ref Range   Gestational Age(Wks) 8    ABO/RH(D) A NEG    Antibody Screen      [facility]formed [facility], [address] [place], [place] [address]    Unit Number [id number]    Blood Component Type RHIG    Unit division 00    Status of Unit ISSUED    Transfusion Status OK TO TRANSFUSE   Wet prep,  genital     Status: Abnormal   Collection Time: 03/14/18  9:07 PM  Result Value Ref Range   Yeast Wet Prep HPF POC  NONE SEEN NONE SEEN   Trich, Wet Prep NONE SEEN NONE SEEN   Clue Cells Wet Prep HPF POC NONE SEEN NONE SEEN   WBC, Wet Prep HPF POC FEW (A) NONE SEEN   Sperm NONE SEEN     IMAGING Pt informed that the ultrasound is considered a limited OB ultrasound and is not intended to be a complete ultrasound exam.  Patient also informed that the ultrasound is not being completed with the intent of assessing for fetal or placental anomalies or any pelvic abnormalities.  Explained that the purpose of today's ultrasound is to assess for  viability.  Patient acknowledges the purpose of the exam and the limitations of the study.   Live IUP with FHR 164   MAU COURSE Orders Placed This Encounter  Procedures  . Wet prep, genital  . Urinalysis, Routine w reflex microscopic  . Rh IG workup (includes ABO/Rh)  . Discharge patient   Meds ordered this encounter  Medications  . rho (d) immune globulin (RHIG/RHOPHYLAC) injection 300 mcg    MDM FHR verified by bedside ultrasound RH negative -- rhogam given today No active bleeding & cervix closed GC/CT & wet prep collected  ASSESSMENT 1. Vaginal bleeding in pregnancy, first trimester   2. Rh negative state in antepartum period, first trimester     PLAN Discharge home in stable condition. Bleeding precautions Start prenatal care  Follow-up Information    [facility] Follow up.   Contact information: [address] [place] [address] [phone]         Allergies as of 03/14/2018   No Known Allergies     Medication List    TAKE these medications   albuterol (2.5 MG/3ML) 0.083% nebulizer solution Commonly known as:  PROVENTIL Take 3 mLs (2.5 mg total) by nebulization every 6 (six) hours as needed for wheezing or shortness of breath.   ondansetron 8 MG tablet Commonly known as:   ZOFRAN Take 1 tablet (8 mg total) by mouth every 8 (eight) hours as needed for nausea or vomiting.        Judeth Horn, NP 03/14/2018  10:56 PM

## 2018-03-14 NOTE — MAU Note (Signed)
Pts states while at work around 1830- 1900 she went to [facility] and noticed some blood clots in the toilet. Pt co cramping that she rates a 5 on scale of 0-10. Denies burning with urination.

## 2018-03-14 NOTE — Discharge Instructions (Signed)
Threatened Miscarriage °A threatened miscarriage occurs when you have vaginal bleeding during your first 20 weeks of pregnancy but the pregnancy has not ended. If you have vaginal bleeding during this time, your health care provider will do tests to make sure you are still pregnant. If the tests show you are still pregnant and the developing baby (fetus) inside your womb (uterus) is still growing, your condition is considered a threatened miscarriage. °A threatened miscarriage does not mean your pregnancy will end, but it does increase the risk of losing your pregnancy (complete miscarriage). °What are the causes? °The cause of a threatened miscarriage is usually not known. If you go on to have a complete miscarriage, the most common cause is an abnormal number of chromosomes in the developing baby. Chromosomes are the structures inside cells that hold all your genetic material. °Some causes of vaginal bleeding that do not result in miscarriage include: °· Having sex. °· Having an infection. °· Normal hormone changes of pregnancy. °· Bleeding that occurs when an egg implants in your uterus. ° °What increases the risk? °Risk factors for bleeding in early pregnancy include: °· Obesity. °· Smoking. °· Drinking excessive amounts of alcohol or caffeine. °· Recreational drug use. ° °What are the signs or symptoms? °· Light vaginal bleeding. °· Mild abdominal pain or cramps. °How is this diagnosed? °If you have bleeding with or without abdominal pain before 20 weeks of pregnancy, your health care provider will do tests to check whether you are still pregnant. One important test involves using sound waves and a computer (ultrasound) to create images of the inside of your uterus. Other tests include an internal exam of your vagina and uterus (pelvic exam) and measurement of your baby’s heart rate. °You may be diagnosed with a threatened miscarriage if: °· Ultrasound testing shows you are still pregnant. °· Your baby’s heart  rate is strong. °· A pelvic exam shows that the opening between your uterus and your vagina (cervix) is closed. °· Your heart rate and blood pressure are stable. °· Blood tests confirm you are still pregnant. ° °How is this treated? °No treatments have been shown to prevent a threatened miscarriage from going on to a complete miscarriage. However, the right home care is important. °Follow these instructions at home: °· Make sure you keep all your appointments for prenatal care. This is very important. °· Get plenty of rest. °· Do not have sex or use tampons if you have vaginal bleeding. °· Do not douche. °· Do not smoke or use recreational drugs. °· Do not drink alcohol. °· Avoid caffeine. °Contact a health care provider if: °· You have light vaginal bleeding or spotting while pregnant. °· You have abdominal pain or cramping. °· You have a fever. °Get help right away if: °· You have heavy vaginal bleeding. °· You have blood clots coming from your vagina. °· You have severe low back pain or abdominal cramps. °· You have fever, chills, and severe abdominal pain. °This information is not intended to replace advice given to you by your health care provider. Make sure you discuss any questions you have with your health care provider. °Document Released: 04/04/2005 Document Revised: 09/10/2015 Document Reviewed: 01/29/2013 °Elsevier Interactive Patient Education © 2018 [employer] ° °

## 2018-03-15 LAB — RH IG WORKUP (INCLUDES ABO/RH)
ABO/RH(D): A NEG
ANTIBODY SCREEN: NEGATIVE
GESTATIONAL AGE(WKS): 8
Unit division: 0

## 2018-03-16 LAB — GC/CHLAMYDIA PROBE AMP (~~LOC~~) NOT AT ARMC
CHLAMYDIA, DNA PROBE: NEGATIVE
Neisseria Gonorrhea: NEGATIVE

## 2018-03-26 ENCOUNTER — Emergency Department (HOSPITAL_BASED_OUTPATIENT_CLINIC_OR_DEPARTMENT_OTHER): Payer: Medicaid Other

## 2018-03-26 ENCOUNTER — Other Ambulatory Visit: Payer: Self-pay

## 2018-03-26 ENCOUNTER — Emergency Department (HOSPITAL_BASED_OUTPATIENT_CLINIC_OR_DEPARTMENT_OTHER)
Admission: EM | Admit: 2018-03-26 | Discharge: 2018-03-26 | Disposition: A | Discharge status: Home / Self Care | Payer: Medicaid Other | Attending: Emergency Medicine | Admitting: Emergency Medicine

## 2018-03-26 ENCOUNTER — Encounter (HOSPITAL_BASED_OUTPATIENT_CLINIC_OR_DEPARTMENT_OTHER): Payer: Self-pay

## 2018-03-26 DIAGNOSIS — J45909 Unspecified asthma, uncomplicated: Secondary | ICD-10-CM | POA: Diagnosis not present

## 2018-03-26 DIAGNOSIS — Z87891 Personal history of nicotine dependence: Secondary | ICD-10-CM | POA: Insufficient documentation

## 2018-03-26 DIAGNOSIS — B9789 Other viral agents as the cause of diseases classified elsewhere: Secondary | ICD-10-CM | POA: Insufficient documentation

## 2018-03-26 DIAGNOSIS — O99512 Diseases of the respiratory system complicating pregnancy, second trimester: Secondary | ICD-10-CM | POA: Insufficient documentation

## 2018-03-26 DIAGNOSIS — F329 Major depressive disorder, single episode, unspecified: Secondary | ICD-10-CM | POA: Insufficient documentation

## 2018-03-26 DIAGNOSIS — J069 Acute upper respiratory infection, unspecified: Secondary | ICD-10-CM | POA: Insufficient documentation

## 2018-03-26 DIAGNOSIS — O99342 Other mental disorders complicating pregnancy, second trimester: Secondary | ICD-10-CM | POA: Insufficient documentation

## 2018-03-26 DIAGNOSIS — Z3A13 13 weeks gestation of pregnancy: Secondary | ICD-10-CM | POA: Diagnosis not present

## 2018-03-26 DIAGNOSIS — O9952 Diseases of the respiratory system complicating childbirth: Secondary | ICD-10-CM | POA: Diagnosis present

## 2018-03-26 MED ORDER — ALBUTEROL SULFATE (2.5 MG/3ML) 0.083% IN NEBU
INHALATION_SOLUTION | RESPIRATORY_TRACT | Status: AC
  Administered 2018-03-26: 2.5 mg
  Filled 2018-03-26: qty 3

## 2018-03-26 MED ORDER — ALBUTEROL SULFATE (2.5 MG/3ML) 0.083% IN NEBU
5.0000 mg | INHALATION_SOLUTION | Freq: Once | RESPIRATORY_TRACT | Status: AC
  Administered 2018-03-26: 5 mg via RESPIRATORY_TRACT
  Filled 2018-03-26: qty 6

## 2018-03-26 MED ORDER — ALBUTEROL SULFATE HFA 108 (90 BASE) MCG/ACT IN AERS
2.0000 | INHALATION_SPRAY | RESPIRATORY_TRACT | Status: DC
  Administered 2018-03-26: 2 via RESPIRATORY_TRACT
  Filled 2018-03-26: qty 6.7

## 2018-03-26 MED ORDER — IPRATROPIUM-ALBUTEROL 0.5-2.5 (3) MG/3ML IN SOLN
RESPIRATORY_TRACT | Status: AC
  Administered 2018-03-26: 3 mL
  Filled 2018-03-26: qty 3

## 2018-03-26 NOTE — ED Notes (Signed)
Patient transported to X-ray 

## 2018-03-26 NOTE — Discharge Instructions (Addendum)
You have been diagnosed today with viral upper respiratory tract infection with cough.  At this time there does not appear to be the presence of an emergent medical condition, however there is always the potential for conditions to change. Please read and follow the below instructions.  Please return to the Emergency Department immediately for any new or worsening symptoms. Please be sure to follow up with your Primary Care Provider this week regarding your visit today; please call their office to schedule an appointment even if you are feeling better for a follow-up visit. You may use the albuterol inhaler given you today as needed for your symptoms.  Please return to the emergency department immediately if you feel the medication is not helping or if you find you are using it too frequently. Please follow-up with your OB/GYN regarding your pregnancy this week.  You may also follow-up with [facility] regarding your pregnancy.  Get help right away if: You have very bad or constant: Headache. Ear pain. Pain in your forehead, behind your eyes, and over your cheekbones (sinus pain). Chest pain. You have long-lasting (chronic) lung disease and any of the following: Wheezing. Long-lasting cough. Coughing up blood. A change in your usual mucus. You have a stiff neck. You have changes in your: Vision. Hearing. Thinking. Mood. Get help right away if: You cough up blood. You have difficulty breathing. Your heartbeat is very fast. Fever  Please read the additional information packets attached to your discharge summary.  Do not take your medicine if  develop an itchy rash, swelling in your mouth or lips, or difficulty breathing.

## 2018-03-26 NOTE — ED Triage Notes (Signed)
Pt with flu like sx x 2 weeks-using albuterol inhaler without relief-NAD-steady gait-pt is 12-[redacted] weeks pregnant

## 2018-03-26 NOTE — ED Notes (Signed)
O2 Sat 98% on RA at registration.  HR 108, Resp 24

## 2018-03-26 NOTE — ED Notes (Signed)
C/o diff breathing chest congestion x 2 weeks  Using inhaler q 15 minutes and is out now    States has a productive cough,  Yellow sputum,   Pt sounds like head congestion

## 2018-03-26 NOTE — ED Provider Notes (Signed)
[facility] EMERGENCY DEPARTMENT Provider Note   CSN: [id number] Arrival date & time: 03/26/18  1552     History   Chief Complaint Chief Complaint  Patient presents with  . Cough    HPI Gail Walker is a 27 y.o. female reports being [redacted] weeks pregnant presenting today for 2-week history of runny nose, congestion and cough.  Patient states that she has been using her inhaler more frequently over the past few days and states that she has run out of her inhaler which is why she presents to the emergency department today.  She denies fever, hemoptysis, chest pain, abdominal pain, nausea or vomiting, leg swelling, shortness of breath, chest pain or dyspnea on exertion.  Patient states that she feels this is her normal asthma symptoms and she is only here for albuterol refill today.  Patient was given albuterol nebulizer prior to my evaluation.  On my evaluation patient states that she is breathing at baseline at this time and is requesting discharge.  Patient describes her cough as an intermittent, productive with yellow sputum cough that has been constant for the past 2 weeks.  Additionally patient states that she has not followed up with an OB/GYN regarding her pregnancy, she does not want a evaluation of her pregnancy today.  HPI  Past Medical History:  Diagnosis Date  . Acne   . Asthma    uses albuterol inhaler daily  . CAP (community acquired pneumonia) 11/14/2015  . Chlamydia 2012  . GERD (gastroesophageal reflux disease)    during pregnancy - takes zantac    Patient Active Problem List   Diagnosis Date Noted  . Morning sickness 03/12/2018  . Depression 11/14/2011  . Acne 04/26/2011  . TOBACCO USER 12/27/2008  . OBESITY, NOS 06/15/2006  . RHINITIS, ALLERGIC 06/15/2006  . Asthma 06/15/2006  . ECZEMA, ATOPIC DERMATITIS 06/15/2006    Past Surgical History:  Procedure Laterality Date  . pregnancy termination       OB History    Gravida  3   Para  1   Term  1   Preterm  0   AB  1   Living  1     SAB  0   TAB  1   Ectopic  0   Multiple  0   Live Births  1            Home Medications    Prior to Admission medications   Medication Sig Start Date End Date Taking? Authorizing Provider  albuterol (PROVENTIL) (2.5 MG/3ML) 0.083% nebulizer solution Take 3 mLs (2.5 mg total) by nebulization every 6 (six) hours as needed for wheezing or shortness of breath. 01/13/18   Elson Areas, PA-C  ondansetron (ZOFRAN) 8 MG tablet Take 1 tablet (8 mg total) by mouth every 8 (eight) hours as needed for nausea or vomiting. 03/12/18   Raelyn Mora, CNM    Family History Family History  Problem Relation Age of Onset  . Diabetes Father   . Cancer Maternal Aunt   . Anesthesia problems Neg Hx   . Hearing loss Neg Hx   . Stroke Neg Hx   . CAD Neg Hx     Social History Social History   Tobacco Use  . Smoking status: Former Smoker    Packs/day: 0.25    Years: 3.00    Pack years: 0.75    Types: Cigarettes  . Smokeless tobacco: Never Used  Substance Use Topics  . Alcohol use: No  .  Drug use: No     Allergies   Patient has no known allergies.   Review of Systems Review of Systems  Constitutional: Negative.  Negative for chills and fever.  HENT: Negative.  Negative for rhinorrhea and sore throat.   Eyes: Negative.  Negative for visual disturbance.  Respiratory: Positive for cough and chest tightness (Resolved prior to my evaluation). Negative for shortness of breath.   Cardiovascular: Negative.  Negative for chest pain.  Gastrointestinal: Negative.  Negative for abdominal pain, blood in stool, diarrhea, nausea and vomiting.  Genitourinary: Negative.  Negative for dysuria, hematuria, pelvic pain, vaginal bleeding, vaginal discharge and vaginal pain.  Musculoskeletal: Negative.  Negative for arthralgias and myalgias.  Skin: Negative.  Negative for rash.  Neurological: Negative.  Negative for dizziness, weakness and  headaches.   Physical Exam Updated Vital Signs BP 130/80 (BP Location: Right Arm)   Pulse 88   Temp 98.2 F (36.8 C) (Oral)   Resp 18   LMP  (LMP Unknown)   SpO2 100%   Physical Exam  Constitutional: She appears well-developed and well-nourished. No distress.  HENT:  Head: Normocephalic and atraumatic.  Right Ear: Hearing, tympanic membrane, external ear and ear canal normal.  Left Ear: Hearing, tympanic membrane, external ear and ear canal normal.  Nose: Mucosal edema and rhinorrhea present.  Mouth/Throat: Uvula is midline, oropharynx is clear and moist and mucous membranes are normal.  The patient has normal phonation and is in control of secretions. No stridor.  Midline uvula without edema. Soft palate rises symmetrically. No tonsillar erythema, swelling or exudates. Tongue protrusion is normal, floor of mouth is soft. No trismus. No creptius on neck palpation. No gingival erythema or fluctuance noted. Mucus membranes moist.  Eyes: Pupils are equal, round, and reactive to light. Conjunctivae and EOM are normal.  Neck: Trachea normal, normal range of motion, full passive range of motion without pain and phonation normal. Neck supple. No tracheal tenderness, no spinous process tenderness and no muscular tenderness present. No tracheal deviation present.  Cardiovascular: Normal rate, regular rhythm, normal heart sounds and intact distal pulses.  Pulses:      Dorsalis pedis pulses are 2+ on the right side, and 2+ on the left side.       Posterior tibial pulses are 2+ on the right side, and 2+ on the left side.  Pulmonary/Chest: Effort normal and breath sounds normal. No accessory muscle usage. No respiratory distress. She has no decreased breath sounds. She exhibits no tenderness, no crepitus and no deformity.  Abdominal: Soft. There is no tenderness. There is no rebound and no guarding.  Musculoskeletal: Normal range of motion.       Right lower leg: Normal.       Left lower leg:  Normal.  Feet:  Right Foot:  Protective Sensation: 3 sites tested. 3 sites sensed.  Left Foot:  Protective Sensation: 3 sites tested. 3 sites sensed.  Neurological: She is alert. GCS eye subscore is 4. GCS verbal subscore is 5. GCS motor subscore is 6.  Speech is clear and goal oriented, follows commands Major Cranial nerves without deficit, no facial droop Normal strength in upper and lower extremities bilaterally including dorsiflexion and plantar flexion, strong and equal grip strength Sensation normal to light touch Moves extremities without ataxia, coordination intact Normal gait  Skin: Skin is warm and dry.  Psychiatric: She has a normal mood and affect. Her behavior is normal.   ED Treatments / Results  Labs (all labs ordered are  listed, but only abnormal results are displayed) Labs Reviewed - No data to display  EKG None  Radiology Dg Chest 2 View  Result Date: 03/26/2018 CLINICAL DATA:  27 y/o F; shortness of breath with productive cough. Thirteen weeks pregnant. EXAM: CHEST - 2 VIEW COMPARISON:  02/07/2018 chest radiograph. FINDINGS: Stable heart size and mediastinal contours are within normal limits. Both lungs are clear. The visualized skeletal structures are unremarkable. IMPRESSION: No acute pulmonary process identified. Electronically Signed   By: Mitzi Hansen M.D.   On: 03/26/2018 21:46    Procedures Procedures (including critical care time)  Medications Ordered in ED Medications  albuterol (PROVENTIL HFA;VENTOLIN HFA) 108 (90 Base) MCG/ACT inhaler 2 puff (2 puffs Inhalation Given 03/26/18 2002)  albuterol (PROVENTIL) (2.5 MG/3ML) 0.083% nebulizer solution (2.5 mg  Given 03/26/18 1618)  ipratropium-albuterol (DUONEB) 0.5-2.5 (3) MG/3ML nebulizer solution (3 mLs  Given 03/26/18 1618)  albuterol (PROVENTIL) (2.5 MG/3ML) 0.083% nebulizer solution 5 mg (5 mg Nebulization Given 03/26/18 1942)     Initial Impression / Assessment and Plan / ED Course  I  have reviewed the triage vital signs and the nursing notes.  Pertinent labs & imaging results that were available during my care of the patient were reviewed by me and considered in my medical decision making (see chart for details).    27 year old otherwise healthy reportedly 13-week pregnant female presenting for 2-week history of cough/congestion patient requesting albuterol refill today.  Patient given albuterol nebulizer by nursing staff prior to my arrival with resolution of symptoms. Patient's CXR is negative for acute infiltrate.  Patient states that she is breathing at baseline and does not wish to have additional treatment today. Symptoms are likely of viral etiology. Discussed that antibiotics are not indicated for viral infections. Patient will be discharged with symptomatic treatment.  Patient given albuterol inhaler for home use.  Patient verbalizes understanding and is agreeable with plan. Patient is hemodynamically stable and in no acute distress prior to discharge.  Patient refused evaluation of her pregnancy, states that she was to follow-up with the women's clinic.  At discharge patient is afebrile, not tachycardic, not hypotensive, not tachypneic, SPO2 100% on room air.  Patient resting comfortably and in no acute distress.  At this time there does not appear to be any evidence of an acute emergency medical condition and the patient appears stable for discharge with appropriate outpatient follow up. Diagnosis was discussed with patient who verbalizes understanding of care plan and is agreeable to discharge. I have discussed return precautions with patient who verbalizes understanding of return precautions. Patient strongly encouraged to follow-up with their PCP this week. All questions answered.  Patient's case discussed with Dr. Anitra Lauth who agrees with plan to discharge with follow-up.   Note: Portions of this report may have been transcribed using voice recognition software. Every  effort was made to ensure accuracy; however, inadvertent computerized transcription errors may still be present. Final Clinical Impressions(s) / ED Diagnoses   Final diagnoses:  Viral URI with cough    ED Discharge Orders    None       Elizabeth Palau 03/27/18 0050    Gwyneth Sprout, MD 03/30/18 2043

## 2018-03-26 NOTE — ED Notes (Signed)
Using [facility]  Every 15mins, out of HFA at this time

## 2018-03-27 ENCOUNTER — Telehealth: Payer: Self-pay | Admitting: Family Medicine

## 2018-03-27 NOTE — Telephone Encounter (Signed)
Spoke with patient regarding lack of prenatal care. Went to ED yesterday for difficulty breathing and refused pregnancy evaluation at that time. [facility], at that time was considering termination of pregnancy. Today, patient [facility]. Encouraged patient to call and make appointment as she does not currently have an appointment scheduled. Patient verbalized understanding and did not have questions at this time.  Ellwood DenseAlison Rumball, DO PGY-2, [facility] 03/27/2018 9:51 AM

## 2018-03-30 ENCOUNTER — Encounter (HOSPITAL_COMMUNITY): Payer: Self-pay

## 2018-03-30 ENCOUNTER — Inpatient Hospital Stay (HOSPITAL_COMMUNITY)
Admission: AD | Admit: 2018-03-30 | Discharge: 2018-03-30 | Disposition: A | Discharge status: Home / Self Care | Payer: Medicaid Other | Attending: Obstetrics & Gynecology | Admitting: Obstetrics & Gynecology

## 2018-03-30 ENCOUNTER — Other Ambulatory Visit: Payer: Self-pay

## 2018-03-30 DIAGNOSIS — O131 Gestational [pregnancy-induced] hypertension without significant proteinuria, first trimester: Secondary | ICD-10-CM | POA: Insufficient documentation

## 2018-03-30 DIAGNOSIS — K117 Disturbances of salivary secretion: Secondary | ICD-10-CM | POA: Diagnosis not present

## 2018-03-30 DIAGNOSIS — O26891 Other specified pregnancy related conditions, first trimester: Secondary | ICD-10-CM

## 2018-03-30 DIAGNOSIS — Z3A11 11 weeks gestation of pregnancy: Secondary | ICD-10-CM | POA: Diagnosis not present

## 2018-03-30 DIAGNOSIS — O219 Vomiting of pregnancy, unspecified: Secondary | ICD-10-CM | POA: Diagnosis present

## 2018-03-30 LAB — COMPREHENSIVE METABOLIC PANEL
ALT: 17 U/L (ref 0–44)
AST: 14 U/L — ABNORMAL LOW (ref 15–41)
Albumin: 3.6 g/dL (ref 3.5–5.0)
Alkaline Phosphatase: 41 U/L (ref 38–126)
Anion gap: 10 (ref 5–15)
BUN: 8 mg/dL (ref 6–20)
CO2: 21 mmol/L — ABNORMAL LOW (ref 22–32)
Calcium: 9.3 mg/dL (ref 8.9–10.3)
Chloride: 103 mmol/L (ref 98–111)
Creatinine, Ser: 0.82 mg/dL (ref 0.44–1.00)
GFR calc Af Amer: >60 mL/min (ref >60)
GFR calc non Af Amer: >60 mL/min (ref >60)
Glucose, Bld: 87 mg/dL (ref 70–99)
Potassium: 4 mmol/L (ref 3.5–5.1)
Sodium: 134 mmol/L — ABNORMAL LOW (ref 135–145)
Total Bilirubin: 0.5 mg/dL (ref 0.3–1.2)
Total Protein: 7 g/dL (ref 6.5–8.1)

## 2018-03-30 LAB — CBC
HCT: 38.7 % (ref 36.0–46.0)
Hemoglobin: 12.9 g/dL (ref 12.0–15.0)
MCH: 27.4 pg (ref 26.0–34.0)
MCHC: 33.3 g/dL (ref 30.0–36.0)
MCV: 82.2 fL (ref 80.0–100.0)
Platelets: 299 10*3/uL (ref 150–400)
RBC: 4.71 MIL/uL (ref 3.87–5.11)
RDW: 13.7 % (ref 11.5–15.5)
WBC: 6.5 10*3/uL (ref 4.0–10.5)
nRBC: 0 % (ref 0.0–0.2)

## 2018-03-30 LAB — URINALYSIS, ROUTINE W REFLEX MICROSCOPIC
Bilirubin Urine: NEGATIVE
Glucose, UA: NEGATIVE mg/dL
Hgb urine dipstick: NEGATIVE
Ketones, ur: 80 mg/dL — AB
Nitrite: NEGATIVE
Protein, ur: 100 mg/dL — AB
Specific Gravity, Urine: 1.031 — ABNORMAL HIGH (ref 1.005–1.030)
pH: 5 (ref 5.0–8.0)

## 2018-03-30 MED ORDER — GLYCOPYRROLATE 1 MG PO TABS: 1.0000 mg | ORAL_TABLET | Freq: Three times a day (TID) | ORAL | 1 refills | Status: DC | PRN

## 2018-03-30 MED ORDER — GLYCOPYRROLATE 0.2 MG/ML IJ SOLN
0.2000 mg | Freq: Once | INTRAMUSCULAR | Status: AC
  Administered 2018-03-30: 0.2 mg via INTRAVENOUS
  Filled 2018-03-30: qty 1

## 2018-03-30 MED ORDER — LACTATED RINGERS IV BOLUS
1000.0000 mL | Freq: Once | INTRAVENOUS | Status: AC
  Administered 2018-03-30: 1000 mL via INTRAVENOUS

## 2018-03-30 MED ORDER — PROMETHAZINE HCL 25 MG/ML IJ SOLN
25.0000 mg | Freq: Once | INTRAMUSCULAR | Status: AC
  Administered 2018-03-30: 25 mg via INTRAVENOUS
  Filled 2018-03-30: qty 1

## 2018-03-30 MED ORDER — M.V.I. ADULT IV INJ
Freq: Once | INTRAVENOUS | Status: AC
  Administered 2018-03-30: 13:00:00 via INTRAVENOUS
  Filled 2018-03-30: qty 10

## 2018-03-30 MED ORDER — PROMETHAZINE HCL 25 MG PO TABS: 25.0000 mg | ORAL_TABLET | Freq: Four times a day (QID) | ORAL | 1 refills | Status: DC | PRN

## 2018-03-30 NOTE — MAU Provider Note (Signed)
History     CSN: [id number]  Arrival date and time: 03/30/18 1036   First Provider Initiated Contact with Patient 03/30/18 1138      Chief Complaint  Patient presents with  . Emesis   Gail Walker is a 27 y.o. G3P1 at 3888w1d by early US who presents to MAU with complaints of nausea and vomiting. She reports emesis has been occurring since November but has increased this week where she is unable to "keep anything down, no food no liquids". She was taking Zofran for emesis and reports "medication stopped working so stopped taking it two days ago". She has not been on any other medication for emesis and has not tried anything at home for relief.  She denies abdominal pain, vaginal bleeding, vaginal discharge, or HA.    OB History    Gravida  3   Para  1   Term  1   Preterm  0   AB  1   Living  1     SAB  0   TAB  1   Ectopic  0   Multiple  0   Live Births  1           Past Medical History:  Diagnosis Date  . Acne   . Asthma    uses albuterol inhaler daily  . CAP (community acquired pneumonia) 11/14/2015  . Chlamydia 2012  . GERD (gastroesophageal reflux disease)    during pregnancy - takes zantac    Past Surgical History:  Procedure Laterality Date  . pregnancy termination      Family History  Problem Relation Age of Onset  . Diabetes Father   . Cancer Maternal Aunt   . Anesthesia problems Neg Hx   . Hearing loss Neg Hx   . Stroke Neg Hx   . CAD Neg Hx     Social History   Tobacco Use  . Smoking status: Former Smoker    Packs/day: 0.25    Years: 3.00    Pack years: 0.75    Types: Cigarettes  . Smokeless tobacco: Never Used  Substance Use Topics  . Alcohol use: No  . Drug use: No    Allergies: No Known Allergies  Medications Prior to Admission  Medication Sig Dispense Refill Last Dose  . ondansetron (ZOFRAN) 8 MG tablet Take 1 tablet (8 mg total) by mouth every 8 (eight) hours as needed for nausea or vomiting. 30 tablet 1 Past  Week at Unknown time  . albuterol (PROVENTIL) (2.5 MG/3ML) 0.083% nebulizer solution Take 3 mLs (2.5 mg total) by nebulization every 6 (six) hours as needed for wheezing or shortness of breath. 75 mL 3 unk    Review of Systems  Constitutional: Negative.   Respiratory: Negative.   Cardiovascular: Negative.   Gastrointestinal: Positive for nausea and vomiting. Negative for constipation and diarrhea.  Genitourinary: Negative.   Neurological: Negative.    Physical Exam   Blood pressure 128/75, pulse 95, temperature 98.3 F (36.8 C), temperature source Oral, resp. rate 20, weight 115.1 kg, SpO2 99 %.  Physical Exam  Nursing note and vitals reviewed. Constitutional: She is oriented to person, place, and time. She appears well-developed and well-nourished. No distress.  Cardiovascular: Normal rate, regular rhythm and normal heart sounds.  Respiratory: Effort normal and breath sounds normal. No respiratory distress. She has no wheezes.  GI: Soft. Bowel sounds are normal. She exhibits no distension. There is no abdominal tenderness. There is no rebound and no guarding.  Musculoskeletal: Normal range of motion.        General: No edema.  Neurological: She is alert and oriented to person, place, and time.  Psychiatric: She has a normal mood and affect. Her behavior is normal. Thought content normal.   Bedside US   Pt informed that the ultrasound is considered a limited OB ultrasound and is not intended to be a complete ultrasound exam.  Patient also informed that the ultrasound is not being completed with the intent of assessing for fetal or placental anomalies or any pelvic abnormalities.  Explained that the purpose of today's ultrasound is to assess for  viability.  Patient acknowledges the purpose of the exam and the limitations of the study.    FHR 167 by bedside US   MAU Course  Procedures  MDM Orders Placed This Encounter  Procedures  . Urinalysis, Routine w reflex microscopic  .  CBC  . Comprehensive metabolic panel  . Insert peripheral IV   HTN noted on arrival to MAU, repeat BP x2 WNL, patient denies hx of HTN prior to pregnancy UA: large ketones and protein, otherwise negative  CBC and CMP WNL   Meds ordered this encounter  Medications  . lactated ringers bolus 1,000 mL  . multivitamins adult (INFUVITE ADULT) 10 mL in dextrose 5% lactated ringers 1,000 mL infusion  . promethazine (PHENERGAN) injection 25 mg  . glycopyrrolate (ROBINUL) injection 0.2 mg  . glycopyrrolate (ROBINUL) 1 MG tablet    Sig: Take 1 tablet (1 mg total) by mouth 3 (three) times daily as needed.    Dispense:  30 tablet    Refill:  1    Order Specific Question:   Supervising Provider    Answer:   Jaynie Collins A [[id number]]  . promethazine (PHENERGAN) 25 MG tablet    Sig: Take 1 tablet (25 mg total) by mouth every 6 (six) hours as needed for nausea or vomiting.    Dispense:  30 tablet    Refill:  1    Order Specific Question:   Supervising Provider    Answer:   Tereso Newcomer [[id number]]   Results for orders placed or performed during the hospital encounter of 03/30/18 (from the past 24 hour(s))  Urinalysis, Routine w reflex microscopic     Status: Abnormal   Collection Time: 03/30/18 11:34 AM  Result Value Ref Range   Color, Urine AMBER (A) YELLOW   APPearance CLOUDY (A) CLEAR   Specific Gravity, Urine 1.031 (H) 1.005 - 1.030   pH 5.0 5.0 - 8.0   Glucose, UA NEGATIVE NEGATIVE mg/dL   Hgb urine dipstick NEGATIVE NEGATIVE   Bilirubin Urine NEGATIVE NEGATIVE   Ketones, ur 80 (A) NEGATIVE mg/dL   Protein, ur 130 (A) NEGATIVE mg/dL   Nitrite NEGATIVE NEGATIVE   Leukocytes, UA TRACE (A) NEGATIVE   RBC / HPF 0-5 0 - 5 RBC/hpf   WBC, UA 6-10 0 - 5 WBC/hpf   Bacteria, UA RARE (A) NONE SEEN   Squamous Epithelial / LPF 21-50 0 - 5   Mucus PRESENT   CBC     Status: None   Collection Time: 03/30/18 11:45 AM  Result Value Ref Range   WBC 6.5 4.0 - 10.5 K/uL   RBC 4.71 3.87 - 5.11 MIL/uL    Hemoglobin 12.9 12.0 - 15.0 g/dL   HCT 86.5 78.4 - 69.6 %   MCV 82.2 80.0 - 100.0 fL   MCH 27.4 26.0 - 34.0 pg   MCHC 33.3 30.0 -  36.0 g/dL   RDW 96.0 45.4 - 09.8 %   Platelets 299 150 - 400 K/uL   nRBC 0.0 0.0 - 0.2 %  Comprehensive metabolic panel     Status: Abnormal   Collection Time: 03/30/18 11:45 AM  Result Value Ref Range   Sodium 134 (L) 135 - 145 mmol/L   Potassium 4.0 3.5 - 5.1 mmol/L   Chloride 103 98 - 111 mmol/L   CO2 21 (L) 22 - 32 mmol/L   Glucose, Bld 87 70 - 99 mg/dL   BUN 8 6 - 20 mg/dL   Creatinine, Ser 1.19 0.44 - 1.00 mg/dL   Calcium 9.3 8.9 - 14.7 mg/dL   Total Protein 7.0 6.5 - 8.1 g/dL   Albumin 3.6 3.5 - 5.0 g/dL   AST 14 (L) 15 - 41 U/L   ALT 17 0 - 44 U/L   Alkaline Phosphatase 41 38 - 126 U/L   Total Bilirubin 0.5 0.3 - 1.2 mg/dL   GFR calc non Af Amer >60 >60 mL/min   GFR calc Af Amer >60 >60 mL/min   Anion gap 10 5 - 15   Treatments in MAU included LR IV bolus, phenergan IV, robinul IV and banana bag. Patient able to tolerate crackers and ice chips prior to discharge home. Patient reports feeling better. Rx for phenergan and robinul sent to pharmacy of choice. Educated and reasons to return to MAU. Make initial prenatal visit. Pt stable at time of discharge.  Assessment and Plan   1. Nausea and vomiting during pregnancy   2. [redacted] weeks gestation of pregnancy   3. Ptyalism   4. Transient hypertension of pregnancy in first trimester    Discharge home  Encouraged to make initial prenatal visit appointment  Return to MAU as needed Continue to monitor blood pressure  Rx for Robinul and Phenergan sent to pharmacy of choice   Follow-up Information    [facility]. Schedule an appointment as soon as possible for a visit.   Specialty:  Obstetrics and Gynecology Why:  Make appointment to be seen for initial prenatal visit  Contact information: [address], [address] [place]  [address] [phone]         Allergies as of 03/30/2018   No Known Allergies     Medication List    STOP taking these medications   ondansetron 8 MG tablet Commonly known as:  ZOFRAN     TAKE these medications   albuterol (2.5 MG/3ML) 0.083% nebulizer solution Commonly known as:  PROVENTIL Take 3 mLs (2.5 mg total) by nebulization every 6 (six) hours as needed for wheezing or shortness of breath.   glycopyrrolate 1 MG tablet Commonly known as:  ROBINUL Take 1 tablet (1 mg total) by mouth 3 (three) times daily as needed.   promethazine 25 MG tablet Commonly known as:  PHENERGAN Take 1 tablet (25 mg total) by mouth every 6 (six) hours as needed for nausea or vomiting.       Sharyon Cable CNM  03/30/2018, 1:57 PM

## 2018-03-30 NOTE — Discharge Instructions (Signed)

## 2018-03-30 NOTE — MAU Note (Signed)
Been throwing up constantly, can't keep anything down. Nothing is helping, basically throwing up the lining of her stomach. Been throwing up blood too.

## 2018-04-09 ENCOUNTER — Inpatient Hospital Stay (HOSPITAL_COMMUNITY)
Admission: AD | Admit: 2018-04-09 | Discharge: 2018-04-09 | Disposition: A | Discharge status: Home / Self Care | Payer: Medicaid Other | Source: Ambulatory Visit | Attending: Obstetrics and Gynecology | Admitting: Obstetrics and Gynecology

## 2018-04-09 ENCOUNTER — Encounter (HOSPITAL_COMMUNITY): Payer: Self-pay | Admitting: *Deleted

## 2018-04-09 DIAGNOSIS — O210 Mild hyperemesis gravidarum: Secondary | ICD-10-CM | POA: Diagnosis present

## 2018-04-09 DIAGNOSIS — O26891 Other specified pregnancy related conditions, first trimester: Secondary | ICD-10-CM | POA: Diagnosis not present

## 2018-04-09 DIAGNOSIS — E860 Dehydration: Secondary | ICD-10-CM | POA: Insufficient documentation

## 2018-04-09 DIAGNOSIS — Z3A12 12 weeks gestation of pregnancy: Secondary | ICD-10-CM | POA: Insufficient documentation

## 2018-04-09 DIAGNOSIS — J45909 Unspecified asthma, uncomplicated: Secondary | ICD-10-CM | POA: Insufficient documentation

## 2018-04-09 DIAGNOSIS — K219 Gastro-esophageal reflux disease without esophagitis: Secondary | ICD-10-CM | POA: Diagnosis not present

## 2018-04-09 DIAGNOSIS — Z79899 Other long term (current) drug therapy: Secondary | ICD-10-CM | POA: Insufficient documentation

## 2018-04-09 DIAGNOSIS — O99511 Diseases of the respiratory system complicating pregnancy, first trimester: Secondary | ICD-10-CM | POA: Diagnosis not present

## 2018-04-09 DIAGNOSIS — O99611 Diseases of the digestive system complicating pregnancy, first trimester: Secondary | ICD-10-CM | POA: Diagnosis not present

## 2018-04-09 DIAGNOSIS — O99281 Endocrine, nutritional and metabolic diseases complicating pregnancy, first trimester: Secondary | ICD-10-CM | POA: Insufficient documentation

## 2018-04-09 DIAGNOSIS — Z87891 Personal history of nicotine dependence: Secondary | ICD-10-CM | POA: Insufficient documentation

## 2018-04-09 LAB — URINALYSIS, ROUTINE W REFLEX MICROSCOPIC
Glucose, UA: NEGATIVE mg/dL
Hgb urine dipstick: NEGATIVE
Ketones, ur: 20 mg/dL — AB
Leukocytes, UA: NEGATIVE
Nitrite: NEGATIVE
Protein, ur: 100 mg/dL — AB
Specific Gravity, Urine: 1.029 (ref 1.005–1.030)
Squamous Epithelial / HPF: >50 — ABNORMAL HIGH (ref 0–5)
pH: 5 (ref 5.0–8.0)

## 2018-04-09 MED ORDER — SCOPOLAMINE 1 MG/3DAYS TD PT72
1.0000 | MEDICATED_PATCH | TRANSDERMAL | Status: DC
  Administered 2018-04-09: 1.5 mg via TRANSDERMAL
  Filled 2018-04-09: qty 1

## 2018-04-09 MED ORDER — FAMOTIDINE 20 MG PO TABS: 20.0000 mg | ORAL_TABLET | Freq: Every day | ORAL | 1 refills | Status: DC

## 2018-04-09 MED ORDER — SODIUM CHLORIDE 0.9 % IV SOLN
8.0000 mg | Freq: Once | INTRAVENOUS | Status: AC
  Administered 2018-04-09: 8 mg via INTRAVENOUS
  Filled 2018-04-09 (×2): qty 4

## 2018-04-09 MED ORDER — LACTATED RINGERS IV BOLUS
1000.0000 mL | Freq: Once | INTRAVENOUS | Status: AC
  Administered 2018-04-09: 1000 mL via INTRAVENOUS

## 2018-04-09 MED ORDER — SCOPOLAMINE 1 MG/3DAYS TD PT72: 1.0000 | MEDICATED_PATCH | TRANSDERMAL | 2 refills | Status: DC

## 2018-04-09 MED ORDER — FAMOTIDINE IN NACL 20-0.9 MG/50ML-% IV SOLN
20.0000 mg | Freq: Once | INTRAVENOUS | Status: AC
  Administered 2018-04-09: 20 mg via INTRAVENOUS
  Filled 2018-04-09: qty 50

## 2018-04-09 MED ORDER — M.V.I. ADULT IV INJ
Freq: Once | INTRAVENOUS | Status: AC
  Administered 2018-04-09: 22:00:00 via INTRAVENOUS
  Filled 2018-04-09: qty 10

## 2018-04-09 NOTE — Discharge Instructions (Signed)

## 2018-04-09 NOTE — MAU Note (Signed)
Pt reports constantly vomiting. States she has vomited 10-12 times today. Cannot keep anything down. States she has not eaten anything in 2 weeks. States she is RX'd promethazine-last took yesterday. States she threw it up immediately.

## 2018-04-09 NOTE — MAU Provider Note (Addendum)
History     CSN: [id number]  Arrival date and time: 04/09/18 1919   First Provider Initiated Contact with Patient 04/09/18 2001      Chief Complaint  Patient presents with  . Emesis   Gail Walker is a 27 y.o. G3P1 at [redacted]w[redacted]d who presents to MAU with complaints of continued nausea/vomiting. She reports vomiting 12 times today, unable to keep down liquids or food. She was taking phenergan, reports medication not working and vomiting after taking medication. She denies abdominal pain, vaginal bleeding or vaginal discharge.    OB History    Gravida  3   Para  1   Term  1   Preterm  0   AB  1   Living  1     SAB  0   TAB  1   Ectopic  0   Multiple  0   Live Births  1           Past Medical History:  Diagnosis Date  . Acne   . Asthma    uses albuterol inhaler daily  . CAP (community acquired pneumonia) 11/14/2015  . Chlamydia 2012  . GERD (gastroesophageal reflux disease)    during pregnancy - takes zantac    Past Surgical History:  Procedure Laterality Date  . pregnancy termination      Family History  Problem Relation Age of Onset  . Diabetes Father   . Cancer Maternal Aunt   . Anesthesia problems Neg Hx   . Hearing loss Neg Hx   . Stroke Neg Hx   . CAD Neg Hx     Social History   Tobacco Use  . Smoking status: Former Smoker    Packs/day: 0.25    Years: 3.00    Pack years: 0.75    Types: Cigarettes    Last attempt to quit: 02/07/2018    Years since quitting: 0.1  . Smokeless tobacco: Never Used  Substance Use Topics  . Alcohol use: No  . Drug use: No    Allergies: No Known Allergies  Medications Prior to Admission  Medication Sig Dispense Refill Last Dose  . albuterol (PROVENTIL) (2.5 MG/3ML) 0.083% nebulizer solution Take 3 mLs (2.5 mg total) by nebulization every 6 (six) hours as needed for wheezing or shortness of breath. 75 mL 3 04/09/2018 at Unknown time  . glycopyrrolate (ROBINUL) 1 MG tablet Take 1 tablet (1 mg total)  by mouth 3 (three) times daily as needed. 30 tablet 1 04/08/2018 at Unknown time  . promethazine (PHENERGAN) 25 MG tablet Take 1 tablet (25 mg total) by mouth every 6 (six) hours as needed for nausea or vomiting. 30 tablet 1 04/08/2018 at Unknown time    Review of Systems  Constitutional: Negative.   Respiratory: Negative.   Cardiovascular: Negative.   Gastrointestinal: Positive for nausea and vomiting. Negative for abdominal pain, constipation and diarrhea.  Genitourinary: Negative.    Physical Exam   Blood pressure 127/77, pulse 91, temperature 98.3 F (36.8 C), temperature source Oral, resp. rate 16, height 5\' 5"  (1.651 m), weight 111.6 kg, SpO2 97 %.  Physical Exam  Nursing note and vitals reviewed. Constitutional: She is oriented to person, place, and time. She appears well-developed and well-nourished. No distress.  Cardiovascular: Normal rate, regular rhythm and normal heart sounds.  Respiratory: Effort normal and breath sounds normal. No respiratory distress. She has no wheezes. She has no rales.  GI: Soft. She exhibits no distension. There is no abdominal tenderness. There is  no rebound.  Neurological: She is alert and oriented to person, place, and time.  Skin:  Mucous membranes dry   Psychiatric: She has a normal mood and affect. Her behavior is normal. Thought content normal.   Pt informed that the ultrasound is considered a limited OB ultrasound and is not intended to be a complete ultrasound exam.  Patient also informed that the ultrasound is not being completed with the intent of assessing for fetal or placental anomalies or any pelvic abnormalities.  Explained that the purpose of today's ultrasound is to assess for  viability.  Patient acknowledges the purpose of the exam and the limitations of the study.    FHR 136 by bedside US    Results for orders placed or performed during the hospital encounter of 04/09/18 (from the past 24 hour(s))  Urinalysis, Routine w reflex  microscopic     Status: Abnormal   Collection Time: 04/09/18  7:57 PM  Result Value Ref Range   Color, Urine AMBER (A) YELLOW   APPearance CLOUDY (A) CLEAR   Specific Gravity, Urine 1.029 1.005 - 1.030   pH 5.0 5.0 - 8.0   Glucose, UA NEGATIVE NEGATIVE mg/dL   Hgb urine dipstick NEGATIVE NEGATIVE   Bilirubin Urine SMALL (A) NEGATIVE   Ketones, ur 20 (A) NEGATIVE mg/dL   Protein, ur 161100 (A) NEGATIVE mg/dL   Nitrite NEGATIVE NEGATIVE   Leukocytes, UA NEGATIVE NEGATIVE   Bacteria, UA FEW (A) NONE SEEN   Squamous Epithelial / LPF >50 (H) 0 - 5   Mucus PRESENT    MAU Course  Procedures  MDM Orders Placed This Encounter  Procedures  . Urinalysis, Routine w reflex microscopic  . Insert peripheral IV  . Discharge patient   Meds ordered this encounter  Medications  . ondansetron (ZOFRAN) 8 mg in sodium chloride 0.9 % 50 mL IVPB  . famotidine (PEPCID) IVPB 20 mg premix  . lactated ringers bolus 1,000 mL  . multivitamins adult (INFUVITE ADULT) 10 mL in dextrose 5% lactated ringers 1,000 mL infusion  . scopolamine (TRANSDERM-SCOP) 1 MG/3DAYS 1.5 mg  . DISCONTD: scopolamine (TRANSDERM-SCOP) 1 MG/3DAYS    Sig: Place 1 patch (1.5 mg total) onto the skin every 3 (three) days.    Dispense:  10 patch    Refill:  2    Order Specific Question:   Supervising Provider    Answer:   Conan BowensAVIS, KELLY M [[id number]]  . DISCONTD: famotidine (PEPCID) 20 MG tablet    Sig: Take 1 tablet (20 mg total) by mouth daily.    Dispense:  30 tablet    Refill:  1    Order Specific Question:   Supervising Provider    Answer:   Conan BowensAVIS, KELLY M [[id number]]  . scopolamine (TRANSDERM-SCOP) 1 MG/3DAYS    Sig: Place 1 patch (1.5 mg total) onto the skin every 3 (three) days.    Dispense:  10 patch    Refill:  2    Order Specific Question:   Supervising Provider    Answer:   Conan BowensAVIS, KELLY M [[id number]]  . famotidine (PEPCID) 20 MG tablet    Sig: Take 1 tablet (20 mg total) by mouth daily.    Dispense:  30 tablet     Refill:  1    Order Specific Question:   Supervising Provider    Answer:   Conan BowensDAVIS, KELLY M [[id number]]   Care taken over by Donette LarryMelanie Kim Oki CNM @2030    Sharyon CableVeronica C Rogers CNM 04/09/2018, 8:30 PM  Feeling much better, tolerating po, no emesis. Has Zofran and Phenergan at home. Will add Pepcid and Scopolamine. Stable for discharge home.   Assessment and Plan   1. [redacted] weeks gestation of pregnancy   2. Morning sickness   3. Dehydration    Discharge home Follow up in OB office as scheduled Hydrate Rx Pepcid Rx Scopolamine  Allergies as of 04/09/2018   No Known Allergies     Medication List    TAKE these medications   albuterol (2.5 MG/3ML) 0.083% nebulizer solution Commonly known as:  PROVENTIL Take 3 mLs (2.5 mg total) by nebulization every 6 (six) hours as needed for wheezing or shortness of breath.   famotidine 20 MG tablet Commonly known as:  PEPCID Take 1 tablet (20 mg total) by mouth daily.   glycopyrrolate 1 MG tablet Commonly known as:  ROBINUL Take 1 tablet (1 mg total) by mouth 3 (three) times daily as needed.   promethazine 25 MG tablet Commonly known as:  PHENERGAN Take 1 tablet (25 mg total) by mouth every 6 (six) hours as needed for nausea or vomiting.   scopolamine 1 MG/3DAYS Commonly known as:  TRANSDERM-SCOP Place 1 patch (1.5 mg total) onto the skin every 3 (three) days. Start taking on:  April 12, 2018      Donette LarryBhambri, Kaesha Kirsch, [place]  04/09/2018 10:49 PM

## 2018-05-02 ENCOUNTER — Encounter (HOSPITAL_BASED_OUTPATIENT_CLINIC_OR_DEPARTMENT_OTHER): Payer: Self-pay | Admitting: *Deleted

## 2018-05-02 ENCOUNTER — Emergency Department (HOSPITAL_BASED_OUTPATIENT_CLINIC_OR_DEPARTMENT_OTHER)
Admission: EM | Admit: 2018-05-02 | Discharge: 2018-05-02 | Disposition: A | Discharge status: Home / Self Care | Payer: Medicaid Other | Attending: Emergency Medicine | Admitting: Emergency Medicine

## 2018-05-02 ENCOUNTER — Other Ambulatory Visit: Payer: Self-pay

## 2018-05-02 DIAGNOSIS — Z87891 Personal history of nicotine dependence: Secondary | ICD-10-CM | POA: Diagnosis not present

## 2018-05-02 DIAGNOSIS — J45901 Unspecified asthma with (acute) exacerbation: Secondary | ICD-10-CM | POA: Diagnosis not present

## 2018-05-02 DIAGNOSIS — R0602 Shortness of breath: Secondary | ICD-10-CM | POA: Diagnosis present

## 2018-05-02 MED ORDER — ALBUTEROL SULFATE HFA 108 (90 BASE) MCG/ACT IN AERS
INHALATION_SPRAY | RESPIRATORY_TRACT | Status: AC
  Filled 2018-05-02: qty 6.7

## 2018-05-02 MED ORDER — PREDNISONE 20 MG PO TABS: 40.0000 mg | ORAL_TABLET | Freq: Every day | ORAL | 0 refills | Status: AC

## 2018-05-02 MED ORDER — ALBUTEROL SULFATE HFA 108 (90 BASE) MCG/ACT IN AERS
2.0000 | INHALATION_SPRAY | Freq: Once | RESPIRATORY_TRACT | Status: AC
  Administered 2018-05-02: 2 via RESPIRATORY_TRACT

## 2018-05-02 MED ORDER — ALBUTEROL SULFATE (2.5 MG/3ML) 0.083% IN NEBU
INHALATION_SOLUTION | RESPIRATORY_TRACT | Status: AC
  Administered 2018-05-02: 2.5 mg
  Filled 2018-05-02: qty 3

## 2018-05-02 MED ORDER — IPRATROPIUM-ALBUTEROL 0.5-2.5 (3) MG/3ML IN SOLN
RESPIRATORY_TRACT | Status: AC
  Administered 2018-05-02: 3 mL
  Filled 2018-05-02: qty 3

## 2018-05-02 MED ORDER — PREDNISONE 20 MG PO TABS
40.0000 mg | ORAL_TABLET | Freq: Once | ORAL | Status: AC
  Administered 2018-05-02: 40 mg via ORAL
  Filled 2018-05-02: qty 2

## 2018-05-02 NOTE — ED Notes (Signed)
NAD at this time. Pt is stable and going home.  

## 2018-05-02 NOTE — Discharge Instructions (Signed)
Based on your description of symptoms we suspect you have an asthma attack that was worsened by both the environmental seasonal changes and the viral cold we suspect you have.  We discussed obtaining chest x-ray however due to your pregnancy, we had a shared decision-making conversation and agreed to hold on radiating imaging at this time.  If symptoms change or worsen, please return to the nearest emergency department as she will likely need further treatments and x-ray to rule out pneumonia.  Please use the steroids for the next 4 days starting tomorrow as you already received today's dose.  Please use your inhaler.  Please follow-up with your OB/GYN and PCP for further management.  If any symptoms change or worsen, please return to the nearest emergency department.

## 2018-05-02 NOTE — ED Notes (Signed)
ED Provider at bedside. 

## 2018-05-02 NOTE — ED Provider Notes (Signed)
[facility] EMERGENCY DEPARTMENT Provider Note   CSN: [id number] Arrival date & time: 05/02/18  0947     History   Chief Complaint Chief Complaint  Patient presents with  . Shortness of Breath    HPI Gail Walker is a 28 y.o. female.  The history is provided by the patient and medical records. No language interpreter was used.  Shortness of Breath  Severity:  Moderate Onset quality:  Gradual Duration:  3 days Timing:  Constant Progression:  Waxing and waning Chronicity:  Recurrent Context: URI   Relieved by:  Inhaler (ran out) Worsened by:  Nothing Ineffective treatments:  None tried Associated symptoms: cough and wheezing   Associated symptoms: no abdominal pain, no chest pain, no diaphoresis, no fever, no headaches, no neck pain, no rash, no sputum production and no vomiting     Past Medical History:  Diagnosis Date  . Acne   . Asthma    uses albuterol inhaler daily  . CAP (community acquired pneumonia) 11/14/2015  . Chlamydia 2012  . GERD (gastroesophageal reflux disease)    during pregnancy - takes zantac    Patient Active Problem List   Diagnosis Date Noted  . Morning sickness 03/12/2018  . Depression 11/14/2011  . Acne 04/26/2011  . TOBACCO USER 12/27/2008  . OBESITY, NOS 06/15/2006  . RHINITIS, ALLERGIC 06/15/2006  . Asthma 06/15/2006  . ECZEMA, ATOPIC DERMATITIS 06/15/2006    Past Surgical History:  Procedure Laterality Date  . pregnancy termination       OB History    Gravida  3   Para  1   Term  1   Preterm  0   AB  1   Living  1     SAB  0   TAB  1   Ectopic  0   Multiple  0   Live Births  1            Home Medications    Prior to Admission medications   Medication Sig Start Date End Date Taking? Authorizing Provider  albuterol (PROVENTIL) (2.5 MG/3ML) 0.083% nebulizer solution Take 3 mLs (2.5 mg total) by nebulization every 6 (six) hours as needed for wheezing or shortness of breath. 01/13/18    Elson Areas, PA-C  famotidine (PEPCID) 20 MG tablet Take 1 tablet (20 mg total) by mouth daily. 04/09/18   Donette Larry, CNM  glycopyrrolate (ROBINUL) 1 MG tablet Take 1 tablet (1 mg total) by mouth 3 (three) times daily as needed. 03/30/18   Sharyon Cable, CNM  promethazine (PHENERGAN) 25 MG tablet Take 1 tablet (25 mg total) by mouth every 6 (six) hours as needed for nausea or vomiting. 03/30/18   Sharyon Cable, CNM  scopolamine (TRANSDERM-SCOP) 1 MG/3DAYS Place 1 patch (1.5 mg total) onto the skin every 3 (three) days. 04/12/18   Donette Larry, CNM    Family History Family History  Problem Relation Age of Onset  . Diabetes Father   . Cancer Maternal Aunt   . Anesthesia problems Neg Hx   . Hearing loss Neg Hx   . Stroke Neg Hx   . CAD Neg Hx     Social History Social History   Tobacco Use  . Smoking status: Former Smoker    Packs/day: 0.25    Years: 3.00    Pack years: 0.75    Types: Cigarettes    Last attempt to quit: 02/07/2018    Years since quitting: 0.2  . Smokeless tobacco:  Never Used  Substance Use Topics  . Alcohol use: No  . Drug use: No     Allergies   Patient has no known allergies.   Review of Systems Review of Systems  Constitutional: Negative for chills, diaphoresis, fatigue and fever.  HENT: Positive for congestion. Negative for dental problem and rhinorrhea.   Eyes: Negative for visual disturbance.  Respiratory: Positive for cough, shortness of breath and wheezing. Negative for sputum production, chest tightness and stridor.   Cardiovascular: Negative for chest pain, palpitations and leg swelling.  Gastrointestinal: Negative for abdominal pain, constipation, diarrhea, nausea and vomiting.  Genitourinary: Negative for dysuria and flank pain.  Musculoskeletal: Negative for back pain, neck pain and neck stiffness.  Skin: Negative for rash and wound.  Neurological: Negative for light-headedness and headaches.    Psychiatric/Behavioral: Negative for agitation and confusion.  All other systems reviewed and are negative.    Physical Exam Updated Vital Signs BP 134/64 (BP Location: Left Arm)   Pulse (!) 121   Temp 98.1 F (36.7 C) (Oral)   Resp (!) 22   LMP  (LMP Unknown)   SpO2 99%   Physical Exam Vitals signs and nursing note reviewed.  Constitutional:      General: She is not in acute distress.    Appearance: She is well-developed. She is not ill-appearing, toxic-appearing or diaphoretic.  HENT:     Head: Normocephalic and atraumatic.  Eyes:     Conjunctiva/sclera: Conjunctivae normal.     Pupils: Pupils are equal, round, and reactive to light.  Neck:     Musculoskeletal: Normal range of motion and neck supple.  Cardiovascular:     Rate and Rhythm: Normal rate and regular rhythm.     Pulses: Normal pulses.     Heart sounds: No murmur.  Pulmonary:     Effort: Pulmonary effort is normal. No tachypnea or respiratory distress.     Breath sounds: Wheezing present. No decreased breath sounds, rhonchi or rales.  Chest:     Chest wall: No tenderness.  Abdominal:     Palpations: Abdomen is soft.     Tenderness: There is no abdominal tenderness.  Musculoskeletal:     Right lower leg: She exhibits no tenderness. No edema.     Left lower leg: She exhibits no tenderness. No edema.  Skin:    General: Skin is warm and dry.  Neurological:     General: No focal deficit present.     Mental Status: She is alert.      ED Treatments / Results  Labs (all labs ordered are listed, but only abnormal results are displayed) Labs Reviewed - No data to display  EKG None  Radiology No results found.  Procedures Procedures (including critical care time)  Medications Ordered in ED Medications  albuterol (PROVENTIL) (2.5 MG/3ML) 0.083% nebulizer solution (2.5 mg  Given 05/02/18 1004)  ipratropium-albuterol (DUONEB) 0.5-2.5 (3) MG/3ML nebulizer solution (3 mLs  Given 05/02/18 1001)      Initial Impression / Assessment and Plan / ED Course  I have reviewed the triage vital signs and the nursing notes.  Pertinent labs & imaging results that were available during my care of the patient were reviewed by me and considered in my medical decision making (see chart for details).     Gail Walker is a 28 y.o. female who reports that she is 5 months pregnant and has history of asthma who presents with shortness of breath, rhinorrhea, congestion, and dry cough.  Patient reports  that she has had a URI for the last several days and this is exacerbated her asthma.  She reports that he is run out of her asthma inhaler.  She says that she is had no chest pain or chest tightness.  Only some mild to moderate shortness of breath with the wheezing.  She reports is not been having any hemoptysis or any coughing up anything.  She denies any leg pain, leg swelling, or other complaints.  No fevers or chills.  No nausea or vomiting she is tolerating p.o. normally.  No complications with her pregnancy and no other recent medication changes.  On exam, patient had mild wheezing.  This is after patient already received a nebulized inhaler treatment in triage.  Chest was nontender, abdomen was nontender.  Back was nontender.  Patient resting comfortably.  Patient has good oxygen saturations on room air.  Normal pulses in upper and lower extremities.  No lower extremity edema seen.  Sure decision-making conversation held patient as to further management.  Given her lack of fevers, chills, or chest pain, have low suspicion for pneumonia given the lack of rhonchi on exam.  Low suspicion for DVT or PE given lack of leg pain, leg swelling, or chest pain.  Suspect asthma exacerbation in setting of URI and seasonal weather changes.  Pharmacy was called who confirmed patient could have steroids for the neck several days and patient was given a dose of prednisone.  Patient also felt better after the albuterol.   Patient be given inhaler to treat at home and will follow-up with her PCP and OB/GYN team.  Patient was feeling better and voiced understanding of plan of care.  Patient understood return precautions.  Patient other questions or concerns and was discharged in good condition with improved symptoms.   Final Clinical Impressions(s) / ED Diagnoses   Final diagnoses:  Exacerbation of asthma, unspecified asthma severity, unspecified whether persistent    ED Discharge Orders         Ordered    predniSONE (DELTASONE) 20 MG tablet  Daily     05/02/18 1213           Clinical Impression: 1. Exacerbation of asthma, unspecified asthma severity, unspecified whether persistent     Disposition: Discharge  Condition: Good  I have discussed the results, Dx and Tx plan with the pt(& family if present). He/she/they expressed understanding and agree(s) with the plan. Discharge instructions discussed at great length. Strict return precautions discussed and pt &/or family have verbalized understanding of the instructions. No further questions at time of discharge.    New Prescriptions   PREDNISONE (DELTASONE) 20 MG TABLET    Take 2 tablets (40 mg total) by mouth daily for 4 days.    Follow Up: [facility] [address] [place] [address] [phone] Schedule an appointment as soon as possible for a visit    [facility] [address] [id number] [place] [address] [address] [phone]       Hollyann Pablo, Canary Brim, MD 05/02/18 1530

## 2018-05-02 NOTE — ED Triage Notes (Signed)
Shortness of breath x 2 days, non productive cough,  Inhaler not helping  Pt is preg

## 2018-05-11 ENCOUNTER — Other Ambulatory Visit: Payer: Self-pay | Admitting: Family Medicine

## 2018-05-14 ENCOUNTER — Inpatient Hospital Stay (HOSPITAL_COMMUNITY)
Admission: AD | Admit: 2018-05-14 | Discharge: 2018-05-14 | Disposition: A | Discharge status: Home / Self Care | Payer: Medicaid Other | Source: Ambulatory Visit | Attending: Obstetrics and Gynecology | Admitting: Obstetrics and Gynecology

## 2018-05-14 ENCOUNTER — Other Ambulatory Visit: Payer: Self-pay

## 2018-05-14 ENCOUNTER — Inpatient Hospital Stay (HOSPITAL_BASED_OUTPATIENT_CLINIC_OR_DEPARTMENT_OTHER): Payer: Medicaid Other

## 2018-05-14 ENCOUNTER — Encounter (HOSPITAL_COMMUNITY): Payer: Self-pay

## 2018-05-14 DIAGNOSIS — Z87891 Personal history of nicotine dependence: Secondary | ICD-10-CM | POA: Diagnosis not present

## 2018-05-14 DIAGNOSIS — O021 Missed abortion: Secondary | ICD-10-CM

## 2018-05-14 DIAGNOSIS — R109 Unspecified abdominal pain: Secondary | ICD-10-CM | POA: Insufficient documentation

## 2018-05-14 DIAGNOSIS — IMO0002 Reserved for concepts with insufficient information to code with codable children: Secondary | ICD-10-CM

## 2018-05-14 DIAGNOSIS — Z3A17 17 weeks gestation of pregnancy: Secondary | ICD-10-CM

## 2018-05-14 DIAGNOSIS — O26892 Other specified pregnancy related conditions, second trimester: Secondary | ICD-10-CM

## 2018-05-14 DIAGNOSIS — O4692 Antepartum hemorrhage, unspecified, second trimester: Secondary | ICD-10-CM | POA: Diagnosis not present

## 2018-05-14 LAB — URINALYSIS, ROUTINE W REFLEX MICROSCOPIC
Bilirubin Urine: NEGATIVE
Glucose, UA: NEGATIVE mg/dL
Hgb urine dipstick: NEGATIVE
Ketones, ur: NEGATIVE mg/dL
Leukocytes, UA: NEGATIVE
Nitrite: NEGATIVE
Protein, ur: 30 mg/dL — AB
Specific Gravity, Urine: 1.033 — ABNORMAL HIGH (ref 1.005–1.030)
pH: 6 (ref 5.0–8.0)

## 2018-05-14 NOTE — MAU Note (Signed)
Pt reports lower abd cramping since yesterday, some spotting. Denies dysuria

## 2018-05-14 NOTE — Discharge Instructions (Signed)
Return to care   If you have heavier bleeding that soaks through more that 2 pads per hour for an hour or more  If you bleed so much that you feel like you might pass out or you do pass out  If you have significant abdominal pain that is not improved with Tylenol   If you develop a fever > 100.5      Coping with Pregnancy Loss Pregnancy loss can happen any time during a pregnancy. Often the cause is not known. It is rarely because of anything you did. Pregnancy loss in early pregnancy (during the first trimester) is called a miscarriage. This type of pregnancy loss is the most common. Pregnancy loss that happens after 20 weeks of pregnancy is called fetal demise if the baby's heart stops beating before birth. Fetal demise is much less common. Some women experience spontaneous labor shortly after fetal demise resulting in a stillborn birth (stillbirth). Any pregnancy loss can be devastating. You will need to recover both physically and emotionally. Most women are able to get pregnant again after a pregnancy loss and deliver a healthy baby. How to manage emotional recovery  Pregnancy loss is very hard emotionally. You may feel many different emotions while you grieve. You may feel sad and angry. You may also feel guilty. It is normal to have periods of crying. Emotional recovery can take longer than physical recovery. It is different for everyone. Taking these steps can help you cope:  Remember that it is unlikely you did anything to cause the pregnancy loss.  Share your thoughts and feelings with friends, family, and your partner. Remember that your partner is also recovering emotionally.  Make sure you have a good support system, and do not spend too much time alone.  Meet with a pregnancy loss counselor or join a pregnancy loss support group.  Get enough sleep and eat a healthy diet. Return to regular exercise when you have recovered physically.  Do not use drugs or alcohol to manage  your emotions.  Consider seeing a mental health professional to help you recover emotionally.  Ask a friend or loved one to help you decide what to do with any clothing and nursery items you received for your baby. In the case of a stillbirth, many women benefit from taking additional steps in the grieving process. You may want to:  Hold your baby after the birth.  Name your baby.  Request a birth certificate.  Create a keepsake such as handprints or footprints.  Dress your baby and have a picture taken.  Make funeral arrangements.  Ask for a baptism or blessing. Hospitals have staff members who can help you with all these arrangements. How to recognize emotional stress It is normal to have emotional stress after a pregnancy loss. But emotional stress that lasts a long time or becomes severe requires treatment. Watch out for these signs of severe emotional stress:  Sadness, anger, or guilt that is not going away and is interfering with your normal activities.  Relationship problems that have occurred or gotten worse since the pregnancy loss.  Signs of depression that last longer than 2 weeks. These may include: ? Sadness. ? Anxiety. ? Hopelessness. ? Loss of interest in activities you enjoy. ? Inability to concentrate. ? Trouble sleeping or sleeping too much. ? Loss of appetite or overeating. ? Thoughts of death or of hurting yourself. Follow these instructions at home: Medicines  Take over-the-counter and prescription medicines only as told by your  health care provider. Activity  Rest at home until your energy level returns. Return to your normal activities as told by your health care provider. Ask your health care provider what activities are safe for you. General instructions  Keep all follow-up visits as told by your health care provider. This is important.  It may be helpful to meet with others who have experienced pregnancy loss. Ask your health care provider  about support groups and resources.  To help you and your partner with the process of grieving, talk with your health care provider or seek counseling.  When you are ready, meet with your health care provider to discuss steps to take for a future pregnancy. Where to find more information  U.S. Department of Health and CytogeneticistHuman Services Office on Women's Health: http://hoffman.com/www.womenshealth.gov  American Pregnancy Association: www.americanpregnancy.org Contact a health care provider if:  You continue to experience grief, sadness, or lack of motivation for everyday activities, and those feelings do not improve over time.  You are struggling to recover emotionally, especially if you are using alcohol or substances to help. Get help right away if:  You have thoughts of hurting yourself or others. If you ever feel like you may hurt yourself or others, or have thoughts about taking your own life, get help right away. You can go to your nearest emergency department or call:  Your local emergency services (911 in the [place]  A suicide crisis helpline, such as the National Suicide Prevention Lifeline at 865-541-36941-931-316-0968. This is open 24 hours a day. Summary  Any pregnancy loss can be difficult physically and emotionally.  You may experience many different emotions while you grieve. Emotional recovery can last longer than physical recovery.  It is normal to have emotional stress after a pregnancy loss. But emotional stress that lasts a long time or becomes severe requires treatment.  See your health care provider if you are struggling emotionally after a pregnancy loss. This information is not intended to replace advice given to you by your health care provider. Make sure you discuss any questions you have with your health care provider. Document Released: 06/15/2017 Document Revised: 06/15/2017 Document Reviewed: 06/15/2017 Elsevier Interactive Patient Education  2019 [employer]  Miscarriage A  miscarriage is the loss of an unborn baby (fetus) before the 20th week of pregnancy. Most miscarriages happen during the first 3 months of pregnancy. Sometimes, a miscarriage can happen before a woman knows that she is pregnant. Having a miscarriage can be an emotional experience. If you have had a miscarriage, talk with your health care provider about any questions you may have about miscarrying, the grieving process, and your plans for future pregnancy. What are the causes? A miscarriage may be caused by:  Problems with the genes or chromosomes of the fetus. These problems make it impossible for the baby to develop normally. They are often the result of random errors that occur early in the development of the baby, and are not passed from parent to child (not inherited).  Infection of the cervix or uterus.  Conditions that affect hormone balance in the body.  Problems with the cervix, such as the cervix opening and thinning before pregnancy is at term (cervical insufficiency).  Problems with the uterus. These may include: ? A uterus with an abnormal shape. ? Fibroids in the uterus. ? Congenital abnormalities. These are problems that were present at birth.  Certain medical conditions.  Smoking, drinking alcohol, or using drugs.  Injury (trauma). In many cases, the  cause of a miscarriage is not known. What are the signs or symptoms? Symptoms of this condition include:  Vaginal bleeding or spotting, with or without cramps or pain.  Pain or cramping in the abdomen or lower back.  Passing fluid, tissue, or blood clots from the vagina. How is this diagnosed? This condition may be diagnosed based on:  A physical exam.  Ultrasound.  Blood tests.  Urine tests. How is this treated? Treatment for a miscarriage is sometimes not necessary if you naturally pass all the tissue that was in your uterus. If necessary, this condition may be treated with:  Dilation and curettage (D&C).  This is a procedure in which the cervix is stretched open and the lining of the uterus (endometrium) is scraped. This is done only if tissue from the fetus or placenta remains in the body (incomplete miscarriage).  Medicines, such as: ? Antibiotic medicine, to treat infection. ? Medicine to help the body pass any remaining tissue. ? Medicine to reduce (contract) the size of the uterus. These medicines may be given if you have a lot of bleeding. If you have Rh negative blood and your baby was Rh positive, you will need a shot of a medicine called Rh immunoglobulinto protect your future babies from Rh blood problems. "Rh-negative" and "Rh-positive" refer to whether or not the blood has a specific protein found on the surface of red blood cells (Rh factor). Follow these instructions at home: Medicines   Take over-the-counter and prescription medicines only as told by your health care provider.  If you were prescribed antibiotic medicine, take it as told by your health care provider. Do not stop taking the antibiotic even if you start to feel better.  Do not take NSAIDs, such as aspirin and ibuprofen, unless they are approved by your health care provider. These medicines can cause bleeding. Activity  Rest as directed. Ask your health care provider what activities are safe for you.  Have someone help with home and family responsibilities during this time. General instructions  Keep track of the number of sanitary pads you use each day and how soaked (saturated) they are. Write down this information.  Monitor the amount of tissue or blood clots that you pass from your vagina. Save any large amounts of tissue for your health care provider to examine.  Do not use tampons, douche, or have sex until your health care provider approves.  To help you and your partner with the process of grieving, talk with your health care provider or seek counseling.  When you are ready, meet with your health care  provider to discuss any important steps you should take for your health. Also, discuss steps you should take to have a healthy pregnancy in the future.  Keep all follow-up visits as told by your health care provider. This is important. Where to find more information  The American Congress of Obstetricians and Gynecologists: www.acog.org  [place]. Department of Health and CytogeneticistHuman Services Office of Womens Health: http://hoffman.com/www.womenshealth.gov Contact a health care provider if:  You have a fever or chills.  You have a foul smelling vaginal discharge.  You have more bleeding instead of less. Get help right away if:  You have severe cramps or pain in your back or abdomen.  You pass blood clots or tissue from your vagina that is walnut-sized or larger.  You soak more than 1 regular sanitary pad in an hour.  You become light-headed or weak.  You pass out.  You have  feelings of sadness that take over your thoughts, or you have thoughts of hurting yourself. Summary  Most miscarriages happen in the first 3 months of pregnancy. Sometimes miscarriage happens before a woman even knows that she is pregnant.  Follow your health care provider's instruction for home care. Keep all follow-up appointments.  To help you and your partner with the process of grieving, talk with your health care provider or seek counseling. This information is not intended to replace advice given to you by your health care provider. Make sure you discuss any questions you have with your health care provider. Document Released: 09/28/2000 Document Revised: 05/10/2016 Document Reviewed: 05/10/2016 Elsevier Interactive Patient Education  2019 [employer].

## 2018-05-14 NOTE — MAU Provider Note (Signed)
Chief Complaint: Abdominal Pain   First Provider Initiated Contact with Patient 05/14/18 1608     SUBJECTIVE HPI: Gail Walker is a 28 y.o. G3P1011 at 4952w4d who presents to [facility] reporting abdominal pain. Is scheduled for new ob intake in clinic tomorrow.  Reports intermittent abdominal cramping since yesterday. Had some pink spotting over the weekend but not bleeding since then. Denies LOF. No n/v/d, constipation, or recent intercourse.   Location: abdomen Quality: cramping Severity: 6/10 on pain scale Duration: 1 day Timing: intermittent Modifying factors: none Associated signs and symptoms: spotting over the weekend  Past Medical History:  Diagnosis Date  . Acne   . Asthma    uses albuterol inhaler daily  . CAP (community acquired pneumonia) 11/14/2015  . Chlamydia 2012  . GERD (gastroesophageal reflux disease)    during pregnancy - takes zantac   OB History  Gravida Para Term Preterm AB Living  3 1 1  0 1 1  SAB TAB Ectopic Multiple Live Births  0 1 0 0 1    # Outcome Date GA Lbr Len/2nd Weight Sex Delivery Anes PTL Lv  3 Current           2 Term 07/11/11 7128w0d 19:42 / 02:26 3500 g M Vag-Spont EPI  LIV  1 TAB 07/2010           Past Surgical History:  Procedure Laterality Date  . pregnancy termination     Social History   Socioeconomic History  . Marital status: Single    Spouse name: Not on file  . Number of children: Not on file  . Years of education: Not on file  . Highest education level: Not on file  Occupational History  . 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 Financial resource strain: Not on file  . Food insecurity:    Worry: Not on file    Inability: Not on file  . Transportation needs:    Medical: Not on file    Non-medical: Not on file  Tobacco Use  . Smoking status: Former Smoker    Packs/day: 0.25    Years: 3.00    Pack years: 0.75    Types: Cigarettes    Last attempt to quit: 02/07/2018    Years  since quitting: 0.2  . Smokeless tobacco: Never Used  Substance and Sexual Activity  . Alcohol use: No  . Drug use: No  . Sexual activity: Yes    Birth control/protection: None  Lifestyle  . Physical activity:    Days per week: Not on file    Minutes per session: Not on file  . Stress: Not on file  Relationships  . Social connections:    Talks on phone: Not on file    Gets together: Not on file    Attends religious service: Not on file    Active member of club or organization: Not on file    Attends meetings of clubs or organizations: Not on file    Relationship status: Not on file  . Intimate partner violence:    Fear of current or ex partner: Not on file    Emotionally abused: Not on file    Physically abused: Not on file    Forced sexual activity: Not on file  Other Topics Concern  . Not on file  Social History Narrative  . Not on file   Family History  Problem Relation Age of Onset  . Diabetes Father   . Cancer Maternal  Aunt   . Anesthesia problems Neg Hx   . Hearing loss Neg Hx   . Stroke Neg Hx   . CAD Neg Hx    No current facility-administered medications on file prior to encounter.    Current Outpatient Medications on File Prior to Encounter  Medication Sig Dispense Refill  . albuterol (PROVENTIL) (2.5 MG/3ML) 0.083% nebulizer solution Take 3 mLs (2.5 mg total) by nebulization every 6 (six) hours as needed for wheezing or shortness of breath. 75 mL 3  . famotidine (PEPCID) 20 MG tablet Take 1 tablet (20 mg total) by mouth daily. 30 tablet 1  . glycopyrrolate (ROBINUL) 1 MG tablet Take 1 tablet (1 mg total) by mouth 3 (three) times daily as needed. 30 tablet 1  . promethazine (PHENERGAN) 25 MG tablet Take 1 tablet (25 mg total) by mouth every 6 (six) hours as needed for nausea or vomiting. 30 tablet 1  . scopolamine (TRANSDERM-SCOP) 1 MG/3DAYS Place 1 patch (1.5 mg total) onto the skin every 3 (three) days. 10 patch 2   No Known Allergies  I have reviewed  patient's Past Medical Hx, Surgical Hx, Family Hx, Social Hx, medications and allergies.   Review of Systems  Constitutional: Negative.   Gastrointestinal: Positive for abdominal pain. Negative for constipation, diarrhea, nausea and vomiting.  Genitourinary: Negative.     OBJECTIVE Patient Vitals for the past 24 hrs:  BP Temp Temp src Pulse Resp Height Weight  05/14/18 1735 139/89 - - (!) 110 18 - -  05/14/18 1554 138/82 98.4 F (36.9 C) Oral (!) 101 16 5\' 3"  (1.6 m) 113.9 kg   Constitutional: Well-developed, well-nourished female in no acute distress.  Cardiovascular: normal rate & rhythm, no murmur Respiratory: normal rate and effort. Lung sounds clear throughout GI: Abd soft, non-tender, Pos BS x 4. No guarding or rebound tenderness MS: Extremities nontender, no edema, normal ROM Neurologic: Alert and oriented x 4.     LAB RESULTS Results for orders placed or performed during the hospital encounter of 05/14/18 (from the past 24 hour(s))  Urinalysis, Routine w reflex microscopic     Status: Abnormal   Collection Time: 05/14/18  3:54 PM  Result Value Ref Range   Color, Urine YELLOW YELLOW   APPearance HAZY (A) CLEAR   Specific Gravity, Urine 1.033 (H) 1.005 - 1.030   pH 6.0 5.0 - 8.0   Glucose, UA NEGATIVE NEGATIVE mg/dL   Hgb urine dipstick NEGATIVE NEGATIVE   Bilirubin Urine NEGATIVE NEGATIVE   Ketones, ur NEGATIVE NEGATIVE mg/dL   Protein, ur 30 (A) NEGATIVE mg/dL   Nitrite NEGATIVE NEGATIVE   Leukocytes, UA NEGATIVE NEGATIVE   RBC / HPF 0-5 0 - 5 RBC/hpf   WBC, UA 0-5 0 - 5 WBC/hpf   Bacteria, UA RARE (A) NONE SEEN   Squamous Epithelial / LPF 11-20 0 - 5   Mucus PRESENT     IMAGING No results found.  MAU COURSE Orders Placed This Encounter  Procedures  . [place] MFM OB LIMITED  . Urinalysis, Routine w reflex microscopic  . Discharge patient   No orders of the defined types were placed in this encounter.   MDM Unable to doppler FHT Pt informed that the  ultrasound is considered a limited OB ultrasound and is not intended to be a complete ultrasound exam.  Patient also informed that the ultrasound is not being completed with the intent of assessing for fetal or placental anomalies or any pelvic abnormalities.  Explained that the  purpose of today's ultrasound is to assess for  viability.  Patient acknowledges the purpose of the exam and the limitations of the study.   IUP measuring <17 wks & no cardiac activity seen, will send for official ultrasound  Ultrasound shows IUP measuring 13 weeks with no cardiac activity  Discussed results of ultrasound with patient. [place] to [place].   ASSESSMENT 1. Missed abortion   2. Fetal heart tones not heard   3. [redacted] weeks gestation of pregnancy     PLAN Discharge home in stable condition. Bleeding & infection precautions  Allergies as of 05/14/2018   No Known Allergies     Medication List    TAKE these medications   albuterol (2.5 MG/3ML) 0.083% nebulizer solution Commonly known as:  PROVENTIL Take 3 mLs (2.5 mg total) by nebulization every 6 (six) hours as needed for wheezing or shortness of breath.   famotidine 20 MG tablet Commonly known as:  PEPCID Take 1 tablet (20 mg total) by mouth daily.   glycopyrrolate 1 MG tablet Commonly known as:  ROBINUL Take 1 tablet (1 mg total) by mouth 3 (three) times daily as needed.   promethazine 25 MG tablet Commonly known as:  PHENERGAN Take 1 tablet (25 mg total) by mouth every 6 (six) hours as needed for nausea or vomiting.   scopolamine 1 MG/3DAYS Commonly known as:  TRANSDERM-SCOP Place 1 patch (1.5 mg total) onto the skin every 3 (three) days.        Judeth HornLawrence, Deira Shimer, NP 05/14/2018  7:08 PM

## 2018-05-15 ENCOUNTER — Other Ambulatory Visit: Payer: Self-pay | Admitting: Obstetrics and Gynecology

## 2018-05-15 ENCOUNTER — Other Ambulatory Visit (HOSPITAL_COMMUNITY): Payer: Self-pay

## 2018-05-15 DIAGNOSIS — Z6841 Body Mass Index (BMI) 40.0 and over, adult: Secondary | ICD-10-CM | POA: Insufficient documentation

## 2018-05-15 DIAGNOSIS — Z8759 Personal history of other complications of pregnancy, childbirth and the puerperium: Secondary | ICD-10-CM | POA: Insufficient documentation

## 2018-05-15 DIAGNOSIS — O021 Missed abortion: Secondary | ICD-10-CM

## 2018-05-15 NOTE — Progress Notes (Signed)
Called the pt and asked her to come in at 0715 instead of 8am. She is agreeable.

## 2018-05-16 ENCOUNTER — Ambulatory Visit (HOSPITAL_COMMUNITY)
Admission: RE | Admit: 2018-05-16 | Payer: Medicaid Other | Source: Ambulatory Visit | Admitting: Obstetrics and Gynecology

## 2018-05-16 ENCOUNTER — Ambulatory Visit (HOSPITAL_BASED_OUTPATIENT_CLINIC_OR_DEPARTMENT_OTHER)
Admission: RE | Admit: 2018-05-16 | Discharge: 2018-05-16 | Disposition: A | Discharge status: Home / Self Care | Payer: Medicaid Other | Attending: Obstetrics and Gynecology | Admitting: Obstetrics and Gynecology

## 2018-05-16 ENCOUNTER — Encounter (HOSPITAL_BASED_OUTPATIENT_CLINIC_OR_DEPARTMENT_OTHER)
Admission: RE | Disposition: A | Discharge status: Home / Self Care | Payer: Self-pay | Source: Home / Self Care | Attending: Obstetrics and Gynecology

## 2018-05-16 ENCOUNTER — Ambulatory Visit (HOSPITAL_BASED_OUTPATIENT_CLINIC_OR_DEPARTMENT_OTHER): Payer: Medicaid Other | Admitting: Anesthesiology

## 2018-05-16 ENCOUNTER — Ambulatory Visit (HOSPITAL_COMMUNITY): Payer: Medicaid Other

## 2018-05-16 DIAGNOSIS — O021 Missed abortion: Secondary | ICD-10-CM | POA: Diagnosis present

## 2018-05-16 DIAGNOSIS — Z87891 Personal history of nicotine dependence: Secondary | ICD-10-CM | POA: Diagnosis not present

## 2018-05-16 DIAGNOSIS — O99211 Obesity complicating pregnancy, first trimester: Secondary | ICD-10-CM | POA: Diagnosis not present

## 2018-05-16 DIAGNOSIS — Z3A13 13 weeks gestation of pregnancy: Secondary | ICD-10-CM | POA: Insufficient documentation

## 2018-05-16 DIAGNOSIS — O26891 Other specified pregnancy related conditions, first trimester: Secondary | ICD-10-CM | POA: Diagnosis not present

## 2018-05-16 DIAGNOSIS — J45909 Unspecified asthma, uncomplicated: Secondary | ICD-10-CM | POA: Insufficient documentation

## 2018-05-16 DIAGNOSIS — Z9889 Other specified postprocedural states: Secondary | ICD-10-CM

## 2018-05-16 HISTORY — PX: OPERATIVE ULTRASOUND: SHX5996

## 2018-05-16 HISTORY — PX: DILATION AND EVACUATION: SHX1459

## 2018-05-16 LAB — TYPE AND SCREEN
ABO/RH(D): A NEG
Antibody Screen: POSITIVE

## 2018-05-16 LAB — CBC
HCT: 38.1 % (ref 36.0–46.0)
Hemoglobin: 12.6 g/dL (ref 12.0–15.0)
MCH: 27 pg (ref 26.0–34.0)
MCHC: 33.1 g/dL (ref 30.0–36.0)
MCV: 81.6 fL (ref 80.0–100.0)
NRBC: 0 % (ref 0.0–0.2)
Platelets: 298 10*3/uL (ref 150–400)
RBC: 4.67 MIL/uL (ref 3.87–5.11)
RDW: 13.4 % (ref 11.5–15.5)
WBC: 6.8 10*3/uL (ref 4.0–10.5)

## 2018-05-16 SURGERY — DILATION AND EVACUATION, UTERUS
Anesthesia: General | Site: Uterus

## 2018-05-16 MED ORDER — OXYTOCIN 10 UNIT/ML IJ SOLN
INTRAMUSCULAR | Status: AC
  Filled 2018-05-16: qty 2

## 2018-05-16 MED ORDER — LACTATED RINGERS IV SOLN
INTRAVENOUS | Status: DC
  Administered 2018-05-16 (×2): via INTRAVENOUS

## 2018-05-16 MED ORDER — DEXAMETHASONE SODIUM PHOSPHATE 10 MG/ML IJ SOLN
INTRAMUSCULAR | Status: DC | PRN
  Administered 2018-05-16: 5 mg via INTRAVENOUS

## 2018-05-16 MED ORDER — SODIUM CHLORIDE 0.9 % IV SOLN
100.0000 mg | Freq: Once | INTRAVENOUS | Status: AC
  Administered 2018-05-16 (×2): 100 mg via INTRAVENOUS
  Filled 2018-05-16: qty 100

## 2018-05-16 MED ORDER — MIDAZOLAM HCL 5 MG/5ML IJ SOLN
INTRAMUSCULAR | Status: DC | PRN
  Administered 2018-05-16: 2 mg via INTRAVENOUS

## 2018-05-16 MED ORDER — FENTANYL CITRATE (PF) 100 MCG/2ML IJ SOLN
25.0000 ug | INTRAMUSCULAR | Status: DC | PRN
  Administered 2018-05-16 (×2): 50 ug via INTRAVENOUS

## 2018-05-16 MED ORDER — LIDOCAINE 2% (20 MG/ML) 5 ML SYRINGE
INTRAMUSCULAR | Status: AC
  Filled 2018-05-16: qty 5

## 2018-05-16 MED ORDER — LIDOCAINE HCL (PF) 1 % IJ SOLN
INTRAMUSCULAR | Status: AC
  Filled 2018-05-16: qty 30

## 2018-05-16 MED ORDER — FENTANYL CITRATE (PF) 100 MCG/2ML IJ SOLN
INTRAMUSCULAR | Status: AC
  Filled 2018-05-16: qty 2

## 2018-05-16 MED ORDER — MIDAZOLAM HCL 2 MG/2ML IJ SOLN: 1.0000 mg | INTRAMUSCULAR | Status: DC | PRN

## 2018-05-16 MED ORDER — FENTANYL CITRATE (PF) 100 MCG/2ML IJ SOLN
INTRAMUSCULAR | Status: DC | PRN
  Administered 2018-05-16: 50 ug via INTRAVENOUS

## 2018-05-16 MED ORDER — FENTANYL CITRATE (PF) 100 MCG/2ML IJ SOLN: 50.0000 ug | INTRAMUSCULAR | Status: DC | PRN

## 2018-05-16 MED ORDER — SUCCINYLCHOLINE CHLORIDE 200 MG/10ML IV SOSY
PREFILLED_SYRINGE | INTRAVENOUS | Status: DC | PRN
  Administered 2018-05-16: 180 mg via INTRAVENOUS

## 2018-05-16 MED ORDER — LIDOCAINE HCL 1 % IJ SOLN
INTRAMUSCULAR | Status: DC | PRN
  Administered 2018-05-16: 20 mL

## 2018-05-16 MED ORDER — PROPOFOL 10 MG/ML IV BOLUS
INTRAVENOUS | Status: AC
  Filled 2018-05-16: qty 20

## 2018-05-16 MED ORDER — RHO D IMMUNE GLOBULIN 1500 UNIT/2ML IJ SOSY
300.0000 ug | PREFILLED_SYRINGE | Freq: Once | INTRAMUSCULAR | Status: AC
  Administered 2018-05-16: 300 ug via INTRAVENOUS

## 2018-05-16 MED ORDER — ONDANSETRON HCL 4 MG/2ML IJ SOLN
INTRAMUSCULAR | Status: DC | PRN
  Administered 2018-05-16: 4 mg via INTRAVENOUS

## 2018-05-16 MED ORDER — ONDANSETRON HCL 4 MG/2ML IJ SOLN
INTRAMUSCULAR | Status: AC
  Filled 2018-05-16: qty 2

## 2018-05-16 MED ORDER — LIDOCAINE HCL (CARDIAC) PF 100 MG/5ML IV SOSY
PREFILLED_SYRINGE | INTRAVENOUS | Status: DC | PRN
  Administered 2018-05-16: 100 mg via INTRAVENOUS

## 2018-05-16 MED ORDER — KETOROLAC TROMETHAMINE 30 MG/ML IJ SOLN
INTRAMUSCULAR | Status: AC
  Filled 2018-05-16: qty 1

## 2018-05-16 MED ORDER — SCOPOLAMINE 1 MG/3DAYS TD PT72: 1.0000 | MEDICATED_PATCH | Freq: Once | TRANSDERMAL | Status: DC | PRN

## 2018-05-16 MED ORDER — METHYLERGONOVINE MALEATE 0.2 MG/ML IJ SOLN
INTRAMUSCULAR | Status: DC | PRN
  Administered 2018-05-16: 0.2 mg via INTRAMUSCULAR

## 2018-05-16 MED ORDER — LACTATED RINGERS IV SOLN: INTRAVENOUS | Status: DC

## 2018-05-16 MED ORDER — SUCCINYLCHOLINE CHLORIDE 200 MG/10ML IV SOSY
PREFILLED_SYRINGE | INTRAVENOUS | Status: AC
  Filled 2018-05-16: qty 10

## 2018-05-16 MED ORDER — MIDAZOLAM HCL 2 MG/2ML IJ SOLN
INTRAMUSCULAR | Status: AC
  Filled 2018-05-16: qty 2

## 2018-05-16 MED ORDER — SOD CITRATE-CITRIC ACID 500-334 MG/5ML PO SOLN
30.0000 mL | ORAL | Status: AC
  Administered 2018-05-16: 30 mL via ORAL

## 2018-05-16 MED ORDER — PROPOFOL 10 MG/ML IV BOLUS
INTRAVENOUS | Status: DC | PRN
  Administered 2018-05-16: 150 mg via INTRAVENOUS

## 2018-05-16 MED ORDER — ALBUTEROL SULFATE HFA 108 (90 BASE) MCG/ACT IN AERS
INHALATION_SPRAY | RESPIRATORY_TRACT | Status: DC | PRN
  Administered 2018-05-16 (×2): 2 via RESPIRATORY_TRACT

## 2018-05-16 MED ORDER — METHYLERGONOVINE MALEATE 0.2 MG/ML IJ SOLN
INTRAMUSCULAR | Status: AC
  Filled 2018-05-16: qty 1

## 2018-05-16 MED ORDER — KETOROLAC TROMETHAMINE 30 MG/ML IJ SOLN
INTRAMUSCULAR | Status: DC | PRN
  Administered 2018-05-16: 30 mg via INTRAVENOUS

## 2018-05-16 MED ORDER — OXYCODONE-ACETAMINOPHEN 5-325 MG PO TABS: 1.0000 | ORAL_TABLET | Freq: Four times a day (QID) | ORAL | 0 refills | Status: DC | PRN

## 2018-05-16 MED ORDER — SODIUM CHLORIDE 0.9 % IV SOLN
INTRAVENOUS | Status: DC
  Administered 2018-05-16: 10:00:00 via INTRAVENOUS

## 2018-05-16 MED ORDER — DEXAMETHASONE SODIUM PHOSPHATE 10 MG/ML IJ SOLN
INTRAMUSCULAR | Status: AC
  Filled 2018-05-16: qty 1

## 2018-05-16 MED ORDER — SOD CITRATE-CITRIC ACID 500-334 MG/5ML PO SOLN
ORAL | Status: AC
  Filled 2018-05-16: qty 30

## 2018-05-16 MED ORDER — MISOPROSTOL 200 MCG PO TABS
ORAL_TABLET | ORAL | Status: AC
  Filled 2018-05-16: qty 5

## 2018-05-16 MED ORDER — IBUPROFEN 600 MG PO TABS: 600.0000 mg | ORAL_TABLET | Freq: Four times a day (QID) | ORAL | 3 refills | Status: DC | PRN

## 2018-05-16 SURGICAL SUPPLY — 26 items
ADAPTER VACURETTE TBG SET 14 (CANNULA) ×2 IMPLANT
CATH ROBINSON RED A/P 16FR (CATHETERS) ×3 IMPLANT
CNTNR SPEC C3OZ STD GRAD LEK (MISCELLANEOUS) ×1 IMPLANT
CONT SPEC 3OZ W/LID STRL (MISCELLANEOUS)
GLOVE BIO SURGEON STRL SZ7 (GLOVE) ×2 IMPLANT
GLOVE BIOGEL PI IND STRL 7.0 (GLOVE) ×1 IMPLANT
GLOVE BIOGEL PI IND STRL 7.5 (GLOVE) ×1 IMPLANT
GLOVE BIOGEL PI INDICATOR 7.0 (GLOVE) ×2
GLOVE BIOGEL PI INDICATOR 7.5 (GLOVE) ×2
GLOVE SURG SS PI 7.0 STRL IVOR (GLOVE) ×3 IMPLANT
GOWN STRL REUS W/TWL LRG LVL3 (GOWN DISPOSABLE) ×1 IMPLANT
GOWN STRL REUS W/TWL XL LVL3 (GOWN DISPOSABLE) ×5 IMPLANT
NS IRRIG 1000ML POUR BTL (IV SOLUTION) ×3 IMPLANT
PACK VAGINAL MINOR WOMEN LF (CUSTOM PROCEDURE TRAY) ×3 IMPLANT
PAD OB MATERNITY 4.3X12.25 (PERSONAL CARE ITEMS) ×3 IMPLANT
PAD PREP 24X48 CUFFED NSTRL (MISCELLANEOUS) ×3 IMPLANT
SET BERKELEY SUCTION TUBING (SUCTIONS) ×2 IMPLANT
TOWEL OR 17X24 6PK STRL BLUE (TOWEL DISPOSABLE) ×6 IMPLANT
TRAP TISSUE FILTER (MISCELLANEOUS) ×2 IMPLANT
TUBE VACURETTE 2ND TRIMESTER (CANNULA) IMPLANT
VACURETTE 10 RIGID CVD (CANNULA) IMPLANT
VACURETTE 12 RIGID CVD (CANNULA) ×2 IMPLANT
VACURETTE 7MM STRAIGHT (CANNULA) IMPLANT
VACURETTE 8 RIGID CVD (CANNULA) IMPLANT
VACURETTE 9 RIGID CVD (CANNULA) IMPLANT
VACURRETTE 7MM STRAIGHT (CANNULA)

## 2018-05-16 NOTE — Op Note (Signed)
Operative Note   05/16/2018  PRE-OP DIAGNOSIS: IUFD at 13wks based on CRL   POST-OP DIAGNOSIS: Same  SURGEON: Surgeon(s) and Role:    Hayward Bing, MD - Primary  ASSISTANT: None  PROCEDURE:  Suction dilation and curettage via ultrasound guidance  ANESTHESIA: General and paracervical block  ESTIMATED BLOOD LOSS:  DRAINS: per anesthesia note  TOTAL IV FLUIDS: per anesthesia note  SPECIMENS: products of conception to pathology  VTE PROPHYLAXIS: SCDs to the bilateral lower extremities  ANTIBIOTICS: Doxycycline 100mg  IV x 1 pre op  COMPLICATIONS: none  DISPOSITION: PACU - hemodynamically stable.  CONDITION: stable  BLOOD TYPE: A NEG. Rhogam given:yes  FINDINGS: Exam under anesthesia revealed 12 week sized uterus with no masses and bilateral adnexa without masses or fullness. Necrotic appearing products of conception were seen, with gritty texture in all four quadrants. Fetal parts counted and all parts accounted for and thin endometrial stripe without blood flow noted.   PROCEDURE IN DETAIL:  After informed consent was obtained, the patient was taken to the operating room where anesthesia was obtained without difficulty. The patient was positioned in the dorsal lithotomy position in [place] stirrups. The patient was examined under anesthesia, with the above noted findings.  The bi-valved speculum was placed inside the patient's vagina, and the the anterior lip of the cervix was seen and grasped with the tenaculum.  A paracervical block was achieved with 19mL of 1% lidocaine and then the cervix was easily dilated to a 31 French-Pratt dilator.  The suction was then calibrated to and connected to the number 12 cannula, which was then introduced with the above noted findings. After the bag of water was broken, sopher forceps were used to removed the products of conception. After it appeared that all fetal parts were removed, via ultrasound, they were counted and the  head, torso, both arms and legs were accounted for. More suction and the forceps were used to remove the placenta .  Then, a gentle curettage was done at the end and yield no products of conception.   The suction was then done one more time to remove any remaining curettage material.  A final ultrasound view showed a thin stripe and no abnormal arterial/venous flow.    Excellent hemostasis was noted, and all instruments were removed, with excellent hemostasis noted throughout.  Prophylactic bimanual massage and a dose of IM methergine was given. She was then taken out of dorsal lithotomy. The patient tolerated the procedure well.  Sponge, lap and instrument counts were correct x2.  The patient was taken to recovery room in excellent condition.  Cornelia Copa MD Attending [facility] [employer] [facility])

## 2018-05-16 NOTE — Anesthesia Postprocedure Evaluation (Signed)
Anesthesia Post Note  Patient: Gail Walker  Procedure(s) Performed: DILATATION AND EVACUATION (N/A Uterus) OPERATIVE ULTRASOUND (N/A Uterus)     Patient location during evaluation: PACU Anesthesia Type: General Level of consciousness: awake and alert Pain management: pain level controlled Vital Signs Assessment: post-procedure vital signs reviewed and stable Respiratory status: spontaneous breathing, nonlabored ventilation, respiratory function stable and patient connected to nasal cannula oxygen Cardiovascular status: blood pressure returned to baseline and stable Postop Assessment: no apparent nausea or vomiting Anesthetic complications: no    Last Vitals:  Vitals:   05/16/18 1045 05/16/18 1100  BP: 123/80 114/69  Pulse: 84 82  Resp: 15 18  Temp:  36.8 C  SpO2: 99% 100%    Last Pain:  Vitals:   05/16/18 1100  TempSrc:   PainSc: 2                  Kaycen Whitworth L Cordell Coke

## 2018-05-16 NOTE — Transfer of Care (Signed)
Immediate Anesthesia Transfer of Care Note  Patient: Gail Walker  Procedure(s) Performed: DILATATION AND EVACUATION (N/A Uterus) OPERATIVE ULTRASOUND (N/A Uterus)  Patient Location: PACU  Anesthesia Type:General  Level of Consciousness: awake, alert  and oriented  Airway & Oxygen Therapy: Patient Spontanous Breathing and Patient connected to face mask oxygen  Post-op Assessment: Report given to RN and Post -op Vital signs reviewed and stable  Post vital signs: Reviewed and stable  Last Vitals:  Vitals Value Taken Time  BP 137/72 05/16/2018  9:01 AM  Temp    Pulse 101 05/16/2018  9:03 AM  Resp 20 05/16/2018  9:04 AM  SpO2 100 % 05/16/2018  9:03 AM  Vitals shown include unvalidated device data.  Last Pain:  Vitals:   05/16/18 0744  TempSrc: Oral  PainSc: 0-No pain         Complications: No apparent anesthesia complications

## 2018-05-16 NOTE — Anesthesia Preprocedure Evaluation (Addendum)
Anesthesia Evaluation  Patient identified by MRN, date of birth, ID band Patient awake    Reviewed: Allergy & Precautions, NPO status , Patient's Chart, lab work & pertinent test results  Airway Mallampati: III  TM Distance: >3 FB Neck ROM: Full  Mouth opening: Limited Mouth Opening  Dental no notable dental hx. (+) Teeth Intact, Dental Advisory Given   Pulmonary asthma , former smoker,    Pulmonary exam normal breath sounds clear to auscultation       Cardiovascular negative cardio ROS Normal cardiovascular exam Rhythm:Regular Rate:Normal     Neuro/Psych PSYCHIATRIC DISORDERS Depression negative neurological ROS     GI/Hepatic Neg liver ROS, GERD  ,  Endo/Other  Morbid obesity  Renal/GU negative Renal ROS  negative genitourinary   Musculoskeletal negative musculoskeletal ROS (+)   Abdominal   Peds  Hematology negative hematology ROS (+)   Anesthesia Other Findings Finished a 5 day prednsione taper on 05/14/18 for asthma  Reproductive/Obstetrics                            Anesthesia Physical Anesthesia Plan  ASA: III  Anesthesia Plan: General   Post-op Pain Management:    Induction: Intravenous  PONV Risk Score and Plan: 3 and Ondansetron, Dexamethasone and Midazolam  Airway Management Planned: Oral ETT  Additional Equipment:   Intra-op Plan:   Post-operative Plan: Extubation in OR  Informed Consent: I have reviewed the patients History and Physical, chart, labs and discussed the procedure including the risks, benefits and alternatives for the proposed anesthesia with the patient or authorized representative who has indicated his/her understanding and acceptance.     Dental advisory given  Plan Discussed with: CRNA  Anesthesia Plan Comments:        Anesthesia Quick Evaluation

## 2018-05-16 NOTE — Anesthesia Procedure Notes (Signed)
Procedure Name: Intubation Date/Time: 05/16/2018 8:15 AM Performed by: Jonna Munro, CRNA Pre-anesthesia Checklist: Patient identified, Emergency Drugs available, Suction available, Patient being monitored and Timeout performed Patient Re-evaluated:Patient Re-evaluated prior to induction Oxygen Delivery Method: Circle system utilized Preoxygenation: Pre-oxygenation with 100% oxygen Induction Type: IV induction and Rapid sequence Ventilation: Mask ventilation without difficulty Laryngoscope Size: Mac and 3 Grade View: Grade I Tube type: Oral Tube size: 7.0 mm Number of attempts: 1 Airway Equipment and Method: Stylet Secured at: 21 cm Tube secured with: Tape Dental Injury: Teeth and Oropharynx as per pre-operative assessment

## 2018-05-16 NOTE — Discharge Instructions (Addendum)
We will discuss your surgery once again in detail at your post-op visit in two to four weeks. If you havent already done so, please call to make your appointment as soon as possible.  Dilation and Curettage or Vacuum Curettage, Care After These instructions give you information on caring for yourself after your procedure. Your doctor may also give you more specific instructions. Call your doctor if you have any problems or questions after your procedure. HOME CARE  Do not drive for 24 hours.  Wait 1 week before doing any activities that wear you out.  Do not stand for a long time.  Limit stair climbing to once or twice a day.  Rest often.  Continue with your usual diet.  Drink enough fluids to keep your pee (urine) clear or pale yellow.  If you have a hard time pooping (constipation), you may:  Take a medicine to help you go poop (laxative) as told by your doctor.  Eat more fruit and bran.  Drink more fluids.  Take showers, not baths, for as long as told by your doctor.  Do not swim or use a hot tub until your doctor says it is okay.  Have someone with you for 1day after the procedure.  Do not douche, use tampons, or have sex (intercourse) until seen by your doctor  Only take medicines as told by your doctor. Do not take aspirin. It can cause bleeding.  Keep all doctor visits. GET HELP IF:  You have cramps or pain not helped by medicine.  You have new pain in the belly (abdomen).  You have a bad smelling fluid coming from your vagina.  You have a rash.  You have problems with any medicine. GET HELP RIGHT AWAY IF:   You start to bleed more than a regular period.  You have a fever.  You have chest pain.  You have trouble breathing.  You feel dizzy or feel like passing out (fainting).  You pass out.  You have pain in the tops of your shoulders.  You have vaginal bleeding with or without clumps of blood (blood clots). MAKE SURE YOU:  Understand  these instructions.  Will watch your condition.  Will get help right away if you are not doing well or get worse. Document Released: 01/12/2008 Document Revised: 04/09/2013 Document Reviewed: 11/01/2012 [facility] Patient Information 2015 [place], [place]. This information is not intended to replace advice given to you by your health care provider. Make sure you discuss any questions you have with your health care provider.    Post Anesthesia Home Care Instructions  NO IBUPROFEN UNTIL AFTER 03:00PM ON 05/16/2018.  Activity: Get plenty of rest for the remainder of the day. A responsible individual must stay with you for 24 hours following the procedure.  For the next 24 hours, DO NOT: -Drive a car -Advertising copywriter -Drink alcoholic beverages -Take any medication unless instructed by your physician -Make any legal decisions or sign important papers.  Meals: Start with liquid foods such as gelatin or soup. Progress to regular foods as tolerated. Avoid greasy, spicy, heavy foods. If nausea and/or vomiting occur, drink only clear liquids until the nausea and/or vomiting subsides. Call your physician if vomiting continues.  Special Instructions/Symptoms: Your throat may feel dry or sore from the anesthesia or the breathing tube placed in your throat during surgery. If this causes discomfort, gargle with warm salt water. The discomfort should disappear within 24 hours.  If you had a scopolamine patch placed behind  your ear for the management of post- operative nausea and/or vomiting:  1. The medication in the patch is effective for 72 hours, after which it should be removed.  Wrap patch in a tissue and discard in the trash. Wash hands thoroughly with soap and water. 2. You may remove the patch earlier than 72 hours if you experience unpleasant side effects which may include dry mouth, dizziness or visual disturbances. 3. Avoid touching the patch. Wash your hands with soap and water after  contact with the patch.

## 2018-05-16 NOTE — Addendum Note (Signed)
Addendum  created 05/16/18 1201 by Shanon Payor, CRNA   Intraprocedure Flowsheets edited

## 2018-05-16 NOTE — H&P (Signed)
Obstetrics & Gynecology Pre-Op H&P   Date of Surgery: 05/16/2018   [facility] Primary Care Provider: Patient, No Pcp Per  Reason for Admission: scheduled surgery  History of Present Illness: [name] is a 28 y.o. [id number] (No LMP recorded (lmp unknown). Patient is pregnant.), with the above CC. PMHx is significant for Rh negative, BMI 40s, asthma  Patient went to [facility] on 1/27 with pain and h/o spotting and diagnosed with IUFD at 13wks based on CRL. She had an earlier u/s in November that put her at 17wks at the time of diagnosis. NOB visit was scheduled for 1/28.   She was set up for surgical management today.  She denies any bleeding, pain or fevers since her [facility] visit   ROS: A 12-point review of systems was performed and negative, except as stated in the above HPI.  OBGYN History: As per HPI. OB History  Gravida Para Term Preterm AB Living  3 1 1  0 1 1  SAB TAB Ectopic Multiple Live Births  0 1 0 0 1    # Outcome Date GA Lbr Len/2nd Weight Sex Delivery Anes PTL Lv  3 Current           2 Term 07/11/11 1143w0d 19:42 / 02:26 3500 g M Vag-Spont EPI  LIV  1 TAB 07/2010            Past Medical History: Past Medical History:  Diagnosis Date  . Acne   . Asthma    uses albuterol inhaler daily  . CAP (community acquired pneumonia) 11/14/2015  . Chlamydia 2012  . GERD (gastroesophageal reflux disease)    during pregnancy - takes zantac    Past Surgical History: Past Surgical History:  Procedure Laterality Date  . pregnancy termination      Family History:  Family History  Problem Relation Age of Onset  . Diabetes Father   . Cancer Maternal Aunt   . Anesthesia problems Neg Hx   . Hearing loss Neg Hx   . Stroke Neg Hx   . CAD Neg Hx     Social History:  Social History   Socioeconomic History  . Marital status: Single    Spouse name: Not on file  . Number of children: Not on file  . Years of education: Not on file  . Highest  education level: Not on file  Occupational History  . 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 Financial resource strain: Not on file  . Food insecurity:    Worry: Not on file    Inability: Not on file  . Transportation needs:    Medical: Not on file    Non-medical: Not on file  Tobacco Use  . Smoking status: Former Smoker    Packs/day: 0.25    Years: 3.00    Pack years: 0.75    Types: Cigarettes    Last attempt to quit: 02/07/2018    Years since quitting: 0.2  . Smokeless tobacco: Never Used  Substance and Sexual Activity  . Alcohol use: No  . Drug use: No  . Sexual activity: Yes    Birth control/protection: None  Lifestyle  . Physical activity:    Days per week: Not on file    Minutes per session: Not on file  . Stress: Not on file  Relationships  . Social connections:    Talks on phone: Not on file    Gets together: Not on file  Attends religious service: Not on file    Active member of club or organization: Not on file    Attends meetings of clubs or organizations: Not on file    Relationship status: Not on file  . Intimate partner violence:    Fear of current or ex partner: Not on file    Emotionally abused: Not on file    Physically abused: Not on file    Forced sexual activity: Not on file  Other Topics Concern  . Not on file  Social History Narrative  . Not on file     Allergy: No Known Allergies  Current Outpatient Medications: Medications Prior to Admission  Medication Sig Dispense Refill Last Dose  . albuterol (PROVENTIL) (2.5 MG/3ML) 0.083% nebulizer solution Take 3 mLs (2.5 mg total) by nebulization every 6 (six) hours as needed for wheezing or shortness of breath. 75 mL 3 05/16/2018 at Unknown time     Hospital Medications: Current Facility-Administered Medications  Medication Dose Route Frequency Provider Last Rate Last Dose  . doxycycline (VIBRAMYCIN) 100 mg in sodium chloride 0.9 % 250 mL IVPB  100 mg Intravenous Once  [place] Bing, MD      . fentaNYL (SUBLIMAZE) injection 50-100 mcg  50-100 mcg Intravenous PRN Lucretia Kern, MD      . lactated ringers infusion   Intravenous Continuous Lucretia Kern, MD      . lactated ringers infusion   Intravenous Continuous [place] Bing, MD      . midazolam (VERSED) injection 1-2 mg  1-2 mg Intravenous PRN Lucretia Kern, MD      . scopolamine (TRANSDERM-SCOP) 1 MG/3DAYS 1.5 mg  1 patch Transdermal Once PRN Lucretia Kern, MD         Physical Exam: Vitals:   05/15/18 1357  Weight: 113.9 kg  Height: 5\' 3"  (1.6 m)    Weight:  [113.9 kg] 113.9 kg (01/28 1357) No intake/output data recorded. No intake/output data recorded. No intake or output data in the 24 hours ending 05/16/18 0745   Current Vital Signs 24h Vital Sign Ranges  T   No data recorded  BP   No data recorded  HR   No data recorded  RR   No data recorded  SaO2     No data recorded       24 Hour I/O Current Shift I/O  Time Ins Outs No intake/output data recorded. No intake/output data recorded.   Patient Vitals for the past 24 hrs:  Height Weight  05/15/18 1357 5\' 3"  (1.6 m) 113.9 kg    Body mass index is 44.46 kg/m. General appearance: Well nourished, well developed female in no acute distress.  Neck:  Supple, normal appearance, and no thyromegaly  Cardiovascular: S1, S2 normal, no murmur, rub or gallop, regular rate and rhythm Respiratory:  Clear to auscultation bilateral. Normal respiratory effort Abdomen: positive bowel sounds and no masses, hernias; diffusely non tender to palpation, non distended Neuro/Psych:  Normal mood and affect.  Skin:  Warm and dry.  Extremities: no clubbing, cyanosis, or edema.    Laboratory:  Pending: CBC, t&s  CBC Latest Ref Rng & Units 03/30/2018 02/07/2018 01/12/2018  WBC 4.0 - 10.5 K/uL 6.5 6.2 7.0  Hemoglobin 12.0 - 15.0 g/dL 16.1 09.6 04.5  Hematocrit 36.0 - 46.0 % 38.7 38.4 35.9(L)  Platelets 150 - 400 K/uL 299 273 293     Imaging:  OBSTETRICS REPORT                       (  Signed Final 05/14/2018 10:37 pm) ---------------------------------------------------------------------- Patient Info  ID #:       [id number]                          D.O.B.:  12-06-1990 (27 yrs)  Name:       Pollie Meyer               Visit Date: 05/14/2018 04:51 pm ---------------------------------------------------------------------- Performed By  Performed By:     Marcellina Millin          Ref. Address:      [address]                    [address]                                                              Road  Attending:        Lin Landsman      Location:          [facility]                    MD  Referred By:      Judeth Horn                    CNM ---------------------------------------------------------------------- Orders   #  Description                          Code         Ordered By   1  [place] MFM OB LIMITED                    40981.19     Judeth Horn  ----------------------------------------------------------------------   #  Order #                    Accession #                 Episode #   1  [id number]                  [id number]                  [id number]  ---------------------------------------------------------------------- Indications   Unable to hear fetal heart tones as reason     O76   for ultrasound   [redacted] weeks gestation of pregnancy                Z3A.17   Vaginal bleeding in pregnancy, second          O46.92   trimester   Pelvic pain affecting pregnancy in third       O26.893   trimester  ---------------------------------------------------------------------- Fetal Evaluation  Num Of Fetuses:          1  Cardiac Activity:        Not visualized  Presentation:            Transverse, head to maternal right  Placenta:                Posterior  Amniotic Fluid  AFI FV:      Within normal limits ---------------------------------------------------------------------- OB  History  Gravidity:     3         Term:   1        Prem:   0        SAB:   0  TOP:          1       Ectopic:  0        Living: 1 ---------------------------------------------------------------------- Gestational Age  Best:          17w 4d     Det. ByMarcella Dubs:  Early Ultrasound         EDD:   10/18/18                                      (03/09/18) ---------------------------------------------------------------------- Cervix Uterus Adnexa  Cervix  Length:            4.3  cm.  Normal appearance by transabdominal scan. ---------------------------------------------------------------------- Impression  IUFD no cardiac activity  Fetal cranium scalloping noted ---------------------------------------------------------------------- Recommendations  Follow up as clinically indicated. ----------------------------------------------------------------------               Lin Landsmanorenthian Booker, MD Electronically Signed Final Report   05/14/2018 10:37 pm  Assessment: [name] is a 28 y.o. [id number] with IUFD at 13wks. Patient currently stable  Plan: D/w pt and she is amenable to proceeding with surgical management. Can proceed when CBC and t&s are back. Will use u/s guidance given gestational age. Rhogam in PACU  Cornelia Copaharlie Cahterine Heinzel, Jr. MD Attending [facility](Faculty Practice)

## 2018-05-17 ENCOUNTER — Encounter (HOSPITAL_BASED_OUTPATIENT_CLINIC_OR_DEPARTMENT_OTHER): Payer: Self-pay | Admitting: Obstetrics and Gynecology

## 2018-05-17 LAB — RH IG WORKUP (INCLUDES ABO/RH)
ABO/RH(D): A NEG
Gestational Age(Wks): 13
Unit division: 0

## 2018-05-21 ENCOUNTER — Other Ambulatory Visit: Payer: Self-pay

## 2018-05-22 ENCOUNTER — Encounter: Payer: Self-pay | Admitting: Family Medicine

## 2018-05-26 ENCOUNTER — Emergency Department (HOSPITAL_COMMUNITY): Payer: Medicaid Other

## 2018-05-26 ENCOUNTER — Encounter (HOSPITAL_COMMUNITY): Payer: Self-pay | Admitting: Emergency Medicine

## 2018-05-26 ENCOUNTER — Emergency Department (HOSPITAL_COMMUNITY)
Admission: EM | Admit: 2018-05-26 | Discharge: 2018-05-26 | Disposition: A | Discharge status: Home / Self Care | Payer: Medicaid Other | Attending: Emergency Medicine | Admitting: Emergency Medicine

## 2018-05-26 ENCOUNTER — Other Ambulatory Visit: Payer: Self-pay

## 2018-05-26 DIAGNOSIS — S8391XA Sprain of unspecified site of right knee, initial encounter: Secondary | ICD-10-CM | POA: Insufficient documentation

## 2018-05-26 DIAGNOSIS — Z87891 Personal history of nicotine dependence: Secondary | ICD-10-CM | POA: Diagnosis not present

## 2018-05-26 DIAGNOSIS — Y9389 Activity, other specified: Secondary | ICD-10-CM | POA: Diagnosis not present

## 2018-05-26 DIAGNOSIS — F329 Major depressive disorder, single episode, unspecified: Secondary | ICD-10-CM | POA: Insufficient documentation

## 2018-05-26 DIAGNOSIS — Y998 Other external cause status: Secondary | ICD-10-CM | POA: Diagnosis not present

## 2018-05-26 DIAGNOSIS — J45909 Unspecified asthma, uncomplicated: Secondary | ICD-10-CM | POA: Diagnosis not present

## 2018-05-26 DIAGNOSIS — Y9289 Other specified places as the place of occurrence of the external cause: Secondary | ICD-10-CM | POA: Insufficient documentation

## 2018-05-26 DIAGNOSIS — S8991XA Unspecified injury of right lower leg, initial encounter: Secondary | ICD-10-CM | POA: Diagnosis present

## 2018-05-26 DIAGNOSIS — W01198A Fall on same level from slipping, tripping and stumbling with subsequent striking against other object, initial encounter: Secondary | ICD-10-CM | POA: Insufficient documentation

## 2018-05-26 MED ORDER — IBUPROFEN 800 MG PO TABS: 800.0000 mg | ORAL_TABLET | Freq: Three times a day (TID) | ORAL | 0 refills | Status: DC

## 2018-05-26 MED ORDER — IBUPROFEN 800 MG PO TABS
800.0000 mg | ORAL_TABLET | Freq: Once | ORAL | Status: AC
  Administered 2018-05-26: 800 mg via ORAL
  Filled 2018-05-26: qty 1

## 2018-05-26 NOTE — Discharge Instructions (Signed)
1.  Take ibuprofen 800 mg 3 times a day with some food on your stomach for pain and swelling 2.  Keep your leg elevated as much as possible for the next 2 to 4 days.  Apply a well wrapped ice pack for 20 minutes about every 2 hours. 3.  Wear knee immobilizer when you are up and walking or moving about. 4.  Schedule a follow-up appointment with the orthopedic doctor listed above.  You may also choose to follow-up first with a family doctor and subsequent referral and evaluation if you are having ongoing problems.

## 2018-05-26 NOTE — ED Notes (Signed)
Bed: [facility] Expected date:  Expected time:  Means of arrival:  Comments: 
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
## 2018-05-26 NOTE — ED Notes (Signed)
Crutches given to the patient and knee immobilizer applied to patient right knee.

## 2018-06-02 ENCOUNTER — Emergency Department (HOSPITAL_BASED_OUTPATIENT_CLINIC_OR_DEPARTMENT_OTHER)
Admission: EM | Admit: 2018-06-02 | Discharge: 2018-06-02 | Disposition: A | Discharge status: Home / Self Care | Payer: Medicaid Other | Attending: Emergency Medicine | Admitting: Emergency Medicine

## 2018-06-02 ENCOUNTER — Encounter (HOSPITAL_BASED_OUTPATIENT_CLINIC_OR_DEPARTMENT_OTHER): Payer: Self-pay | Admitting: Adult Health

## 2018-06-02 ENCOUNTER — Other Ambulatory Visit: Payer: Self-pay

## 2018-06-02 DIAGNOSIS — L259 Unspecified contact dermatitis, unspecified cause: Secondary | ICD-10-CM | POA: Diagnosis not present

## 2018-06-02 DIAGNOSIS — F329 Major depressive disorder, single episode, unspecified: Secondary | ICD-10-CM | POA: Insufficient documentation

## 2018-06-02 DIAGNOSIS — R062 Wheezing: Secondary | ICD-10-CM | POA: Diagnosis not present

## 2018-06-02 DIAGNOSIS — L309 Dermatitis, unspecified: Secondary | ICD-10-CM

## 2018-06-02 DIAGNOSIS — R509 Fever, unspecified: Secondary | ICD-10-CM | POA: Diagnosis present

## 2018-06-02 DIAGNOSIS — R69 Illness, unspecified: Secondary | ICD-10-CM

## 2018-06-02 DIAGNOSIS — J111 Influenza due to unidentified influenza virus with other respiratory manifestations: Secondary | ICD-10-CM | POA: Insufficient documentation

## 2018-06-02 DIAGNOSIS — Z87891 Personal history of nicotine dependence: Secondary | ICD-10-CM | POA: Diagnosis not present

## 2018-06-02 MED ORDER — IPRATROPIUM-ALBUTEROL 0.5-2.5 (3) MG/3ML IN SOLN
3.0000 mL | Freq: Once | RESPIRATORY_TRACT | Status: AC
  Administered 2018-06-02: 3 mL via RESPIRATORY_TRACT
  Filled 2018-06-02: qty 3

## 2018-06-02 MED ORDER — TRIAMCINOLONE ACETONIDE 0.1 % EX CREA: 1.0000 "application " | TOPICAL_CREAM | Freq: Two times a day (BID) | CUTANEOUS | 1 refills | Status: DC

## 2018-06-02 MED ORDER — ALBUTEROL SULFATE (5 MG/ML) 0.5% IN NEBU: 2.5000 mg | INHALATION_SOLUTION | Freq: Four times a day (QID) | RESPIRATORY_TRACT | 12 refills | Status: DC | PRN

## 2018-06-02 MED ORDER — ALBUTEROL (5 MG/ML) CONTINUOUS INHALATION SOLN
10.0000 mg/h | INHALATION_SOLUTION | RESPIRATORY_TRACT | Status: AC
  Administered 2018-06-02: 10 mg/h via RESPIRATORY_TRACT
  Filled 2018-06-02: qty 20

## 2018-06-02 MED ORDER — OSELTAMIVIR PHOSPHATE 75 MG PO CAPS: 75.0000 mg | ORAL_CAPSULE | Freq: Two times a day (BID) | ORAL | 0 refills | Status: DC

## 2018-06-02 MED ORDER — PREDNISONE 20 MG PO TABS: 40.0000 mg | ORAL_TABLET | Freq: Every day | ORAL | 0 refills | Status: DC

## 2018-06-02 MED ORDER — ALBUTEROL SULFATE (2.5 MG/3ML) 0.083% IN NEBU: 5.0000 mg | INHALATION_SOLUTION | RESPIRATORY_TRACT | 1 refills | Status: DC | PRN

## 2018-06-02 MED ORDER — ALBUTEROL SULFATE HFA 108 (90 BASE) MCG/ACT IN AERS
2.0000 | INHALATION_SPRAY | RESPIRATORY_TRACT | Status: DC | PRN
  Administered 2018-06-02: 2 via RESPIRATORY_TRACT
  Filled 2018-06-02: qty 6.7

## 2018-06-02 MED ORDER — PREDNISONE 20 MG PO TABS
40.0000 mg | ORAL_TABLET | Freq: Once | ORAL | Status: AC
  Administered 2018-06-02: 40 mg via ORAL
  Filled 2018-06-02: qty 2

## 2018-06-02 MED ORDER — ALBUTEROL SULFATE (2.5 MG/3ML) 0.083% IN NEBU
2.5000 mg | INHALATION_SOLUTION | Freq: Once | RESPIRATORY_TRACT | Status: AC
  Administered 2018-06-02: 2.5 mg via RESPIRATORY_TRACT
  Filled 2018-06-02: qty 3

## 2018-06-02 NOTE — Discharge Instructions (Signed)
Please read and follow all provided instructions.  Your diagnoses today include:  1. Wheezing   2. Influenza-like illness   3. Eczema, unspecified type     Tests performed today include:  Vital signs. See below for your results today.   Medications prescribed:   Albuterol inhaler - medication that opens up your airway  Use inhaler as follows: 1-2 puffs with spacer every 4 hours as needed for wheezing, cough, or shortness of breath.    Prednisone - steroid medicine   It is best to take this medication in the morning to prevent sleeping problems. If you are diabetic, monitor your blood sugar closely and stop taking Prednisone if blood sugar is over 300. Take with food to prevent stomach upset.    Triamcinolone (Kenalog) cream - topical steroid medication for skin reaction  Take any prescribed medications only as directed.  Home care instructions:  Follow any educational materials contained in this packet. Please continue drinking plenty of fluids. Use over-the-counter cold and flu medications as needed as directed on packaging for symptom relief. You may also use ibuprofen or tylenol as directed on packaging for pain or fever.   BE VERY CAREFUL not to take multiple medicines containing Tylenol (also called acetaminophen). Doing so can lead to an overdose which can damage your liver and cause liver failure and possibly death.   Follow-up instructions: Please follow-up with your primary care provider in the next 3 days for further evaluation of your symptoms.   Return instructions:   Please return to the Emergency Department if you experience worsening symptoms.  Please return if you have a high fever greater than 101 degrees not controlled with over-the-counter medications, persistent vomiting and cannot keep down fluids, or worsening trouble breathing.  Please return if you have any other emergent concerns.  Additional Information:  Your vital signs today were: BP (!)  130/94    Pulse 98    Temp 98.2 F (36.8 C) (Oral)    Resp (!) 22    LMP  (LMP Unknown)    SpO2 98%  If your blood pressure (BP) was elevated above 135/85 this visit, please have this repeated by your doctor within one month.

## 2018-06-02 NOTE — ED Provider Notes (Signed)
[facility] EMERGENCY DEPARTMENT Provider Note   CSN: [id number] Arrival date & time: 06/02/18  1159     History   Chief Complaint Chief Complaint  Patient presents with  . Cough    HPI Gail Walker is a 28 y.o. female.  Patient presents with 48 hrs of subjective fever, chills, sore throat, cough, body aches.  Patient has a history of asthma and has been having increased wheezing and shortness of breath recently.  She has been taking over-the-counter medications.  She is currently out of albuterol for her nebulizer machine and inhaler.  No chest pain, nausea, vomiting, or diarrhea.  No abdominal pain or urinary symptoms.  Patient was around her son who became sick 2 days before she did.  Onset of symptoms acute.  Course is constant.     Past Medical History:  Diagnosis Date  . Acne   . Asthma    uses albuterol inhaler daily  . CAP (community acquired pneumonia) 11/14/2015  . Chlamydia 2012  . GERD (gastroesophageal reflux disease)    during pregnancy - takes zantac    Patient Active Problem List   Diagnosis Date Noted  . IUFD at less than 20 weeks of gestation 05/15/2018  . BMI 40.0-44.9, adult (HCC) 05/15/2018  . Morning sickness 03/12/2018  . Depression 11/14/2011  . Acne 04/26/2011  . Rh negative state in antepartum period 02/23/2011  . TOBACCO USER 12/27/2008  . OBESITY, NOS 06/15/2006  . RHINITIS, ALLERGIC 06/15/2006  . Asthma 06/15/2006  . ECZEMA, ATOPIC DERMATITIS 06/15/2006    Past Surgical History:  Procedure Laterality Date  . DILATION AND EVACUATION N/A 05/16/2018   Procedure: DILATATION AND EVACUATION;  Surgeon: [place] Bing, MD;  Location: [facility];  Service: Gynecology;  Laterality: N/A;  . OPERATIVE ULTRASOUND N/A 05/16/2018   Procedure: OPERATIVE ULTRASOUND;  Surgeon: [place] Bing, MD;  Location: [facility];  Service: Gynecology;  Laterality: N/A;  . pregnancy termination       OB History    Gravida  3   Para  1   Term  1   Preterm  0   AB  1   Living  1     SAB  0   TAB  1   Ectopic  0   Multiple  0   Live Births  1            Home Medications    Prior to Admission medications   Medication Sig Start Date End Date Taking? Authorizing Provider  albuterol (PROVENTIL) (2.5 MG/3ML) 0.083% nebulizer solution Take 3 mLs (2.5 mg total) by nebulization every 6 (six) hours as needed for wheezing or shortness of breath. 01/13/18   Elson Areas, PA-C  ibuprofen (ADVIL,MOTRIN) 600 MG tablet Take 1 tablet (600 mg total) by mouth every 6 (six) hours as needed. 05/16/18   [place] Bing, MD  ibuprofen (ADVIL,MOTRIN) 800 MG tablet Take 1 tablet (800 mg total) by mouth 3 (three) times daily. 05/26/18   Arby Barrette, MD  oxyCODONE-acetaminophen (PERCOCET/ROXICET) 5-325 MG tablet Take 1-2 tablets by mouth every 6 (six) hours as needed. 05/16/18   [place] Bing, MD    Family History Family History  Problem Relation Age of Onset  . Diabetes Father   . Cancer Maternal Aunt   . Anesthesia problems Neg Hx   . Hearing loss Neg Hx   . Stroke Neg Hx   . CAD Neg Hx     Social History Social History  Tobacco Use  . Smoking status: Former Smoker    Packs/day: 0.25    Years: 3.00    Pack years: 0.75    Types: Cigarettes    Last attempt to quit: 02/07/2018    Years since quitting: 0.3  . Smokeless tobacco: Never Used  Substance Use Topics  . Alcohol use: No  . Drug use: No     Allergies   Patient has no known allergies.   Review of Systems Review of Systems  Constitutional: Positive for chills, fatigue and fever.  HENT: Positive for congestion and sore throat. Negative for ear pain, rhinorrhea and sinus pressure.   Eyes: Negative for redness.  Respiratory: Positive for cough, shortness of breath and wheezing.   Cardiovascular: Negative for chest pain.  Gastrointestinal: Negative for abdominal pain, diarrhea, nausea and vomiting.  Genitourinary:  Negative for dysuria.  Musculoskeletal: Positive for myalgias. Negative for neck stiffness.  Skin: Negative for rash.  Neurological: Negative for headaches.  Hematological: Negative for adenopathy.     Physical Exam Updated Vital Signs BP (!) 130/94   Pulse 98   Temp 98.2 F (36.8 C) (Oral)   Resp (!) 22   LMP  (LMP Unknown)   SpO2 97%   Physical Exam Vitals signs and nursing note reviewed.  Constitutional:      Appearance: She is well-developed.  HENT:     Head: Normocephalic and atraumatic.     Jaw: No trismus.     Right Ear: Tympanic membrane, ear canal and external ear normal.     Left Ear: Tympanic membrane, ear canal and external ear normal.     Nose: Nose normal. No mucosal edema or rhinorrhea.     Mouth/Throat:     Mouth: Mucous membranes are not dry. No oral lesions.     Pharynx: Uvula midline. No oropharyngeal exudate, posterior oropharyngeal erythema or uvula swelling.     Tonsils: No tonsillar abscesses.  Eyes:     General:        Right eye: No discharge.        Left eye: No discharge.     Conjunctiva/sclera: Conjunctivae normal.  Neck:     Musculoskeletal: Normal range of motion and neck supple.  Cardiovascular:     Rate and Rhythm: Normal rate and regular rhythm.     Heart sounds: Normal heart sounds.  Pulmonary:     Effort: Pulmonary effort is normal. No respiratory distress.     Breath sounds: Wheezing present. No rales.     Comments: Scattered, moderate, expiratory Abdominal:     Palpations: Abdomen is soft.     Tenderness: There is no abdominal tenderness.  Lymphadenopathy:     Cervical: No cervical adenopathy.  Skin:    General: Skin is warm and dry.  Neurological:     Mental Status: She is alert.      ED Treatments / Results  Labs (all labs ordered are listed, but only abnormal results are displayed) Labs Reviewed - No data to display  EKG None  Radiology No results found.  Procedures Procedures (including critical care  time)  Medications Ordered in ED Medications  albuterol (PROVENTIL,VENTOLIN) solution continuous neb (10 mg/hr Nebulization New Bag/Given 06/02/18 1242)  albuterol (PROVENTIL HFA;VENTOLIN HFA) 108 (90 Base) MCG/ACT inhaler 2 puff (has no administration in time range)  ipratropium-albuterol (DUONEB) 0.5-2.5 (3) MG/3ML nebulizer solution 3 mL (3 mLs Nebulization Given 06/02/18 1220)  albuterol (PROVENTIL) (2.5 MG/3ML) 0.083% nebulizer solution 2.5 mg (2.5 mg Nebulization Given 06/02/18 1220)  predniSONE (DELTASONE) tablet 40 mg (40 mg Oral Given 06/02/18 1256)     Initial Impression / Assessment and Plan / ED Course  I have reviewed the triage vital signs and the nursing notes.  Pertinent labs & imaging results that were available during my care of the patient were reviewed by me and considered in my medical decision making (see chart for details).     Patient seen and examined.  No respiratory distress but with significant bronchospasm on exam.  Albuterol/Atrovent ordered.   Vital signs reviewed and are as follows: BP (!) 130/94   Pulse 98   Temp 98.2 F (36.8 C) (Oral)   Resp (!) 22   LMP  (LMP Unknown)   SpO2 97%   Per RT, patient is not much better after first breathing treatment.  Will place on 10 mg continuous nebulizer treatment.  Prednisone ordered.  Suspect underlying influenza.  Patient will be treated with Tamiflu.  2:05 PM patient reevaluated.  She feels better and has decreased wheezing.  Much better air movement now.  She is comfortable with going home.  She is jittery and tachycardic from the albuterol that was just given.  Prescriptions written for albuterol, prednisone, Tamiflu.  She requests refill of triamcinolone ointment for eczema which is causing her itching.  Encourage patient to return to the emergency department with worsening shortness of breath, trouble breathing, high fever, persistent vomiting, new symptoms or other concerns.  She verbalizes understanding  agrees with the plan.  Final Clinical Impressions(s) / ED Diagnoses   Final diagnoses:  Wheezing  Influenza-like illness  Eczema, unspecified type   Patient with asthma exacerbation in setting of viral flulike illness.  Patient is afebrile here.  She was treated in the emergency department with albuterol and Atrovent.  Also given dose of oral prednisone.  She will be maintained on these medications.  Her symptoms in the ED are improved to such a point where she feels comfortable with going home.  She is agreeable to returning if her symptoms worsen.  She will have home medications, both albuterol inhaler and nebulizer to use.  ED Discharge Orders         Ordered    albuterol (PROVENTIL) (5 MG/ML) 0.5% nebulizer solution  Every 6 hours PRN     06/02/18 1401    albuterol (PROVENTIL) (2.5 MG/3ML) 0.083% nebulizer solution  Every 4 hours PRN     06/02/18 1401    triamcinolone cream (KENALOG) 0.1 %  2 times daily     06/02/18 1401    predniSONE (DELTASONE) 20 MG tablet  Daily,   Status:  Discontinued     06/02/18 1401    oseltamivir (TAMIFLU) 75 MG capsule  Every 12 hours     06/02/18 1404    predniSONE (DELTASONE) 20 MG tablet  Daily     06/02/18 1405           Renne CriglerGeiple, Doyne Ellinger, PA-C 06/02/18 1407    Maia PlanLong, Carmina Walle G, MD 06/02/18 2009

## 2018-06-02 NOTE — ED Triage Notes (Signed)
Presents with fever, sore throat, cough and body aches, pt has asthma and is wheezing.

## 2018-06-13 ENCOUNTER — Ambulatory Visit: Payer: Medicaid Other | Admitting: Obstetrics and Gynecology

## 2018-07-10 ENCOUNTER — Ambulatory Visit (INDEPENDENT_AMBULATORY_CARE_PROVIDER_SITE_OTHER): Payer: Medicaid Other | Admitting: Family Medicine

## 2018-07-10 ENCOUNTER — Encounter: Payer: Self-pay | Admitting: Family Medicine

## 2018-07-10 ENCOUNTER — Other Ambulatory Visit: Payer: Self-pay

## 2018-07-10 VITALS — BP 112/90 | HR 94 | Temp 99.0°F | Wt 242.5 lb

## 2018-07-10 DIAGNOSIS — J4540 Moderate persistent asthma, uncomplicated: Secondary | ICD-10-CM

## 2018-07-10 DIAGNOSIS — R21 Rash and other nonspecific skin eruption: Secondary | ICD-10-CM | POA: Diagnosis not present

## 2018-07-10 MED ORDER — FLUTICASONE PROPIONATE HFA 110 MCG/ACT IN AERO: 1.0000 | INHALATION_SPRAY | Freq: Two times a day (BID) | RESPIRATORY_TRACT | 12 refills | Status: DC

## 2018-07-10 MED ORDER — TRIAMCINOLONE ACETONIDE 0.1 % EX CREA: 1.0000 "application " | TOPICAL_CREAM | Freq: Two times a day (BID) | CUTANEOUS | 1 refills | Status: DC

## 2018-07-10 NOTE — Patient Instructions (Signed)
It was great to see you!  Our plans for today:  -We are prescribing Flovent for your asthma.  Take 1 puff twice daily.  Continue to take your albuterol as needed. -You can take triamcinolone as needed for your rash.  You may also be helpful to take Zyrtec every day instead of as needed.  Please make an appointment to have a biopsy of your rash.  Take care and seek immediate care sooner if you develop any concerns.   Dr. [facility]

## 2018-07-10 NOTE — Assessment & Plan Note (Signed)
Has required at least 4 ED visits within the past 6 months and current daily albuterol use.  Is on no controller medications as of now.  Comfortable appearing on room air with normal work of breathing and without wheezing today, however given she has required increased use of rescue inhaler, will prescribe controller with Flovent.

## 2018-07-10 NOTE — Progress Notes (Signed)
  Subjective:   Patient ID: Gail Walker    DOB: 1990-05-03, 28 y.o. female   MRN: [id number]  Gail Walker is a 28 y.o. female with a history of allergic rhinitis, eczema, obesity, tobacco use, depression here for   Eczema - previously tried triamcinolone cream, last refilled 05/2018. Helps some, not completely - also tried hydrocortisone OTC and cocoa butter helps somewhat. - has been told about injections for eczema and is curious about these -Rash is described as itchy dark spots over bilateral thighs, arms, upper back, and buttocks.  She states it occurred after having chickenpox many years ago.   - worse in summertime usually - doesn't seem to be related to asthma. - takes zyrtec prn  Asthma - uses only albuterol now. Currently using albuterol nebulizer 3x per day. - previously admitted for pneumonia but has been years ago - 4 ED visits in the last 6 months. - previously used flovent, advair many years ago.  - never had to be intubated - no fevers, sick contacts -No current difficulty breathing  Review of Systems:  Per HPI.  PMFSH, medications and smoking status reviewed.  Objective:   BP 112/90   Pulse 94   Temp 99 F (37.2 C) (Oral)   Wt 242 lb 8 oz (110 kg)   LMP 07/09/2018   SpO2 97%   BMI 42.96 kg/m  Vitals and nursing note reviewed.  General: Obese female, in no acute distress with non-toxic appearance HEENT: normocephalic, atraumatic, moist mucous membranes CV: regular rate and rhythm without murmurs, rubs, or gallops Lungs: clear to auscultation bilaterally with normal work of breathing on room air, appropriately saturated.  No wheezing, rales. Skin: warm, dry.  Scattered hyperpigmented nodules with excoriations located on back of upper arms and upper back, no surrounding erythema.  No pustules.  See picture below. Extremities: warm and well perfused, normal tone MSK: ROM grossly intact, strength intact, gait normal Neuro: Alert and oriented, speech  normal   L arm  Assessment & Plan:   Rash and nonspecific skin eruption Rash not completely consistent with eczema although could be a nummular variant.  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  Does not appear to be acutely infected, no pustules, surrounding erythema.  No dry scaly patches.  Will refill triamcinolone cream, also recommended daily use of Zyrtec rather than as needed.  Will need biopsy of lesions to determine true etiology, patient agreeable.  Asthma Has required at least 4 ED visits within the past 6 months and current daily albuterol use.  Is on no controller medications as of now.  Comfortable appearing on room air with normal work of breathing and without wheezing today, however given she has required increased use of rescue inhaler, will prescribe controller with Flovent.  No orders of the defined types were placed in this encounter.  Meds ordered this encounter  Medications  . fluticasone (FLOVENT HFA) 110 MCG/ACT inhaler    Sig: Inhale 1 puff into the lungs 2 (two) times daily.    Dispense:  1 Inhaler    Refill:  12  . triamcinolone cream (KENALOG) 0.1 %    Sig: Apply 1 application topically 2 (two) times daily.    Dispense:  30 g    Refill:  1    Gail Dense, DO PGY-2, [facility] 07/10/2018 5:28 PM

## 2018-07-10 NOTE — Assessment & Plan Note (Signed)
Rash not completely consistent with eczema although could be a nummular variant.  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  Does not appear to be acutely infected, no pustules, surrounding erythema.  No dry scaly patches.  Will refill triamcinolone cream, also recommended daily use of Zyrtec rather than as needed.  Will need biopsy of lesions to determine true etiology, patient agreeable.

## 2018-07-16 ENCOUNTER — Telehealth: Payer: Self-pay

## 2018-07-16 NOTE — Telephone Encounter (Signed)
Pt called nurse line stating she was just seen in office by PCP for eczema and asthma. Pt stated she thought she requested the triamcinolone ointment in the "bigger" tube. Pt stated she was only given the 30g tube of cream, "this does not work for me." Pt also stated the Flovent given to her is not working, pt is requesting albuterol to be sent to her pharmacy. Please advise.

## 2018-07-19 ENCOUNTER — Other Ambulatory Visit: Payer: Self-pay | Admitting: Family Medicine

## 2018-07-19 MED ORDER — ALBUTEROL SULFATE HFA 108 (90 BASE) MCG/ACT IN AERS: 2.0000 | INHALATION_SPRAY | RESPIRATORY_TRACT | 1 refills | Status: DC | PRN

## 2018-07-19 MED ORDER — TRIAMCINOLONE ACETONIDE 0.1 % EX CREA: 1.0000 "application " | TOPICAL_CREAM | Freq: Two times a day (BID) | CUTANEOUS | 0 refills | Status: DC

## 2018-07-19 NOTE — Telephone Encounter (Signed)
Refill of albuterol sent, also sent bigger jar of kenalog. Please explain to patient that she has to take the flovent EVERY DAY in order to have effect from it, no matter if she is feeling good or bad, it works over time to decrease inflammation.

## 2018-07-20 NOTE — Telephone Encounter (Signed)
Pt informed. Deseree Blount, CMA  

## 2018-07-23 MED ORDER — TRIAMCINOLONE ACETONIDE 0.1 % EX OINT: TOPICAL_OINTMENT | Freq: Two times a day (BID) | CUTANEOUS | 1 refills | Status: DC | PRN

## 2018-07-23 NOTE — Telephone Encounter (Signed)
Pt calls back because she was given the cream again and wants the ointment.  I have resent ointment to pharmacy.  Pt informed. Jone Baseman, CMA

## 2018-07-23 NOTE — Progress Notes (Signed)
See previous phone note. Jone Baseman, CMA

## 2018-08-09 ENCOUNTER — Telehealth: Payer: Self-pay

## 2018-08-09 NOTE — Telephone Encounter (Signed)
Pt called nurse line stating the kenalog ointment is not working. Pt is requesting something "stronger" to try. I advised patient I would reach out to pcp, however she may want you to come in. Please advise.

## 2018-08-10 NOTE — Telephone Encounter (Signed)
Pt contacted and scheduled for ATC apt on Tuesday.

## 2018-08-10 NOTE — Telephone Encounter (Signed)
She will need to be seen, we will need to biopsy this. Encourage patient to also take zyrtec daily instead of prn to help with itching.

## 2018-08-14 ENCOUNTER — Ambulatory Visit: Payer: Medicaid Other

## 2018-09-28 ENCOUNTER — Other Ambulatory Visit: Payer: Self-pay | Admitting: Family Medicine

## 2018-10-11 ENCOUNTER — Telehealth: Payer: Self-pay

## 2018-10-11 NOTE — Telephone Encounter (Signed)
Patient calls nurse line stating triamcinolone ointment is not working well for her eczema. Patient stated she was told to call the office if she needed something stronger. Please advise.

## 2018-10-12 ENCOUNTER — Other Ambulatory Visit: Payer: Self-pay | Admitting: Family Medicine

## 2018-10-12 MED ORDER — BETAMETHASONE VALERATE 0.1 % EX OINT: 1.0000 "application " | TOPICAL_OINTMENT | Freq: Two times a day (BID) | CUTANEOUS | 0 refills | Status: DC

## 2018-10-12 NOTE — Telephone Encounter (Signed)
Will send in higher potency steroid ointment. If she still has issues with this, we will consider dermatology referral.

## 2018-10-12 NOTE — Telephone Encounter (Signed)
Spoke with pt. Informed her of note that was left. Pt then asked if it was a jar or tube because she would need the jar because she puts it all over her body. I advised pt to call the pharm to see what it was sent as, and to call us back if ointment doesn't. Pt understood. Salvatore Marvel, CMA

## 2018-10-30 ENCOUNTER — Other Ambulatory Visit: Payer: Self-pay

## 2018-10-30 ENCOUNTER — Telehealth (INDEPENDENT_AMBULATORY_CARE_PROVIDER_SITE_OTHER): Payer: Medicaid Other | Admitting: Family Medicine

## 2018-10-30 DIAGNOSIS — J4540 Moderate persistent asthma, uncomplicated: Secondary | ICD-10-CM | POA: Diagnosis not present

## 2018-10-30 MED ORDER — PREDNISONE 50 MG PO TABS: ORAL_TABLET | ORAL | 0 refills | Status: DC

## 2018-10-30 MED ORDER — BETAMETHASONE VALERATE 0.1 % EX OINT: 1.0000 "application " | TOPICAL_OINTMENT | Freq: Two times a day (BID) | CUTANEOUS | 0 refills | Status: DC

## 2018-11-02 NOTE — Progress Notes (Signed)
[place] Telemedicine Visit  Patient consented to have virtual visit. Method of visit: Telephone  Encounter participants: Patient: Gail Walker - located at home Provider: [facility]adalupe Dawn [facility] at [facility]  Chief Complaint: Asthma flare  HPI: 28 year old female presents telephone visit for asthma exacerbation.  Patient states for last 2-3 days she has had increased wheezing, shortness of breath.  Patient states that for this time.  She has been using her albuterol inhaler but has not had helping her much.  She has been taking her Flovent as prescribed.  States that she usually needs a dose of prednisone to get her through some like this.  She states that she has been having audible wheezing, and the albuterol is helping her some.  ROS: per HPI  Pertinent PMHx: Asthma  Exam:  General: No acute distress, comfortable on phone Respiratory: Able speak in clear coherent sentences, no respiratory distress evident on phone Psych: Appropriate, very pleasant, coherent thought process  Assessment/Plan:  Asthma Symptoms consistent with acute asthma exacerbation.  Encourage patient to continue take her albuterol as needed.  Also continue her Flovent as prescribed.  We will give her a 5-day course of prednisone 50 mg daily.  Patient to follow-up in clinic if symptoms not resolved.    Time spent during visit with patient: 12 minutes

## 2018-11-02 NOTE — Assessment & Plan Note (Signed)
Symptoms consistent with acute asthma exacerbation.  Encourage patient to continue take her albuterol as needed.  Also continue her Flovent as prescribed.  We will give her a 5-day course of prednisone 50 mg daily.  Patient to follow-up in clinic if symptoms not resolved.

## 2018-11-13 ENCOUNTER — Other Ambulatory Visit: Payer: Self-pay | Admitting: Family Medicine

## 2018-11-13 MED ORDER — ALBUTEROL SULFATE HFA 108 (90 BASE) MCG/ACT IN AERS: 2.0000 | INHALATION_SPRAY | RESPIRATORY_TRACT | 1 refills | Status: DC | PRN

## 2018-11-21 ENCOUNTER — Ambulatory Visit: Payer: Medicaid Other | Admitting: Family Medicine

## 2018-11-21 NOTE — Progress Notes (Deleted)
  Subjective:   Patient ID: Gail Walker    DOB: 1990/11/26, 28 y.o. female   MRN: [id number]  Ilya Ess is a 28 y.o. female with a history of asthma, allergic rhinitis, eczema, obesity, tobacco use, depression here for   Pap smear - negative pap 2014 - contraception: *** - recently pregnant with IUFD s/p D&C 05/16/18 - denies abnl vaginal discharge, bleeding.  - ***no concern for STIs.  Review of Systems:  Per HPI.  [place], medications and smoking status reviewed.  Objective:   LMP  (LMP Unknown)  Vitals and nursing note reviewed.  General: well nourished, well developed, in no acute distress with non-toxic appearance HEENT: normocephalic, atraumatic, moist mucous membranes Neck: supple, non-tender without lymphadenopathy CV: regular rate and rhythm without murmurs, rubs, or gallops, no lower extremity edema Lungs: clear to auscultation bilaterally with normal work of breathing Abdomen: soft, non-tender, non-distended, no masses or organomegaly palpable, normoactive bowel sounds Skin: warm, dry, no rashes or lesions Extremities: warm and well perfused, normal tone MSK: ROM grossly intact, strength intact, gait normal Neuro: Alert and oriented, speech normal  Assessment & Plan:   No problem-specific Assessment & Plan notes found for this encounter.  No orders of the defined types were placed in this encounter.  No orders of the defined types were placed in this encounter.   Rory Percy, DO PGY-3, [facility] 11/21/2018 1:36 PM

## 2018-11-22 ENCOUNTER — Telehealth: Payer: Medicaid Other | Admitting: Family Medicine

## 2018-11-26 ENCOUNTER — Other Ambulatory Visit: Payer: Self-pay

## 2018-11-26 ENCOUNTER — Telehealth: Payer: Medicaid Other | Admitting: Family Medicine

## 2018-11-26 DIAGNOSIS — J4540 Moderate persistent asthma, uncomplicated: Secondary | ICD-10-CM

## 2018-11-26 MED ORDER — FLUTICASONE PROPIONATE HFA 110 MCG/ACT IN AERO: 1.0000 | INHALATION_SPRAY | Freq: Two times a day (BID) | RESPIRATORY_TRACT | 12 refills | Status: DC

## 2018-11-26 MED ORDER — BETAMETHASONE VALERATE 0.1 % EX OINT: TOPICAL_OINTMENT | Freq: Two times a day (BID) | CUTANEOUS | 1 refills | Status: DC

## 2018-11-26 MED ORDER — ALBUTEROL SULFATE (2.5 MG/3ML) 0.083% IN NEBU: 5.0000 mg | INHALATION_SOLUTION | RESPIRATORY_TRACT | 1 refills | Status: DC | PRN

## 2018-11-26 MED ORDER — ALBUTEROL SULFATE HFA 108 (90 BASE) MCG/ACT IN AERS: 2.0000 | INHALATION_SPRAY | RESPIRATORY_TRACT | 1 refills | Status: DC | PRN

## 2018-11-26 NOTE — Progress Notes (Signed)
Patient needs refills of her inhalers and steroid ointment sent to her pharmacy.  Is tolerating them well without concerns or complaints.  No charge for telemedicine visit.  Rory Percy, DO PGY-3, [facility] 11/26/2018 4:37 PM

## 2018-12-21 ENCOUNTER — Other Ambulatory Visit: Payer: Self-pay

## 2018-12-21 NOTE — Telephone Encounter (Signed)
Patient calls nurse line requesting a refill on Prednisone, states her asthma has been acting up. Patient also wants to know if Morrie Sheldon has a higher strength she could try? Please advise.

## 2018-12-22 NOTE — Telephone Encounter (Signed)
Patient needs appointment if she is still having symptoms after recent steroid burst.   The betamethasone is already high potency. If this is not helping, we should either see her in derm clinic or refer to dermatology as we have discussed biopsy of these areas in the past.

## 2018-12-25 NOTE — Telephone Encounter (Signed)
Patient LVM on nurse line returning Jazmins call.

## 2018-12-25 NOTE — Telephone Encounter (Signed)
LM for patient to call back and make an appointment to follow up on asthma.  Virginio Isidore,CMA

## 2018-12-26 ENCOUNTER — Telehealth (INDEPENDENT_AMBULATORY_CARE_PROVIDER_SITE_OTHER): Payer: Medicaid Other | Admitting: Family Medicine

## 2018-12-26 ENCOUNTER — Other Ambulatory Visit: Payer: Self-pay

## 2018-12-26 DIAGNOSIS — J4540 Moderate persistent asthma, uncomplicated: Secondary | ICD-10-CM

## 2018-12-26 MED ORDER — PREDNISONE 50 MG PO TABS: ORAL_TABLET | ORAL | 0 refills | Status: DC

## 2018-12-26 MED ORDER — DULERA 100-5 MCG/ACT IN AERO: 2.0000 | INHALATION_SPRAY | Freq: Two times a day (BID) | RESPIRATORY_TRACT | 1 refills | Status: DC

## 2018-12-26 MED ORDER — ALBUTEROL SULFATE HFA 108 (90 BASE) MCG/ACT IN AERS: 2.0000 | INHALATION_SPRAY | RESPIRATORY_TRACT | 1 refills | Status: DC | PRN

## 2018-12-26 NOTE — Telephone Encounter (Signed)
LM for patient.  She will need an appt to follow up on her asthma.  Ok per Dr. Ky Barban for patient to come into the office as long as she doesn't have any other symptoms.  Ophia Shamoon,CMA

## 2018-12-26 NOTE — Assessment & Plan Note (Signed)
-   Refilled Albuterol; take as needed but discussed for the next few days to okay to take scheduled every 4 hours, then 8 hours, then as needed - Starting on [facility] for a controller medication given she has called in about multiple exacerbations in the past few months - Refilled prednisone 50mg  daily for 5 days

## 2018-12-26 NOTE — Progress Notes (Signed)
[place] Telemedicine Visit  Patient consented to have virtual visit. Method of visit: Telephone  Encounter participants: Patient: Gail Walker - located at home in [facility] Provider: Nuala Alpha - located at [facility] Others (if applicable): none  Chief Complaint: "asthma acting up"  HPI: Patient is a 28y/o female with PMH of asthma who calls today stating she has been using her albuterol everyday multiple times per day for the last few days. She was treated with prednisone over one month ago and it helped but now she feels like she is "right back where I started". She reports some chest tightness and SOB that is relieved with albuterol and rest.   ROS: per HPI  Pertinent PMHx: Asthma  Exam:  Respiratory: speaking in full sentences, not stopping to take a breath to finish speaking  Assessment/Plan:  Asthma - Refilled Albuterol; take as needed but discussed for the next few days to okay to take scheduled every 4 hours, then 8 hours, then as needed - Starting on [facility] for a controller medication given she has called in about multiple exacerbations in the past few months - Refilled prednisone 50mg  daily for 5 days    Time spent during visit with patient: >10 minutes  Harolyn Rutherford, DO [place], PGY-3

## 2018-12-26 NOTE — Telephone Encounter (Signed)
Patient calls back- I scheduled her with [place] this am. Patient does want to be referred to [facility]. Please advise.

## 2018-12-27 ENCOUNTER — Other Ambulatory Visit: Payer: Self-pay | Admitting: Family Medicine

## 2018-12-27 DIAGNOSIS — R21 Rash and other nonspecific skin eruption: Secondary | ICD-10-CM

## 2018-12-27 DIAGNOSIS — L300 Nummular dermatitis: Secondary | ICD-10-CM

## 2018-12-27 NOTE — Telephone Encounter (Signed)
Referral placed.

## 2019-01-26 ENCOUNTER — Other Ambulatory Visit: Payer: Self-pay

## 2019-01-26 ENCOUNTER — Emergency Department (HOSPITAL_COMMUNITY)
Admission: EM | Admit: 2019-01-26 | Discharge: 2019-01-26 | Disposition: A | Discharge status: Home / Self Care | Payer: Medicaid Other | Attending: Emergency Medicine | Admitting: Emergency Medicine

## 2019-01-26 ENCOUNTER — Encounter (HOSPITAL_COMMUNITY): Payer: Self-pay | Admitting: *Deleted

## 2019-01-26 DIAGNOSIS — M549 Dorsalgia, unspecified: Secondary | ICD-10-CM | POA: Diagnosis present

## 2019-01-26 DIAGNOSIS — R05 Cough: Secondary | ICD-10-CM | POA: Insufficient documentation

## 2019-01-26 DIAGNOSIS — R062 Wheezing: Secondary | ICD-10-CM | POA: Diagnosis not present

## 2019-01-26 DIAGNOSIS — Z5321 Procedure and treatment not carried out due to patient leaving prior to being seen by health care provider: Secondary | ICD-10-CM | POA: Insufficient documentation

## 2019-01-26 NOTE — ED Triage Notes (Signed)
Pt arrives via EMS with c/o back pain. Pt has had a recent cough. No fevers, chills, or muscle aches. Hx of asthma, has been using her inhaler with only minimal relief. Wheezing throughout lung fields. 138/93, hr 98, 22r, 100% RA, 97.5 temp.

## 2019-01-26 NOTE — ED Triage Notes (Signed)
Pt reporting she has had a cold this week associated with a cough. No cough today. Woke up this morning with pressure in her chest and back that is exacerbated by movement. She took ibuprofen with some relief. Has had wheezing, using her inhaler.

## 2019-03-03 ENCOUNTER — Inpatient Hospital Stay (HOSPITAL_COMMUNITY)
Admission: AD | Admit: 2019-03-03 | Discharge: 2019-03-03 | Disposition: A | Discharge status: Home / Self Care | Payer: Medicaid Other | Attending: Obstetrics & Gynecology | Admitting: Obstetrics & Gynecology

## 2019-03-03 ENCOUNTER — Other Ambulatory Visit: Payer: Self-pay

## 2019-03-03 DIAGNOSIS — Z3201 Encounter for pregnancy test, result positive: Secondary | ICD-10-CM | POA: Insufficient documentation

## 2019-03-03 NOTE — MAU Provider Note (Signed)
First Provider Initiated Contact with Patient 03/03/19 2006     S [name] is a 28 y.o. G12P1011 female who presents to MAU today for pregnancy confirmation after a positive home pregnancy test. She denies abdominal pain, back pain, vaginal bleeding or other obstetric concerns.  O BP 130/82 (BP Location: Right Arm)   Pulse (!) 104   Temp 98.6 F (37 C) (Oral)   Resp 18   Ht 5\' 4"  (1.626 m)   Wt 119.8 kg   LMP  (LMP Unknown)   SpO2 100%   BMI 45.35 kg/m    Physical Exam  Nursing note and vitals reviewed. Constitutional: She is oriented to person, place, and time. She appears well-developed and well-nourished.  Cardiovascular: Normal rate.  Respiratory: Effort normal.  Neurological: She is alert and oriented to person, place, and time.  Skin: Skin is warm and dry.  Psychiatric: She has a normal mood and affect. Her behavior is normal. Judgment and thought content normal.   A Positive HPT, reassured patient of accuracy No pregnancy-related concerns Medical screening exam complete  P Discharge from MAU in stable condition List of options for follow-up given  Warning signs for worsening condition that would warrant emergency follow-up discussed Patient may return to MAU as needed for pregnancy related complaints  Mallie Snooks, CNM 03/03/2019 8:13 PM

## 2019-03-03 NOTE — MAU Note (Signed)
Pt here for missed period. Thinks she is pregnant as she had a +upt at home. Had a recent miscarriage in February and wants to make sure everything is okay. Pt denies pain or bleeding. Unsure of LMP.

## 2019-03-06 ENCOUNTER — Other Ambulatory Visit: Payer: Self-pay

## 2019-03-06 ENCOUNTER — Ambulatory Visit: Payer: Medicaid Other | Admitting: Family Medicine

## 2019-03-06 VITALS — BP 127/70 | HR 107 | Wt 264.6 lb

## 2019-03-06 DIAGNOSIS — N926 Irregular menstruation, unspecified: Secondary | ICD-10-CM

## 2019-03-06 DIAGNOSIS — Z3201 Encounter for pregnancy test, result positive: Secondary | ICD-10-CM | POA: Insufficient documentation

## 2019-03-06 DIAGNOSIS — Z3490 Encounter for supervision of normal pregnancy, unspecified, unspecified trimester: Secondary | ICD-10-CM | POA: Diagnosis not present

## 2019-03-06 LAB — POCT URINE PREGNANCY: Preg Test, Ur: POSITIVE — AB

## 2019-03-06 NOTE — Assessment & Plan Note (Signed)
Pregnancy confirmed via U Preg today.  - Message sent to front office to schedule for initial OB visit - OB dating U/S ordered - OB first trimester labs ordered along with OB Urine Cx and test for sickle cell  today - Starting prenatal vitamin OTC - Will stop smoking; please follow up at initial OB visit

## 2019-03-06 NOTE — Patient Instructions (Signed)
First Trimester of Pregnancy ° °The first trimester of pregnancy is from week 1 until the end of week 13 (months 1 through 3). During this time, your baby will begin to develop inside you. At 6-8 weeks, the eyes and face are formed, and the heartbeat can be seen on ultrasound. At the end of 12 weeks, all the baby's organs are formed. Prenatal care is all the medical care you receive before the birth of your baby. Make sure you get good prenatal care and follow all of your doctor's instructions. °Follow these instructions at home: °Medicines °· Take over-the-counter and prescription medicines only as told by your doctor. Some medicines are safe and some medicines are not safe during pregnancy. °· Take a prenatal vitamin that contains at least 600 micrograms (mcg) of folic acid. °· If you have trouble pooping (constipation), take medicine that will make your stool soft (stool softener) if your doctor approves. °Eating and drinking ° °· Eat regular, healthy meals. °· Your doctor will tell you the amount of weight gain that is right for you. °· Avoid raw meat and uncooked cheese. °· If you feel sick to your stomach (nauseous) or throw up (vomit): °? Eat 4 or 5 small meals a day instead of 3 large meals. °? Try eating a few soda crackers. °? Drink liquids between meals instead of during meals. °· To prevent constipation: °? Eat foods that are high in fiber, like fresh fruits and vegetables, whole grains, and beans. °? Drink enough fluids to keep your pee (urine) clear or pale yellow. °Activity °· Exercise only as told by your doctor. Stop exercising if you have cramps or pain in your lower belly (abdomen) or low back. °· Do not exercise if it is too hot, too humid, or if you are in a place of great height (high altitude). °· Try to avoid standing for long periods of time. Move your legs often if you must stand in one place for a long time. °· Avoid heavy lifting. °· Wear low-heeled shoes. Sit and stand up  straight. °· You can have sex unless your doctor tells you not to. °Relieving pain and discomfort °· Wear a good support bra if your breasts are sore. °· Take warm water baths (sitz baths) to soothe pain or discomfort caused by hemorrhoids. Use hemorrhoid cream if your doctor says it is okay. °· Rest with your legs raised if you have leg cramps or low back pain. °· If you have puffy, bulging veins (varicose veins) in your legs: °? Wear support hose or compression stockings as told by your doctor. °? Raise (elevate) your feet for 15 minutes, 3-4 times a day. °? Limit salt in your food. °Prenatal care °· Schedule your prenatal visits by the twelfth week of pregnancy. °· Write down your questions. Take them to your prenatal visits. °· Keep all your prenatal visits as told by your doctor. This is important. °Safety °· Wear your seat belt at all times when driving. °· Make a list of emergency phone numbers. The list should include numbers for family, friends, the hospital, and police and fire departments. °General instructions °· Ask your doctor for a referral to a local prenatal class. Begin classes no later than at the start of month 6 of your pregnancy. °· Ask for help if you need counseling or if you need help with nutrition. Your doctor can give you advice or tell you where to go for help. °· Do not use hot tubs, steam   rooms, or saunas. °· Do not douche or use tampons or scented sanitary pads. °· Do not cross your legs for long periods of time. °· Avoid all herbs and alcohol. Avoid drugs that are not approved by your doctor. °· Do not use any tobacco products, including cigarettes, chewing tobacco, and electronic cigarettes. If you need help quitting, ask your doctor. You may get counseling or other support to help you quit. °· Avoid cat litter boxes and soil used by cats. These carry germs that can cause birth defects in the baby and can cause a loss of your baby (miscarriage) or stillbirth. °· Visit your dentist.  At home, brush your teeth with a soft toothbrush. Be gentle when you floss. °Contact a doctor if: °· You are dizzy. °· You have mild cramps or pressure in your lower belly. °· You have a nagging pain in your belly area. °· You continue to feel sick to your stomach, you throw up, or you have watery poop (diarrhea). °· You have a bad smelling fluid coming from your vagina. °· You have pain when you pee (urinate). °· You have increased puffiness (swelling) in your face, hands, legs, or ankles. °Get help right away if: °· You have a fever. °· You are leaking fluid from your vagina. °· You have spotting or bleeding from your vagina. °· You have very bad belly cramping or pain. °· You gain or lose weight rapidly. °· You throw up blood. It may look like coffee grounds. °· You are around people who have [place] measles, fifth disease, or chickenpox. °· You have a very bad headache. °· You have shortness of breath. °· You have any kind of trauma, such as from a fall or a car accident. °Summary °· The first trimester of pregnancy is from week 1 until the end of week 13 (months 1 through 3). °· To take care of yourself and your unborn baby, you will need to eat healthy meals, take medicines only if your doctor tells you to do so, and do activities that are safe for you and your baby. °· Keep all follow-up visits as told by your doctor. This is important as your doctor will have to ensure that your baby is healthy and growing well. °This information is not intended to replace advice given to you by your health care provider. Make sure you discuss any questions you have with your health care provider. °Document Released: 09/21/2007 Document Revised: 07/26/2018 Document Reviewed: 04/12/2016 °Elsevier Patient Education © 2020 [employer] ° °

## 2019-03-06 NOTE — Progress Notes (Signed)
     Subjective: Chief Complaint  Patient presents with  . Possible Pregnancy   HPI: Gail Walker is a 28 y.o. presenting to clinic today to discuss the following:  Concern for pregnancy Patient presents today for confirmation of an at home pregnancy test and to establish care here for her pregnancy. It was positive and patient expressed desire to continue with pregnancy and has a good support system. She does smoke and she states she will quit during the pregnancy. She is having some mild morning sickness. No abdominal pain or vaginal bleeding.  Her last recalled menustral period was 01/09/2019 but she is unsure of the date. History of 5 week spontaneous abortion. One other pregnancy which she carried to term. No c-sections, no previous gestational diabets or hypertension. She will start taking her prenatal vitamin.      ROS noted in HPI.    Social History   Tobacco Use  Smoking Status Former Smoker  . Packs/day: 0.25  . Years: 3.00  . Pack years: 0.75  . Types: Cigarettes  . Quit date: 02/07/2018  . Years since quitting: 1.0  Smokeless Tobacco Never Used    Objective: BP 127/70   Pulse (!) 107   Wt 264 lb 9.6 oz (120 kg)   LMP 01/09/2019 (Approximate)   SpO2 100%   BMI 45.42 kg/m  Vitals and nursing notes reviewed  Physical Exam Gen: Alert and Oriented x 3, NAD Abd: non-distended, non-tender, soft, +bs in all four quadrants Ext: no clubbing, cyanosis, or edema Skin: warm, dry, intact, no rashes  Results for orders placed or performed in visit on 03/06/19 (from the past 72 hour(s))  POCT urine pregnancy     Status: Abnormal   Collection Time: 03/06/19  2:40 PM  Result Value Ref Range   Preg Test, Ur Positive (A) Negative    Assessment/Plan:  Positive pregnancy test Pregnancy confirmed via U Preg today.  - Message sent to front office to schedule for initial OB visit - OB dating U/S ordered - OB first trimester labs ordered along with OB Urine Cx and  test for sickle cell  today - Starting prenatal vitamin OTC - Will stop smoking; please follow up at initial OB visit - F/u on 03/21/2019 with Dr. Ky Barban   PATIENT EDUCATION PROVIDED: See AVS    Diagnosis and plan along with any newly prescribed medication(s) were discussed in detail with this patient today. The patient verbalized understanding and agreed with the plan. Patient advised if symptoms worsen return to clinic or ER.    Orders Placed This Encounter  Procedures  . Culture, OB Urine  . [place] OP OB Comp Less 14 Wks    Standing Status:   Future    Standing Expiration Date:   05/05/2020    Order Specific Question:   Reason for Exam (SYMPTOM  OR DIAGNOSIS REQUIRED)    Answer:   pregnant    Order Specific Question:   Preferred imaging location?    Answer:   [facility]  . Obstetric Panel, Including HIV([facility])  . HGB FRAC. W/SOLUBILITY  . POCT urine pregnancy    No orders of the defined types were placed in this encounter.    Harolyn Rutherford, DO 03/06/2019, 2:54 PM PGY-3 [place]

## 2019-03-07 ENCOUNTER — Telehealth: Payer: Self-pay | Admitting: Family Medicine

## 2019-03-07 NOTE — Telephone Encounter (Signed)
Pt is calling to see what her lab results were and also to find out when her ultra sound will be scheduled. jw

## 2019-03-07 NOTE — Telephone Encounter (Signed)
LM for patient to call back.  Will update on labs and see what dates and times she is available for the ultrasound when she calls back.  Jazmin Hartsell,CMA

## 2019-03-07 NOTE — Telephone Encounter (Signed)
Attempted to call patient with appointment for ultrasound at [facility] on 03/20/2019 @1400  with an arrival at 1345.  Patient is to arrive with a full bladder.  There was no answer.  If patient calls back please give her appointment time.  Ozella Almond, [place]

## 2019-03-07 NOTE — Telephone Encounter (Signed)
Patient called back and I updated her on her labs and that there are still a couple that are pending.  She is aware that the rest will be back in a few days.  Also she will be receiving a call regarding her ultrasound appointment.  Padme Arriaga,CMA

## 2019-03-07 NOTE — Telephone Encounter (Signed)
Will forward to Dr. Garlan Fillers who saw patient for this visit. Jazmin Hartsell,CMA

## 2019-03-08 LAB — OBSTETRIC PANEL, INCLUDING HIV
Antibody Screen: NEGATIVE
Basophils Absolute: 0 10*3/uL (ref 0.0–0.2)
Basos: 1 %
EOS (ABSOLUTE): 0.3 10*3/uL (ref 0.0–0.4)
Eos: 5 %
HIV Screen 4th Generation wRfx: NONREACTIVE
Hematocrit: 39.1 % (ref 34.0–46.6)
Hemoglobin: 12.4 g/dL (ref 11.1–15.9)
Hepatitis B Surface Ag: NEGATIVE
Immature Grans (Abs): 0 10*3/uL (ref 0.0–0.1)
Immature Granulocytes: 0 %
Lymphocytes Absolute: 1.7 10*3/uL (ref 0.7–3.1)
Lymphs: 26 %
MCH: 26 pg — ABNORMAL LOW (ref 26.6–33.0)
MCHC: 31.7 g/dL (ref 31.5–35.7)
MCV: 82 fL (ref 79–97)
Monocytes Absolute: 0.4 10*3/uL (ref 0.1–0.9)
Monocytes: 6 %
Neutrophils Absolute: 4.1 10*3/uL (ref 1.4–7.0)
Neutrophils: 62 %
Platelets: 348 10*3/uL (ref 150–450)
RBC: 4.77 x10E6/uL (ref 3.77–5.28)
RDW: 13.9 % (ref 11.7–15.4)
RPR Ser Ql: NONREACTIVE
Rh Factor: NEGATIVE
Rubella Antibodies, IGG: 1.84 index (ref >0.99)
WBC: 6.7 10*3/uL (ref 3.4–10.8)

## 2019-03-08 LAB — HGB FRAC. W/SOLUBILITY
Hgb A2 Quant: 2.3 % (ref 1.8–3.2)
Hgb A: 97.7 % (ref 96.4–98.8)
Hgb C: 0 %
Hgb F Quant: 0 % (ref 0.0–2.0)
Hgb S: 0 %
Hgb Solubility: NEGATIVE
Hgb Variant: 0 %

## 2019-03-09 LAB — CULTURE, OB URINE

## 2019-03-09 LAB — URINE CULTURE, OB REFLEX

## 2019-03-20 ENCOUNTER — Other Ambulatory Visit: Payer: Self-pay | Admitting: Family Medicine

## 2019-03-20 ENCOUNTER — Other Ambulatory Visit: Payer: Self-pay

## 2019-03-20 ENCOUNTER — Ambulatory Visit (HOSPITAL_COMMUNITY)
Admission: RE | Admit: 2019-03-20 | Discharge: 2019-03-20 | Disposition: A | Discharge status: Home / Self Care | Payer: Medicaid Other | Source: Ambulatory Visit | Attending: Family Medicine | Admitting: Family Medicine

## 2019-03-20 DIAGNOSIS — Z3490 Encounter for supervision of normal pregnancy, unspecified, unspecified trimester: Secondary | ICD-10-CM | POA: Insufficient documentation

## 2019-03-21 ENCOUNTER — Encounter: Payer: Self-pay | Admitting: Family Medicine

## 2019-03-21 ENCOUNTER — Other Ambulatory Visit: Payer: Self-pay

## 2019-03-21 ENCOUNTER — Ambulatory Visit (INDEPENDENT_AMBULATORY_CARE_PROVIDER_SITE_OTHER): Payer: Medicaid Other | Admitting: Family Medicine

## 2019-03-21 ENCOUNTER — Other Ambulatory Visit (HOSPITAL_COMMUNITY)
Admission: RE | Admit: 2019-03-21 | Discharge: 2019-03-21 | Disposition: A | Discharge status: Home / Self Care | Payer: Medicaid Other | Source: Ambulatory Visit | Attending: Family Medicine | Admitting: Family Medicine

## 2019-03-21 VITALS — BP 114/64 | HR 106 | Wt 261.4 lb

## 2019-03-21 DIAGNOSIS — Z3481 Encounter for supervision of other normal pregnancy, first trimester: Secondary | ICD-10-CM

## 2019-03-21 DIAGNOSIS — Z3A01 Less than 8 weeks gestation of pregnancy: Secondary | ICD-10-CM | POA: Insufficient documentation

## 2019-03-21 DIAGNOSIS — F17200 Nicotine dependence, unspecified, uncomplicated: Secondary | ICD-10-CM

## 2019-03-21 MED ORDER — BETAMETHASONE VALERATE 0.1 % EX OINT: TOPICAL_OINTMENT | Freq: Two times a day (BID) | CUTANEOUS | 1 refills | Status: DC

## 2019-03-21 MED ORDER — PRENATAL VITAMIN 27-0.8 MG PO TABS: 1.0000 | ORAL_TABLET | Freq: Every day | ORAL | 3 refills | Status: DC

## 2019-03-21 MED ORDER — DOXYLAMINE-PYRIDOXINE 10-10 MG PO TBEC: DELAYED_RELEASE_TABLET | ORAL | 1 refills | Status: DC

## 2019-03-21 MED ORDER — CEPHALEXIN 500 MG PO CAPS: 500.0000 mg | ORAL_CAPSULE | Freq: Two times a day (BID) | ORAL | 0 refills | Status: AC

## 2019-03-21 NOTE — Patient Instructions (Signed)
It was great to see you!  Our plans for today:  - Congratulations on quitting smoking! This is the best thing you can do for you and your baby's health. - I sent prescriptions of the prenatal vitamins and a refill of your steroid ointment for your eczema to the pharmacy. - Take doxycyclamine (Diclegis) as directed for nausea. - Take cefalexin (Keflex) for the bacteria in your urine. - We ordered your genetic screening, the appointment is at 12/22 at 2:30pm. - We updated your pap smear today, we will call you with these results. - Follow up in 4 weeks, at that time we will do your sugar test.  Take care and seek immediate care sooner if you develop any concerns.   Dr. Mollie Germany[facility]ball [facility]   First Trimester of Pregnancy The first trimester of pregnancy is from week 1 until the end of week 13 (months 1 through 3). A week after a sperm fertilizes an egg, the egg will implant on the wall of the uterus. This embryo will begin to develop into a baby. Genes from you and your partner will form the baby. The female genes will determine whether the baby will be a boy or a girl. At 6-8 weeks, the eyes and face will be formed, and the heartbeat can be seen on ultrasound. At the end of 12 weeks, all the baby's organs will be formed. Now that you are pregnant, you will want to do everything you can to have a healthy baby. Two of the most important things are to get good prenatal care and to follow your health care provider's instructions. Prenatal care is all the medical care you receive before the baby's birth. This care will help prevent, find, and treat any problems during the pregnancy and childbirth. Body changes during your first trimester Your body goes through many changes during pregnancy. The changes vary from woman to woman.  You may gain or lose a couple of pounds at first.  You may feel sick to your stomach (nauseous) and you may throw up (vomit). If the vomiting is uncontrollable, call  your health care provider.  You may tire easily.  You may develop headaches that can be relieved by medicines. All medicines should be approved by your health care provider.  You may urinate more often. Painful urination may mean you have a bladder infection.  You may develop heartburn as a result of your pregnancy.  You may develop constipation because certain hormones are causing the muscles that push stool through your intestines to slow down.  You may develop hemorrhoids or swollen veins (varicose veins).  Your breasts may begin to grow larger and become tender. Your nipples may stick out more, and the tissue that surrounds them (areola) may become darker.  Your gums may bleed and may be sensitive to brushing and flossing.  Dark spots or blotches (chloasma, mask of pregnancy) may develop on your face. This will likely fade after the baby is born.  Your menstrual periods will stop.  You may have a loss of appetite.  You may develop cravings for certain kinds of food.  You may have changes in your emotions from day to day, such as being excited to be pregnant or being concerned that something may go wrong with the pregnancy and baby.  You may have more vivid and strange dreams.  You may have changes in your hair. These can include thickening of your hair, rapid growth, and changes in texture. Some women also  have hair loss during or after pregnancy, or hair that feels dry or thin. Your hair will most likely return to normal after your baby is born. What to expect at prenatal visits During a routine prenatal visit:  You will be weighed to make sure you and the baby are growing normally.  Your blood pressure will be taken.  Your abdomen will be measured to track your baby's growth.  The fetal heartbeat will be listened to between weeks 10 and 14 of your pregnancy.  Test results from any previous visits will be discussed. Your health care provider may ask you:  How you are  feeling.  If you are feeling the baby move.  If you have had any abnormal symptoms, such as leaking fluid, bleeding, severe headaches, or abdominal cramping.  If you are using any tobacco products, including cigarettes, chewing tobacco, and electronic cigarettes.  If you have any questions. Other tests that may be performed during your first trimester include:  Blood tests to find your blood type and to check for the presence of any previous infections. The tests will also be used to check for low iron levels (anemia) and protein on red blood cells (Rh antibodies). Depending on your risk factors, or if you previously had diabetes during pregnancy, you may have tests to check for high blood sugar that affects pregnant women (gestational diabetes).  Urine tests to check for infections, diabetes, or protein in the urine.  An ultrasound to confirm the proper growth and development of the baby.  Fetal screens for spinal cord problems (spina bifida) and Down syndrome.  HIV (human immunodeficiency virus) testing. Routine prenatal testing includes screening for HIV, unless you choose not to have this test.  You may need other tests to make sure you and the baby are doing well. Follow these instructions at home: Medicines  Follow your health care provider's instructions regarding medicine use. Specific medicines may be either safe or unsafe to take during pregnancy.  Take a prenatal vitamin that contains at least 600 micrograms (mcg) of folic acid.  If you develop constipation, try taking a stool softener if your health care provider approves. Eating and drinking   Eat a balanced diet that includes fresh fruits and vegetables, whole grains, good sources of protein such as meat, eggs, or tofu, and low-fat dairy. Your health care provider will help you determine the amount of weight gain that is right for you.  Avoid raw meat and uncooked cheese. These carry germs that can cause birth defects  in the baby.  Eating four or five small meals rather than three large meals a day may help relieve nausea and vomiting. If you start to feel nauseous, eating a few soda crackers can be helpful. Drinking liquids between meals, instead of during meals, also seems to help ease nausea and vomiting.  Limit foods that are high in fat and processed sugars, such as fried and sweet foods.  To prevent constipation: ? Eat foods that are high in fiber, such as fresh fruits and vegetables, whole grains, and beans. ? Drink enough fluid to keep your urine clear or pale yellow. Activity  Exercise only as directed by your health care provider. Most women can continue their usual exercise routine during pregnancy. Try to exercise for 30 minutes at least 5 days a week. Exercising will help you: ? Control your weight. ? Stay in shape. ? Be prepared for labor and delivery.  Experiencing pain or cramping in the lower abdomen or  lower back is a good sign that you should stop exercising. Check with your health care provider before continuing with normal exercises.  Try to avoid standing for long periods of time. Move your legs often if you must stand in one place for a long time.  Avoid heavy lifting.  Wear low-heeled shoes and practice good posture.  You may continue to have sex unless your health care provider tells you not to. Relieving pain and discomfort  Wear a good support bra to relieve breast tenderness.  Take warm sitz baths to soothe any pain or discomfort caused by hemorrhoids. Use hemorrhoid cream if your health care provider approves.  Rest with your legs elevated if you have leg cramps or low back pain.  If you develop varicose veins in your legs, wear support hose. Elevate your feet for 15 minutes, 3-4 times a day. Limit salt in your diet. Prenatal care  Schedule your prenatal visits by the twelfth week of pregnancy. They are usually scheduled monthly at first, then more often in the last  2 months before delivery.  Write down your questions. Take them to your prenatal visits.  Keep all your prenatal visits as told by your health care provider. This is important. Safety  Wear your seat belt at all times when driving.  Make a list of emergency phone numbers, including numbers for family, friends, the hospital, and police and fire departments. General instructions  Ask your health care provider for a referral to a local prenatal education class. Begin classes no later than the beginning of month 6 of your pregnancy.  Ask for help if you have counseling or nutritional needs during pregnancy. Your health care provider can offer advice or refer you to specialists for help with various needs.  Do not use hot tubs, steam rooms, or saunas.  Do not douche or use tampons or scented sanitary pads.  Do not cross your legs for long periods of time.  Avoid cat litter boxes and soil used by cats. These carry germs that can cause birth defects in the baby and possibly loss of the fetus by miscarriage or stillbirth.  Avoid all smoking, herbs, alcohol, and medicines not prescribed by your health care provider. Chemicals in these products affect the formation and growth of the baby.  Do not use any products that contain nicotine or tobacco, such as cigarettes and e-cigarettes. If you need help quitting, ask your health care provider. You may receive counseling support and other resources to help you quit.  Schedule a dentist appointment. At home, brush your teeth with a soft toothbrush and be gentle when you floss. Contact a health care provider if:  You have dizziness.  You have mild pelvic cramps, pelvic pressure, or nagging pain in the abdominal area.  You have persistent nausea, vomiting, or diarrhea.  You have a bad smelling vaginal discharge.  You have pain when you urinate.  You notice increased swelling in your face, hands, legs, or ankles.  You are exposed to fifth  disease or chickenpox.  You are exposed to [place] measles (rubella) and have never had it. Get help right away if:  You have a fever.  You are leaking fluid from your vagina.  You have spotting or bleeding from your vagina.  You have severe abdominal cramping or pain.  You have rapid weight gain or loss.  You vomit blood or material that looks like coffee grounds.  You develop a severe headache.  You have shortness of breath.  You have any kind of trauma, such as from a fall or a car accident. Summary  The first trimester of pregnancy is from week 1 until the end of week 13 (months 1 through 3).  Your body goes through many changes during pregnancy. The changes vary from woman to woman.  You will have routine prenatal visits. During those visits, your health care provider will examine you, discuss any test results you may have, and talk with you about how you are feeling. This information is not intended to replace advice given to you by your health care provider. Make sure you discuss any questions you have with your health care provider. Document Released: 03/29/2001 Document Revised: 03/17/2017 Document Reviewed: 03/16/2016 Elsevier Patient Education  2020 [employer].

## 2019-03-21 NOTE — Progress Notes (Signed)
Patient Name: Gail Walker Date of Birth: 11-19-1990 [facility] Initial Prenatal   Gail Walker is a 28 y.o. year old [id number] at 101w4d by early [place] who presents for her initial prenatal visit. Pregnancy is not planned. Is pleased to be pregnant. Has good support, lives with son and boyfriend. She reports morning sickness, nausea and positive home pregnancy test. She is not taking a prenatal vitamin.  She denies pelvic pain or vaginal bleeding.   The patient is dated by early [place].  LMP: 01/09/2019 Period is certain:  No.  Periods are regular:  Yes.  Period was typical period:  Yes.   Lab Review: Blood type: A Rh Status: - Antibody screen Negative HIV Negative RPR Negative Hemoglobin electrophoresis reviewed Yes Results of Ob urine culture are Positive for 10-25k colonies of staph hemolyticus  Rubella Immune Varicella status is Immune.   PMH: Reviewed and as detailed below: HTN: No  Type 1 or 2 Diabetes: No   Depression:  Yes, previously. No issues currently. Seizure disorder:  No VTE: No  History of STI Yes, chlamydia Abnormal Pap smear:  No Genital herpes simplex:  No    PSH: Gynecologic Surgery: [facility] 05/2018 Surgical history reviewed.   Obstetric History: Obstetric history tab updated and reviewed.  Cesarean delivery: No  Gestational Diabetes:  No Hypertension in pregnancy: No Prior pregnancies: 3 prior therapeutic abortions, 1 missed AB 04/2018, 1 term SVD 2013 History of preterm birth: No Complications with prior pregnancies: None History of LGA/SGA infant:  No History of shoulder dystocia: No Indications for high risk referral were reviewed.  Social History: Partner's name: Monico Blitz  Tobacco use: Not currently, quit when she found out Alcohol use:  No Other substance use:  No  Current Medications:  Flovent, dulera daily. Albuterol prn. Betamethasone ointment.  Reviewed and appropriate in pregnancy.    Genetic and  Infection Screen: Flow Sheet Updated Yes  Prenatal Exam: Gen: obese female.  No distress.  Vitals noted. HEENT: Normocephalic, atraumatic.  Neck supple without cervical lymphadenopathy, thyromegaly or thyroid nodules.  CV: RRR no murmur, gallops or rubs Lungs: CTA B.  Normal respiratory effort without wheezes or rales. Abd: soft, NTND. +BS.  Uterus not appreciated above pelvis. GU: Normal external female genitalia without lesions.  Nl vaginal, well rugated without lesions. No vaginal discharge.  Bimanual exam: No adnexal mass or TTP. No CMT.  Ext: No clubbing, cyanosis or edema. Psych: Normal grooming and dress.  Not depressed or anxious appearing.  Normal thought content and process without flight of ideas or looseness of associations  Assessment/plan: 1) Pregnancy at [redacted]w[redacted]d by early [place] doing well.  Current pregnancy issues include maternal obesity. Prepregnancy weight updated and expected weight gain this pregnancy is 11-20 pounds  Prenatal labs reviewed, notable for Urine culture positive for staph hemolyticus, will give Keflex x7 days.  Indications for referral to [facility] were reviewed. Patient has h/o missed AB at 17wks but fetus was at 13wks by CRL, will reach out to [facility] attending for recommendations on referral.     Medication list reviewed and updated to include only medications current medications.  Prescribed vitamin B6 and doxylamine (Diclegis), with discussion of dose titration. Bleeding and pain precautions reviewed. Importance of prenatal vitamins reviewed.  Genetic screening offered, including first trimester screen with nuchal translucency at 11-13 weeks. Order placed. The patient is not age 107 or over at time of delivery. Referral to genetics was not offered today.  The patient has the following  risk factors for preexisting diabetes: BMI > 25 and high risk ethnicity (Latino, [place] American, Native American, [place], [place] American)  with first degree relative with  diabetes. A 1 hour glucose tolerance test  was ordered as appropriate. The patient will need repeat testing between 24-28 weeks.  PMH and PHQ9 forms completed.    Follow up 4 weeks.

## 2019-03-26 LAB — CYTOLOGY - PAP
Chlamydia: NEGATIVE
Comment: NEGATIVE
Comment: NEGATIVE
Comment: NORMAL
Diagnosis: NEGATIVE
Neisseria Gonorrhea: NEGATIVE
Trichomonas: NEGATIVE

## 2019-03-30 ENCOUNTER — Encounter (HOSPITAL_COMMUNITY): Payer: Self-pay | Admitting: Family Medicine

## 2019-03-30 ENCOUNTER — Other Ambulatory Visit: Payer: Self-pay

## 2019-03-30 ENCOUNTER — Inpatient Hospital Stay (HOSPITAL_COMMUNITY)
Admission: AD | Admit: 2019-03-30 | Discharge: 2019-03-30 | Disposition: A | Discharge status: Home / Self Care | Payer: Medicaid Other | Attending: Family Medicine | Admitting: Family Medicine

## 2019-03-30 DIAGNOSIS — O99511 Diseases of the respiratory system complicating pregnancy, first trimester: Secondary | ICD-10-CM | POA: Diagnosis not present

## 2019-03-30 DIAGNOSIS — Z3A08 8 weeks gestation of pregnancy: Secondary | ICD-10-CM | POA: Insufficient documentation

## 2019-03-30 DIAGNOSIS — Z3491 Encounter for supervision of normal pregnancy, unspecified, first trimester: Secondary | ICD-10-CM

## 2019-03-30 DIAGNOSIS — Z79899 Other long term (current) drug therapy: Secondary | ICD-10-CM | POA: Diagnosis not present

## 2019-03-30 DIAGNOSIS — Z6711 Type A blood, Rh negative: Secondary | ICD-10-CM

## 2019-03-30 DIAGNOSIS — O209 Hemorrhage in early pregnancy, unspecified: Secondary | ICD-10-CM | POA: Insufficient documentation

## 2019-03-30 DIAGNOSIS — Z7951 Long term (current) use of inhaled steroids: Secondary | ICD-10-CM | POA: Diagnosis not present

## 2019-03-30 DIAGNOSIS — N930 Postcoital and contact bleeding: Secondary | ICD-10-CM

## 2019-03-30 DIAGNOSIS — J45909 Unspecified asthma, uncomplicated: Secondary | ICD-10-CM | POA: Insufficient documentation

## 2019-03-30 DIAGNOSIS — Z87891 Personal history of nicotine dependence: Secondary | ICD-10-CM | POA: Insufficient documentation

## 2019-03-30 LAB — CBC WITH DIFFERENTIAL/PLATELET
Abs Immature Granulocytes: 0.03 10*3/uL (ref 0.00–0.07)
Basophils Absolute: 0 10*3/uL (ref 0.0–0.1)
Basophils Relative: 1 %
Eosinophils Absolute: 0.2 10*3/uL (ref 0.0–0.5)
Eosinophils Relative: 3 %
HCT: 37.1 % (ref 36.0–46.0)
Hemoglobin: 12.2 g/dL (ref 12.0–15.0)
Immature Granulocytes: 0 %
Lymphocytes Relative: 25 %
Lymphs Abs: 1.7 10*3/uL (ref 0.7–4.0)
MCH: 26.5 pg (ref 26.0–34.0)
MCHC: 32.9 g/dL (ref 30.0–36.0)
MCV: 80.7 fL (ref 80.0–100.0)
Monocytes Absolute: 0.5 10*3/uL (ref 0.1–1.0)
Monocytes Relative: 7 %
Neutro Abs: 4.4 10*3/uL (ref 1.7–7.7)
Neutrophils Relative %: 64 %
Platelets: 325 10*3/uL (ref 150–400)
RBC: 4.6 MIL/uL (ref 3.87–5.11)
RDW: 13.9 % (ref 11.5–15.5)
WBC: 6.8 10*3/uL (ref 4.0–10.5)
nRBC: 0 % (ref 0.0–0.2)

## 2019-03-30 LAB — COMPREHENSIVE METABOLIC PANEL
ALT: 14 U/L (ref 0–44)
AST: 12 U/L — ABNORMAL LOW (ref 15–41)
Albumin: 3.5 g/dL (ref 3.5–5.0)
Alkaline Phosphatase: 44 U/L (ref 38–126)
Anion gap: 11 (ref 5–15)
BUN: 8 mg/dL (ref 6–20)
CO2: 23 mmol/L (ref 22–32)
Calcium: 9.6 mg/dL (ref 8.9–10.3)
Chloride: 102 mmol/L (ref 98–111)
Creatinine, Ser: 0.78 mg/dL (ref 0.44–1.00)
GFR calc Af Amer: >60 mL/min (ref >60)
GFR calc non Af Amer: >60 mL/min (ref >60)
Glucose, Bld: 112 mg/dL — ABNORMAL HIGH (ref 70–99)
Potassium: 4.1 mmol/L (ref 3.5–5.1)
Sodium: 136 mmol/L (ref 135–145)
Total Bilirubin: 0.4 mg/dL (ref 0.3–1.2)
Total Protein: 7 g/dL (ref 6.5–8.1)

## 2019-03-30 LAB — URINALYSIS, ROUTINE W REFLEX MICROSCOPIC
Bacteria, UA: NONE SEEN
Bilirubin Urine: NEGATIVE
Glucose, UA: NEGATIVE mg/dL
Ketones, ur: NEGATIVE mg/dL
Leukocytes,Ua: NEGATIVE
Nitrite: NEGATIVE
Protein, ur: 30 mg/dL — AB
RBC / HPF: >50 RBC/hpf — ABNORMAL HIGH (ref 0–5)
Specific Gravity, Urine: 1.021 (ref 1.005–1.030)
pH: 6 (ref 5.0–8.0)

## 2019-03-30 LAB — HIV ANTIBODY (ROUTINE TESTING W REFLEX): HIV Screen 4th Generation wRfx: NONREACTIVE

## 2019-03-30 LAB — HCG, QUANTITATIVE, PREGNANCY: hCG, Beta Chain, Quant, S: 123846 m[IU]/mL — ABNORMAL HIGH (ref <5)

## 2019-03-30 MED ORDER — RHO D IMMUNE GLOBULIN 1500 UNIT/2ML IJ SOSY
300.0000 ug | PREFILLED_SYRINGE | Freq: Once | INTRAMUSCULAR | Status: AC
  Administered 2019-03-30: 300 ug via INTRAMUSCULAR
  Filled 2019-03-30: qty 2

## 2019-03-30 NOTE — Discharge Instructions (Signed)
Vaginal Bleeding During Pregnancy, First Trimester ° °A small amount of bleeding (spotting) from the vagina is common during early pregnancy. Sometimes the bleeding is normal and does not cause problems. At other times, though, bleeding may be a sign of something serious. Tell your doctor about any bleeding from your vagina right away. °Follow these instructions at home: °Activity °· Follow your doctor's instructions about how active you can be. °· If needed, make plans for someone to help with your normal activities. °· Do not have sex or orgasms until your doctor says that this is safe. °General instructions °· Take over-the-counter and prescription medicines only as told by your doctor. °· Watch your condition for any changes. °· Write down: °? The number of pads you use each day. °? How often you change pads. °? How soaked (saturated) your pads are. °· Do not use tampons. °· Do not douche. °· If you pass any tissue from your vagina, save it to show to your doctor. °· Keep all follow-up visits as told by your doctor. This is important. °Contact a doctor if: °· You have vaginal bleeding at any time while you are pregnant. °· You have cramps. °· You have a fever. °Get help right away if: °· You have very bad cramps in your back or belly (abdomen). °· You pass large clots or a lot of tissue from your vagina. °· Your bleeding gets worse. °· You feel light-headed. °· You feel weak. °· You pass out (faint). °· You have chills. °· You are leaking fluid from your vagina. °· You have a gush of fluid from your vagina. °Summary °· Sometimes vaginal bleeding during pregnancy is normal and does not cause problems. At other times, bleeding may be a sign of something serious. °· Tell your doctor about any bleeding from your vagina right away. °· Follow your doctor's instructions about how active you can be. You may need someone to help you with your normal activities. °This information is not intended to replace advice given to  you by your health care provider. Make sure you discuss any questions you have with your health care provider. °Document Released: 08/19/2013 Document Revised: 07/24/2018 Document Reviewed: 07/06/2016 °Elsevier Patient Education © 2020 Elsevier Inc. ° °

## 2019-03-30 NOTE — MAU Note (Signed)
Patient reports to MAU c/o vaginal bleeding that started around 1900. Pt reports a clot the size of a baseball. No abdominal pain associated with the bleeding. No other complaints.

## 2019-03-30 NOTE — MAU Provider Note (Signed)
History     CSN: [id number]  Arrival date and time: 03/30/19 [id number]   First Provider Initiated Contact with Patient 03/30/19 2117      Chief Complaint  Patient presents with  . Vaginal Bleeding   HPI   Gail Walker is a 28 y.o. female [id number] @ 3036w6d with a history of miscarriage here with vaginal bleeding after intercourse. The bleeding is bright red and she first noticed it today after sex. She denies pain. The bleeding is a small amount. She was seen in the office 2 weeks ago in the office for her initial prenatal and had STI testing. A negative blood type.   OB History    Gravida  6   Para  1   Term  1   Preterm  0   AB  4   Living  1     SAB  1   TAB  3   Ectopic  0   Multiple  0   Live Births  1           Past Medical History:  Diagnosis Date  . Acne   . Asthma    uses albuterol inhaler daily  . CAP (community acquired pneumonia) 11/14/2015  . Chlamydia 2012  . GERD (gastroesophageal reflux disease)    during pregnancy - takes zantac    Past Surgical History:  Procedure Laterality Date  . DILATION AND EVACUATION N/A 05/16/2018   Procedure: DILATATION AND EVACUATION;  Surgeon: [place] BingPickens, Charlie, MD;  Location: [facility];  Service: Gynecology;  Laterality: N/A;  . OPERATIVE ULTRASOUND N/A 05/16/2018   Procedure: OPERATIVE ULTRASOUND;  Surgeon: [place] BingPickens, Charlie, MD;  Location: [facility];  Service: Gynecology;  Laterality: N/A;  . pregnancy termination      Family History  Problem Relation Age of Onset  . Diabetes Father   . Cancer Maternal Aunt   . Anesthesia problems Neg Hx   . Hearing loss Neg Hx   . Stroke Neg Hx   . CAD Neg Hx     Social History   Tobacco Use  . Smoking status: Former Smoker    Packs/day: 0.25    Years: 3.00    Pack years: 0.75    Types: Cigarettes    Quit date: 03/06/2019    Years since quitting: 0.0  . Smokeless tobacco: Never Used  Substance Use Topics  . Alcohol use: No   . Drug use: No    Allergies: No Known Allergies  Medications Prior to Admission  Medication Sig Dispense Refill Last Dose  . albuterol (PROVENTIL) (2.5 MG/3ML) 0.083% nebulizer solution Take 6 mLs (5 mg total) by nebulization every 4 (four) hours as needed for wheezing or shortness of breath. 75 mL 1 03/30/2019 at Unknown time  . albuterol (VENTOLIN HFA) 108 (90 Base) MCG/ACT inhaler Inhale 2 puffs into the lungs every 4 (four) hours as needed for wheezing or shortness of breath. 6.7 g 1 03/30/2019 at Unknown time  . betamethasone valerate ointment (VALISONE) 0.1 % Apply topically 2 (two) times daily. 45 g 1 03/29/2019 at Unknown time  . Doxylamine-Pyridoxine 10-10 MG TBEC Take two tablets at bedtime on day 1 and 2; if symptoms persist, take 1 tablet in morning and 2 tablets at bedtime on day 3; if symptoms persist, may increase to 1 tablet in morning, 1 tablet mid-afternoon, and 2 tablets at bedtime on day 4 60 tablet 1   . fluticasone (FLOVENT HFA) 110 MCG/ACT inhaler Inhale 1 puff  into the lungs 2 (two) times daily. 1 Inhaler 12   . mometasone-formoterol (DULERA) 100-5 MCG/ACT AERO Inhale 2 puffs into the lungs 2 (two) times daily. 13 g 1   . Prenatal Vit-Fe Fumarate-FA (PRENATAL VITAMIN) 27-0.8 MG TABS Take 1 tablet by mouth daily. 90 tablet 3     Results for orders placed or performed during the hospital encounter of 03/30/19 (from the past 48 hour(s))  Urinalysis, Routine w reflex microscopic     Status: Abnormal   Collection Time: 03/30/19  7:35 PM  Result Value Ref Range   Color, Urine YELLOW YELLOW   APPearance HAZY (A) CLEAR   Specific Gravity, Urine 1.021 1.005 - 1.030   pH 6.0 5.0 - 8.0   Glucose, UA NEGATIVE NEGATIVE mg/dL   Hgb urine dipstick LARGE (A) NEGATIVE   Bilirubin Urine NEGATIVE NEGATIVE   Ketones, ur NEGATIVE NEGATIVE mg/dL   Protein, ur 30 (A) NEGATIVE mg/dL   Nitrite NEGATIVE NEGATIVE   Leukocytes,Ua NEGATIVE NEGATIVE   RBC / HPF >50 (H) 0 - 5 RBC/hpf    WBC, UA 0-5 0 - 5 WBC/hpf   Bacteria, UA NONE SEEN NONE SEEN   Squamous Epithelial / LPF 6-10 0 - 5   Mucus PRESENT     Comment: Performed at [facility], [address], [place], [place] [address]  CBC with Differential/Platelet     Status: None   Collection Time: 03/30/19  8:47 PM  Result Value Ref Range   WBC 6.8 4.0 - 10.5 K/uL   RBC 4.60 3.87 - 5.11 MIL/uL   Hemoglobin 12.2 12.0 - 15.0 g/dL   HCT 37.1 36.0 - 46.0 %   MCV 80.7 80.0 - 100.0 fL   MCH 26.5 26.0 - 34.0 pg   MCHC 32.9 30.0 - 36.0 g/dL   RDW 13.9 11.5 - 15.5 %   Platelets 325 150 - 400 K/uL   nRBC 0.0 0.0 - 0.2 %   Neutrophils Relative % 64 %   Neutro Abs 4.4 1.7 - 7.7 K/uL   Lymphocytes Relative 25 %   Lymphs Abs 1.7 0.7 - 4.0 K/uL   Monocytes Relative 7 %   Monocytes Absolute 0.5 0.1 - 1.0 K/uL   Eosinophils Relative 3 %   Eosinophils Absolute 0.2 0.0 - 0.5 K/uL   Basophils Relative 1 %   Basophils Absolute 0.0 0.0 - 0.1 K/uL   Immature Granulocytes 0 %   Abs Immature Granulocytes 0.03 0.00 - 0.07 K/uL    Comment: Performed at [facility], [address], [place], [place] [address]  Comprehensive metabolic panel     Status: Abnormal   Collection Time: 03/30/19  8:47 PM  Result Value Ref Range   Sodium 136 135 - 145 mmol/L   Potassium 4.1 3.5 - 5.1 mmol/L   Chloride 102 98 - 111 mmol/L   CO2 23 22 - 32 mmol/L   Glucose, Bld 112 (H) 70 - 99 mg/dL   BUN 8 6 - 20 mg/dL   Creatinine, Ser 0.78 0.44 - 1.00 mg/dL   Calcium 9.6 8.9 - 10.3 mg/dL   Total Protein 7.0 6.5 - 8.1 g/dL   Albumin 3.5 3.5 - 5.0 g/dL   AST 12 (L) 15 - 41 U/L   ALT 14 0 - 44 U/L   Alkaline Phosphatase 44 38 - 126 U/L   Total Bilirubin 0.4 0.3 - 1.2 mg/dL   GFR calc non Af Amer >60 >60 mL/min   GFR calc  Af Amer >60 >60 mL/min   Anion gap 11 5 - 15    Comment: Performed at [facility], [address] [place], [place] [address]  hCG, quantitative, pregnancy     Status: Abnormal   Collection Time: 03/30/19  8:47 PM   Result Value Ref Range   hCG, Beta Chain, Quant, S 123,846 (H) <5 mIU/mL    Comment:          GEST. AGE      CONC.  (mIU/mL)   <=1 WEEK        5 - 50     2 WEEKS       50 - 500     3 WEEKS       100 - 10,000     4 WEEKS     1,000 - 30,000     5 WEEKS     3,500 - 115,000   6-8 WEEKS     12,000 - 270,000    12 WEEKS     15,000 - 220,000        FEMALE AND NON-PREGNANT FEMALE:     LESS THAN 5 mIU/mL Performed at [facility], [address] [place], [place] [address]   Rh IG workup (includes ABO/Rh)     Status: None (Preliminary result)   Collection Time: 03/30/19  8:47 PM  Result Value Ref Range   Gestational Age(Wks) 8    ABO/RH(D) A NEG    Antibody Screen NEG    Unit Number [id number]    Blood Component Type RHIG    Unit division 00    Status of Unit ISSUED    Transfusion Status      OK TO TRANSFUSE Performed at [facility], [address], [place], [place] [address]     Review of Systems  Constitutional: Negative for fever.  Gastrointestinal: Negative for abdominal pain.  Genitourinary: Positive for vaginal bleeding. Negative for vaginal discharge.   Physical Exam   Blood pressure 139/80, pulse (!) 104, temperature 98.2 F (36.8 C), temperature source Oral, resp. rate 19, weight 118.5 kg, last menstrual period 01/09/2019, unknown if currently breastfeeding.  Physical Exam  Constitutional: She is oriented to person, place, and time. She appears well-developed and well-nourished. No distress.  Respiratory: Effort normal.  GI: Soft. She exhibits no distension and no mass. There is no abdominal tenderness. There is no rebound and no guarding.  Genitourinary:    Genitourinary Comments: Vagina - Small amount of pink vaginal discharge, no odor  Cervix - scant active bleeding from os. No clots. Appears closed  Bimanual exam: deferred  Chaperone present for exam.    Musculoskeletal:        General: Normal range of motion.  Neurological: She is alert and  oriented to person, place, and time.  Skin: Skin is warm. She is not diaphoretic.  Psychiatric: Her behavior is normal.    MAU Course  Procedures   Pt informed that the ultrasound is considered a limited OB ultrasound and is not intended to be a complete ultrasound exam.  Patient also informed that the ultrasound is not being completed with the intent of assessing for fetal or placental anomalies or any pelvic abnormalities.  Explained that the purpose of today's ultrasound is to assess for  viability.  Patient acknowledges the purpose of the exam and the limitations of the study.  + fetal heart tones with active fetus noted on [place].    MDM  A  negative blood type: Rhogam given Patient declined STI workup d/t having workup 1 week ago.   Assessment and Plan   A:  1. PCB (post coital bleeding)   2. Vaginal bleeding in pregnancy, first trimester   3. Type A blood, Rh negative   4. Presence of fetal heart sounds in first trimester     P:  Discharge home in stable condition Rhogam given today Pelvic rest encouraged Return to MAU if symptoms/ bleeding worsen   Wes Lezotte, Harolyn Rutherford, NP 03/30/2019 10:29 PM

## 2019-03-31 LAB — RH IG WORKUP (INCLUDES ABO/RH)
ABO/RH(D): A NEG
Antibody Screen: NEGATIVE
Gestational Age(Wks): 8
Unit division: 0

## 2019-04-09 ENCOUNTER — Ambulatory Visit (HOSPITAL_COMMUNITY): Payer: Medicaid Other

## 2019-04-19 NOTE — L&D Delivery Note (Addendum)
Patient: Gail Walker MRN: [id number]  GBS status: Negative  Patient is a 29 y.o. now [id number] s/p NSVD at [redacted]w[redacted]d, who was admitted for early labor, postdates, and elevated BP. AROM 4h 29m prior to delivery with clear fluid.   Patient was admitted due to early labor at [redacted]w[redacted]d and elevated BP. Pre-E labs were negative and patient remained asymptomatic; did not meet criteria for gHTN at time of delivery as elevated BP's did not recur. Her initial SVE was 3/70/-3. She was expectantly managed due to painful contractions every 3-4 minutes on admission. She was later augmented with buccal Cytotec due Bishop score of 5 and contractions spacing to every 6 minutes. Patient progressed to 9.5/100/0 at which time AROM was performed at 1548 with clear fluid. She ultimately progressed to complete after Pitocin initiated. Elevated temperature just prior to delivery.   Delivery Note After progressing to complete, patient began pushing. Head delivered LOA. Shoulder and body delivered in usual fashion. Loose body cord was present and immediately reduced. Infant with spontaneous cry, placed on mother's abdomen, dried and bulb suctioned. Cord clamped x 2 after 1-minute delay, and cut by family member. Cord blood drawn. Placenta delivered spontaneously with gentle cord traction. Fundus firm with massage and Pitocin. Perineum inspected and found to be intact.  At 8:09 PM a viable female was delivered via Vaginal, Spontaneous (Presentation: Left Occiput Anterior).  APGAR: 9, 9; weight per medical record.   Placenta status: Spontaneous, Intact.  Cord: 3 vessels with the following complications: None.  Anesthesia: Epidural Episiotomy: None Lacerations: None Est. Blood Loss (mL): 180  Mom to postpartum.  Baby to Couplet care / Skin to Skin.  Worthy Rancher, MD 11/04/2019, 8:23 PM  OB FELLOW DELIVERY ATTESTATION  I was gloved and present for the delivery in its entirety, and I agree with the above resident's note.     Jerilynn Birkenhead, MD [facility] Family Medicine Fellow, [facility] for [employer], [facility]

## 2019-04-21 ENCOUNTER — Other Ambulatory Visit: Payer: Self-pay | Admitting: Family Medicine

## 2019-04-25 ENCOUNTER — Other Ambulatory Visit (HOSPITAL_COMMUNITY): Payer: Self-pay | Admitting: *Deleted

## 2019-04-25 ENCOUNTER — Ambulatory Visit (HOSPITAL_COMMUNITY): Payer: Medicaid Other | Admitting: *Deleted

## 2019-04-25 ENCOUNTER — Other Ambulatory Visit: Payer: Self-pay

## 2019-04-25 ENCOUNTER — Encounter (HOSPITAL_COMMUNITY): Payer: Self-pay

## 2019-04-25 ENCOUNTER — Ambulatory Visit (HOSPITAL_COMMUNITY)
Admission: RE | Admit: 2019-04-25 | Discharge: 2019-04-25 | Disposition: A | Discharge status: Home / Self Care | Payer: Medicaid Other | Source: Ambulatory Visit | Attending: Obstetrics and Gynecology | Admitting: Obstetrics and Gynecology

## 2019-04-25 ENCOUNTER — Ambulatory Visit (HOSPITAL_COMMUNITY): Payer: Medicaid Other

## 2019-04-25 VITALS — BP 127/85 | HR 114 | Temp 97.7°F | Wt 261.8 lb

## 2019-04-25 DIAGNOSIS — Z3682 Encounter for antenatal screening for nuchal translucency: Secondary | ICD-10-CM | POA: Diagnosis present

## 2019-04-25 DIAGNOSIS — O09291 Supervision of pregnancy with other poor reproductive or obstetric history, first trimester: Secondary | ICD-10-CM

## 2019-04-25 DIAGNOSIS — O360110 Maternal care for anti-D [Rh] antibodies, first trimester, not applicable or unspecified: Secondary | ICD-10-CM | POA: Diagnosis not present

## 2019-04-25 DIAGNOSIS — O99211 Obesity complicating pregnancy, first trimester: Secondary | ICD-10-CM | POA: Diagnosis not present

## 2019-04-25 DIAGNOSIS — Z3A12 12 weeks gestation of pregnancy: Secondary | ICD-10-CM

## 2019-04-25 DIAGNOSIS — Z8759 Personal history of other complications of pregnancy, childbirth and the puerperium: Secondary | ICD-10-CM

## 2019-04-25 DIAGNOSIS — Z3A01 Less than 8 weeks gestation of pregnancy: Secondary | ICD-10-CM

## 2019-04-27 LAB — FIRST TRIMESTER SCREEN W/NT
CRL: 70.7 mm
DIA MoM: 0.76
DIA Value: 117.7 pg/mL
Gest Age-Collect: 13 weeks
Maternal Age At EDD: 28.4 yr
Nuchal Translucency MoM: 0.88
Nuchal Translucency: 1.7 mm
Number of Fetuses: 1
PAPP-A MoM: 3.18
PAPP-A Value: 1754.1 ng/mL
Test Results:: NEGATIVE
Weight: 261 [lb_av]
hCG MoM: 1.02
hCG Value: 63 IU/mL

## 2019-04-29 ENCOUNTER — Telehealth (HOSPITAL_COMMUNITY): Payer: Self-pay | Admitting: Genetic Counselor

## 2019-04-29 NOTE — Telephone Encounter (Signed)
I called Ms. Gille to discuss her negative first trimester screen results. We reviewed that the risk for her pregnancy to be affected by Down syndrome decreased from her 1 in 802 age-related risk to less than 1 in 10,000, and the risk for trisomy 18 decreased from her 1 in 1570 age-related risk to less than 1 in 10,000 based on the results of this screen. Ms. Kugelman inquired if this screen can determine fetal gender, which it cannot. First trimester screening compares levels of hormones made by the body during pregnancy to determine if patterns are consistent with an increased risk for Down syndrome or trisomy 18 only, whereas other aneuploidy screening options such as noninvasive prenatal screening (NIPS) measuring placental/fetal cell-free DNA to determine the risk for additional chromosomal aneuploidies and expected fetal sex. Fetal sex will be determined at Ms. Finstad's anatomy ultrasound in February.   Ms. Ruble was reminded that while her negative first trimester screening result significantly reduces the likelihood of the pregnancy being affected by trisomy 94 or trisomy 82, it cannot be considered diagnostic. Diagnostic testing via CV[facility] or amniocentesis is available should she be interested in pursuing this. Additionally, first trimester screening does not screen for open neural tube defects such as spina bifida. It is recommended that MS-AFP screening be ordered around 16-18 weeks to screen for this. Ms. Kaczmarek confirmed that she had no further questions about these results.  Gershon Crane, MS Genetic Counselor

## 2019-05-04 ENCOUNTER — Inpatient Hospital Stay (HOSPITAL_COMMUNITY)
Admission: AD | Admit: 2019-05-04 | Discharge: 2019-05-04 | Disposition: A | Discharge status: Home / Self Care | Payer: Medicaid Other | Attending: Obstetrics and Gynecology | Admitting: Obstetrics and Gynecology

## 2019-05-04 ENCOUNTER — Encounter (HOSPITAL_COMMUNITY): Payer: Self-pay | Admitting: Obstetrics and Gynecology

## 2019-05-04 ENCOUNTER — Other Ambulatory Visit: Payer: Self-pay

## 2019-05-04 DIAGNOSIS — O26899 Other specified pregnancy related conditions, unspecified trimester: Secondary | ICD-10-CM

## 2019-05-04 DIAGNOSIS — R1030 Lower abdominal pain, unspecified: Secondary | ICD-10-CM | POA: Diagnosis not present

## 2019-05-04 DIAGNOSIS — R102 Pelvic and perineal pain: Secondary | ICD-10-CM | POA: Diagnosis not present

## 2019-05-04 DIAGNOSIS — O26892 Other specified pregnancy related conditions, second trimester: Secondary | ICD-10-CM | POA: Diagnosis not present

## 2019-05-04 DIAGNOSIS — K219 Gastro-esophageal reflux disease without esophagitis: Secondary | ICD-10-CM | POA: Insufficient documentation

## 2019-05-04 DIAGNOSIS — Z3A13 13 weeks gestation of pregnancy: Secondary | ICD-10-CM

## 2019-05-04 DIAGNOSIS — Z87891 Personal history of nicotine dependence: Secondary | ICD-10-CM | POA: Insufficient documentation

## 2019-05-04 DIAGNOSIS — Z79899 Other long term (current) drug therapy: Secondary | ICD-10-CM | POA: Diagnosis not present

## 2019-05-04 DIAGNOSIS — O99612 Diseases of the digestive system complicating pregnancy, second trimester: Secondary | ICD-10-CM | POA: Diagnosis not present

## 2019-05-04 DIAGNOSIS — O99512 Diseases of the respiratory system complicating pregnancy, second trimester: Secondary | ICD-10-CM | POA: Insufficient documentation

## 2019-05-04 DIAGNOSIS — J45909 Unspecified asthma, uncomplicated: Secondary | ICD-10-CM | POA: Diagnosis not present

## 2019-05-04 MED ORDER — ACETAMINOPHEN 500 MG PO TABS
1000.0000 mg | ORAL_TABLET | Freq: Once | ORAL | Status: AC
  Administered 2019-05-04: 1000 mg via ORAL
  Filled 2019-05-04: qty 2

## 2019-05-04 MED ORDER — COMFORT FIT MATERNITY SUPP LG MISC: 1.0000 [IU] | Freq: Every day | 0 refills | Status: DC

## 2019-05-04 NOTE — MAU Provider Note (Signed)
22 History     CSN: [id number]  Arrival date and time: 05/04/19 2117   First Provider Initiated Contact with Patient 05/04/19 2216      Chief Complaint  Patient presents with  . Pelvic Pain   Gail Walker is a 30 y.o. G6P1 at [redacted]w[redacted]d who presents to MAU with complaints of abdominal pain. She reports lower abdominal pain started occurring yesterday. Describes the pain as lower abdominal cramping that get worse when standing and walking. Rates pain 7/10 - has not taken any medication for pain. She denies vaginal bleeding, vaginal discharge, recent IC or urinary symptoms.    OB History    Gravida  6   Para  1   Term  1   Preterm  0   AB  4   Living  1     SAB  1   TAB  3   Ectopic  0   Multiple  0   Live Births  1           Past Medical History:  Diagnosis Date  . Acne   . Asthma    uses albuterol inhaler daily  . CAP (community acquired pneumonia) 11/14/2015  . Chlamydia 2012  . GERD (gastroesophageal reflux disease)    during pregnancy - takes zantac    Past Surgical History:  Procedure Laterality Date  . DILATION AND EVACUATION N/A 05/16/2018   Procedure: DILATATION AND EVACUATION;  Surgeon: [place] Bing, MD;  Location: [facility];  Service: Gynecology;  Laterality: N/A;  . OPERATIVE ULTRASOUND N/A 05/16/2018   Procedure: OPERATIVE ULTRASOUND;  Surgeon: Maria Antonia Bing, MD;  Location: [facility];  Service: Gynecology;  Laterality: N/A;  . pregnancy termination      Family History  Problem Relation Age of Onset  . Diabetes Father   . Cancer Maternal Aunt   . Anesthesia problems Neg Hx   . Hearing loss Neg Hx   . Stroke Neg Hx   . CAD Neg Hx     Social History   Tobacco Use  . Smoking status: Former Smoker    Packs/day: 0.25    Years: 3.00    Pack years: 0.75    Types: Cigarettes    Quit date: 03/06/2019    Years since quitting: 0.1  . Smokeless tobacco: Never Used  Substance Use Topics  . Alcohol  use: No  . Drug use: No    Allergies: No Known Allergies  Medications Prior to Admission  Medication Sig Dispense Refill Last Dose  . VENTOLIN HFA 108 (90 Base) MCG/ACT inhaler INHALE 2 PUFFS INTO THE LUNGS EVERY 4 (FOUR) HOURS AS NEEDED FOR WHEEZING OR SHORTNESS OF BREATH. (Patient not taking: Reported on 04/25/2019) 8 g 2   . albuterol (PROVENTIL) (2.5 MG/3ML) 0.083% nebulizer solution Take 6 mLs (5 mg total) by nebulization every 4 (four) hours as needed for wheezing or shortness of breath. 75 mL 1   . betamethasone valerate ointment (VALISONE) 0.1 % Apply topically 2 (two) times daily. 45 g 1   . Cetirizine HCl (ZYRTEC PO) Take by mouth.     . Doxylamine-Pyridoxine 10-10 MG TBEC Take two tablets at bedtime on day 1 and 2; if symptoms persist, take 1 tablet in morning and 2 tablets at bedtime on day 3; if symptoms persist, may increase to 1 tablet in morning, 1 tablet mid-afternoon, and 2 tablets at bedtime on day 4 (Patient not taking: Reported on 04/25/2019) 60 tablet 1   . fluticasone (  FLOVENT HFA) 110 MCG/ACT inhaler Inhale 1 puff into the lungs 2 (two) times daily. (Patient not taking: Reported on 04/25/2019) 1 Inhaler 12   . mometasone-formoterol (DULERA) 100-5 MCG/ACT AERO Inhale 2 puffs into the lungs 2 (two) times daily. 13 g 1   . Prenatal Vit-Fe Fumarate-FA (PRENATAL VITAMIN) 27-0.8 MG TABS Take 1 tablet by mouth daily. (Patient not taking: Reported on 04/25/2019) 90 tablet 3     Review of Systems  Constitutional: Negative.   Respiratory: Negative.   Cardiovascular: Negative.   Gastrointestinal: Positive for abdominal pain. Negative for constipation, diarrhea, nausea and vomiting.  Genitourinary: Negative.   Musculoskeletal: Negative.   Neurological: Negative.   Psychiatric/Behavioral: Negative.    Physical Exam   Blood pressure 123/74, pulse (!) 117, temperature 98.1 F (36.7 C), temperature source Oral, resp. rate 18, height 5\' 4"  (1.626 m), weight 121.1 kg, last menstrual  period 01/09/2019, SpO2 99 %, unknown if currently breastfeeding.  Physical Exam  Nursing note and vitals reviewed. Constitutional: She is oriented to person, place, and time. She appears well-developed and well-nourished. No distress.  Cardiovascular: Normal rate, regular rhythm and normal heart sounds.  Respiratory: Effort normal and breath sounds normal. No respiratory distress. She has no wheezes.  GI: Soft. She exhibits no distension. There is no abdominal tenderness. There is no rebound and no guarding.  Musculoskeletal:        General: No edema. Normal range of motion.  Neurological: She is alert and oriented to person, place, and time.  Psychiatric: She has a normal mood and affect. Her behavior is normal. Thought content normal.   Dilation: Closed Effacement (%): Thick Cervical Position: Posterior Exam by:: Wende Bushy CNM   RN reports unable to obtain FHR by doppler - bedside US performed   Pt informed that the ultrasound is considered a limited OB ultrasound and is not intended to be a complete ultrasound exam.  Patient also informed that the ultrasound is not being completed with the intent of assessing for fetal or placental anomalies or any pelvic abnormalities.  Explained that the purpose of today's ultrasound is to assess for  viability.  Patient acknowledges the purpose of the exam and the limitations of the study.    FHR 166 by bedside US   MAU Course  Procedures  MDM Cervical examination - closed /thick/ posterior  Bedside US  Tylenol 1,000mg  ordered and given  Reassessment - patient reports pain is resolved   Educated and discussed RLP with patient, warning signs and reasons to return to MAU. Rx for maternity support belt given to patient. Encouraged to make appointment to initiate prenatal care. Pt stable at time of discharge.   Assessment and Plan   1. Pain of round ligament during pregnancy   2. [redacted] weeks gestation of pregnancy    Discharge home Make  appointment to initiate prenatal care Return to MAU as needed for reasons discussed and/or emergencies  Rx for maternity support belt   Allergies as of 05/04/2019   No Known Allergies     Medication List    TAKE these medications   albuterol (2.5 MG/3ML) 0.083% nebulizer solution Commonly known as: PROVENTIL Take 6 mLs (5 mg total) by nebulization every 4 (four) hours as needed for wheezing or shortness of breath.   Ventolin HFA 108 (90 Base) MCG/ACT inhaler Generic drug: albuterol INHALE 2 PUFFS INTO THE LUNGS EVERY 4 (FOUR) HOURS AS NEEDED FOR WHEEZING OR SHORTNESS OF BREATH.   betamethasone valerate ointment 0.1 % Commonly  known as: VALISONE Apply topically 2 (two) times daily.   Comfort Fit Maternity Supp Lg Misc 1 Units by Does not apply route daily.   Doxylamine-Pyridoxine 10-10 MG Tbec Take two tablets at bedtime on day 1 and 2; if symptoms persist, take 1 tablet in morning and 2 tablets at bedtime on day 3; if symptoms persist, may increase to 1 tablet in morning, 1 tablet mid-afternoon, and 2 tablets at bedtime on day 4   Dulera 100-5 MCG/ACT Aero Generic drug: mometasone-formoterol Inhale 2 puffs into the lungs 2 (two) times daily.   fluticasone 110 MCG/ACT inhaler Commonly known as: FLOVENT HFA Inhale 1 puff into the lungs 2 (two) times daily.   Prenatal Vitamin 27-0.8 MG Tabs Take 1 tablet by mouth daily.   ZYRTEC PO Take by mouth.       Sharyon Cable CNM 05/04/2019, 11:15 PM

## 2019-05-04 NOTE — Discharge Instructions (Signed)

## 2019-05-04 NOTE — MAU Note (Signed)
Pt here with intermittent lower abdominal cramping that started yesterday. Rates 7/10. Has not tried anything for pain. No urinary s/s. Has had constipation. Last BM was 2 days ago. Denies vaginal bleeding or discharge.

## 2019-05-16 ENCOUNTER — Other Ambulatory Visit: Payer: Self-pay

## 2019-05-16 ENCOUNTER — Telehealth (INDEPENDENT_AMBULATORY_CARE_PROVIDER_SITE_OTHER): Payer: Medicaid Other | Admitting: Family Medicine

## 2019-05-16 ENCOUNTER — Telehealth: Payer: Self-pay | Admitting: Family Medicine

## 2019-05-16 DIAGNOSIS — O0992 Supervision of high risk pregnancy, unspecified, second trimester: Secondary | ICD-10-CM

## 2019-05-16 DIAGNOSIS — J4540 Moderate persistent asthma, uncomplicated: Secondary | ICD-10-CM

## 2019-05-16 NOTE — Telephone Encounter (Signed)
Pt missed her virtual appointment she did not hear the phone ring would like a call back. jw

## 2019-05-16 NOTE — Progress Notes (Signed)
Called patient who did not pick up, left hippa compliant VM to call back so that we could connect and to go to the ED if needed.  Given her history of uncontrolled asthma, I am also placing referral to OB/GYN for management of this second to rest her pregnancy is high risk.  -Dr. Parke Simmers

## 2019-05-17 ENCOUNTER — Other Ambulatory Visit: Payer: Self-pay

## 2019-05-17 ENCOUNTER — Inpatient Hospital Stay (HOSPITAL_COMMUNITY): Payer: Medicaid Other

## 2019-05-17 ENCOUNTER — Telehealth (INDEPENDENT_AMBULATORY_CARE_PROVIDER_SITE_OTHER): Payer: Medicaid Other | Admitting: Family Medicine

## 2019-05-17 ENCOUNTER — Inpatient Hospital Stay (HOSPITAL_COMMUNITY)
Admission: AD | Admit: 2019-05-17 | Discharge: 2019-05-17 | Disposition: A | Discharge status: Home / Self Care | Payer: Medicaid Other | Attending: Family Medicine | Admitting: Family Medicine

## 2019-05-17 ENCOUNTER — Encounter (HOSPITAL_COMMUNITY): Payer: Self-pay | Admitting: Family Medicine

## 2019-05-17 DIAGNOSIS — Z3A15 15 weeks gestation of pregnancy: Secondary | ICD-10-CM | POA: Diagnosis not present

## 2019-05-17 DIAGNOSIS — J4541 Moderate persistent asthma with (acute) exacerbation: Secondary | ICD-10-CM | POA: Diagnosis not present

## 2019-05-17 DIAGNOSIS — J4540 Moderate persistent asthma, uncomplicated: Secondary | ICD-10-CM

## 2019-05-17 DIAGNOSIS — O0992 Supervision of high risk pregnancy, unspecified, second trimester: Secondary | ICD-10-CM

## 2019-05-17 DIAGNOSIS — O99512 Diseases of the respiratory system complicating pregnancy, second trimester: Secondary | ICD-10-CM | POA: Insufficient documentation

## 2019-05-17 DIAGNOSIS — J45909 Unspecified asthma, uncomplicated: Secondary | ICD-10-CM

## 2019-05-17 DIAGNOSIS — Z20822 Contact with and (suspected) exposure to covid-19: Secondary | ICD-10-CM | POA: Insufficient documentation

## 2019-05-17 DIAGNOSIS — R062 Wheezing: Secondary | ICD-10-CM

## 2019-05-17 DIAGNOSIS — Z87891 Personal history of nicotine dependence: Secondary | ICD-10-CM | POA: Insufficient documentation

## 2019-05-17 DIAGNOSIS — R0602 Shortness of breath: Secondary | ICD-10-CM | POA: Diagnosis not present

## 2019-05-17 LAB — RESPIRATORY PANEL BY RT PCR (FLU A&B, COVID)
Influenza A by PCR: NEGATIVE
Influenza B by PCR: NEGATIVE
SARS Coronavirus 2 by RT PCR: NEGATIVE

## 2019-05-17 MED ORDER — DIPHENHYDRAMINE HCL 25 MG PO CAPS
25.0000 mg | ORAL_CAPSULE | Freq: Once | ORAL | Status: AC
  Administered 2019-05-17: 25 mg via ORAL
  Filled 2019-05-17: qty 1

## 2019-05-17 MED ORDER — DIPHENHYDRAMINE HCL 25 MG PO TABS: 25.0000 mg | ORAL_TABLET | Freq: Every evening | ORAL | 1 refills | Status: DC | PRN

## 2019-05-17 MED ORDER — PRENATAL VITAMIN 27-0.8 MG PO TABS: 1.0000 | ORAL_TABLET | Freq: Every day | ORAL | 3 refills | Status: DC

## 2019-05-17 MED ORDER — AEROCHAMBER PLUS FLO-VU MEDIUM MISC
1.0000 | Freq: Once | Status: DC
  Filled 2019-05-17: qty 1

## 2019-05-17 MED ORDER — ALBUTEROL SULFATE HFA 108 (90 BASE) MCG/ACT IN AERS
2.0000 | INHALATION_SPRAY | Freq: Once | RESPIRATORY_TRACT | Status: AC
  Administered 2019-05-17: 20:00:00 2 via RESPIRATORY_TRACT
  Filled 2019-05-17: qty 6.7

## 2019-05-17 MED ORDER — PREDNISONE 50 MG PO TABS: ORAL_TABLET | ORAL | 0 refills | Status: DC

## 2019-05-17 MED ORDER — PREDNISONE 50 MG PO TABS
60.0000 mg | ORAL_TABLET | Freq: Once | ORAL | Status: AC
  Administered 2019-05-17: 60 mg via ORAL
  Filled 2019-05-17: qty 1

## 2019-05-17 MED ORDER — PREDNISONE 20 MG PO TABS: 60.0000 mg | ORAL_TABLET | Freq: Every day | ORAL | 0 refills | Status: DC

## 2019-05-17 MED ORDER — MONTELUKAST SODIUM 10 MG PO TABS: 10.0000 mg | ORAL_TABLET | Freq: Every day | ORAL | 2 refills | Status: DC

## 2019-05-17 MED ORDER — AEROCHAMBER PLUS FLO-VU LARGE MISC
1.0000 | Freq: Once | Status: DC
  Filled 2019-05-17: qty 1

## 2019-05-17 MED ORDER — DULERA 100-5 MCG/ACT IN AERO: 2.0000 | INHALATION_SPRAY | Freq: Two times a day (BID) | RESPIRATORY_TRACT | 1 refills | Status: DC

## 2019-05-17 NOTE — Assessment & Plan Note (Signed)
Able to connect with patient on phone, discussed referal to [facility] for pregnancy management due to uncontrolled asthma.  Patient understands and will [facility] to schedule appt asap.  Re-ordered prenatal because she was not taking one

## 2019-05-17 NOTE — MAU Note (Signed)
.   Gail Walker is a 29 y.o. at [redacted]w[redacted]d here in MAU reporting: that she has been having difficulty breathing over the last couple of days and has had to use her inhalers more than often. Denies any pregnancy complications LMP: 01/09/19 Onset of complaint: ongoing 3 days Pain score: 0 Vitals:   05/17/19 1810 05/17/19 1811  BP:  120/76  Pulse:  (!) 113  Resp:  18  Temp:  (!) 97.5 F (36.4 C)  SpO2: 99%      FHT:154 Lab orders placed from triage:

## 2019-05-17 NOTE — MAU Provider Note (Signed)
History     CSN: [id number]  Arrival date and time: 05/17/19 1753   First Provider Initiated Contact with Patient 05/17/19 1850      Chief Complaint  Patient presents with  . Respiratory Distress   HPI   [name] is a 29 y.o. female [phone] @ [redacted]w[redacted]d here in MAU with SOB associated with her asthma. States she has had trouble with her asthma since she was a child. States her allergies have "acted" up in the last 2 days and she has used a whole albuterol inhaler in 2 days. When she uses her albuterol she has relief. She is also Rx'd [facility] which she  Feels does not work as well, she also has a nebulizer treatment. She did a telemed visit today and got refills on all of her medications. She is not taking antihistamines regularly. Denies covid symptoms. Has not had covid exposure.   She is receiving her care at [facility].   OB History    Gravida  6   Para  1   Term  1   Preterm  0   AB  4   Living  1     SAB  1   TAB  3   Ectopic  0   Multiple  0   Live Births  1           Past Medical History:  Diagnosis Date  . Acne   . Asthma    uses albuterol inhaler daily  . CAP (community acquired pneumonia) 11/14/2015  . Chlamydia 2012  . GERD (gastroesophageal reflux disease)    during pregnancy - takes zantac    Past Surgical History:  Procedure Laterality Date  . DILATION AND EVACUATION N/A 05/16/2018   Procedure: DILATATION AND EVACUATION;  Surgeon: [place] Bing, MD;  Location: [facility];  Service: Gynecology;  Laterality: N/A;  . OPERATIVE ULTRASOUND N/A 05/16/2018   Procedure: OPERATIVE ULTRASOUND;  Surgeon: [place] Bing, MD;  Location: [facility];  Service: Gynecology;  Laterality: N/A;  . pregnancy termination      Family History  Problem Relation Age of Onset  . Diabetes Father   . Cancer Maternal Aunt   . Anesthesia problems Neg Hx   . Hearing loss Neg Hx   . Stroke Neg Hx   . CAD Neg Hx      Social History   Tobacco Use  . Smoking status: Former Smoker    Packs/day: 0.25    Years: 3.00    Pack years: 0.75    Types: Cigarettes    Quit date: 03/06/2019    Years since quitting: 0.1  . Smokeless tobacco: Never Used  Substance Use Topics  . Alcohol use: No  . Drug use: No    Allergies: No Known Allergies  Medications Prior to Admission  Medication Sig Dispense Refill Last Dose  . albuterol (PROVENTIL) (2.5 MG/3ML) 0.083% nebulizer solution Take 6 mLs (5 mg total) by nebulization every 4 (four) hours as needed for wheezing or shortness of breath. 75 mL 1 05/17/2019 at Unknown time  . betamethasone valerate ointment (VALISONE) 0.1 % Apply topically 2 (two) times daily. 45 g 1 05/17/2019 at Unknown time  . Elastic Bandages & Supports (COMFORT FIT MATERNITY SUPP LG) MISC 1 Units by Does not apply route daily. 1 each 0 05/17/2019 at Unknown time  . mometasone-formoterol (DULERA) 100-5 MCG/ACT AERO Inhale 2 puffs into the lungs 2 (two) times daily. 13 g 1 Past Week  at Unknown time  . predniSONE (DELTASONE) 50 MG tablet Take one tablet by mouth daily for 5 days. 5 tablet 0 Past Month at Unknown time  . VENTOLIN HFA 108 (90 Base) MCG/ACT inhaler INHALE 2 PUFFS INTO THE LUNGS EVERY 4 (FOUR) HOURS AS NEEDED FOR WHEEZING OR SHORTNESS OF BREATH. (Patient not taking: Reported on 04/25/2019) 8 g 2 More than a month at Unknown time  . Cetirizine HCl (ZYRTEC PO) Take by mouth.   More than a month at Unknown time  . Doxylamine-Pyridoxine 10-10 MG TBEC Take two tablets at bedtime on day 1 and 2; if symptoms persist, take 1 tablet in morning and 2 tablets at bedtime on day 3; if symptoms persist, may increase to 1 tablet in morning, 1 tablet mid-afternoon, and 2 tablets at bedtime on day 4 (Patient not taking: Reported on 04/25/2019) 60 tablet 1 More than a month at Unknown time  . fluticasone (FLOVENT HFA) 110 MCG/ACT inhaler Inhale 1 puff into the lungs 2 (two) times daily. 1 Inhaler 12 More than a  month at Unknown time  . Prenatal Vit-Fe Fumarate-FA (PRENATAL VITAMIN) 27-0.8 MG TABS Take 1 tablet by mouth daily. 90 tablet 3 More than a month at Unknown time   Results for orders placed or performed during the hospital encounter of 05/17/19 (from the past 48 hour(s))  Respiratory Panel by RT PCR (Flu A&B, Covid) - Nasopharyngeal Swab     Status: None   Collection Time: 05/17/19  6:33 PM   Specimen: Nasopharyngeal Swab  Result Value Ref Range   SARS Coronavirus 2 by RT PCR NEGATIVE NEGATIVE    Comment: (NOTE) SARS-CoV-2 target nucleic acids are NOT DETECTED. The SARS-CoV-2 RNA is generally detectable in upper respiratoy specimens during the acute phase of infection. The lowest concentration of SARS-CoV-2 viral copies this assay can detect is 131 copies/mL. A negative result does not preclude SARS-Cov-2 infection and should not be used as the sole basis for treatment or other patient management decisions. A negative result may occur with  improper specimen collection/handling, submission of specimen other than nasopharyngeal swab, presence of viral mutation(s) within the areas targeted by this assay, and inadequate number of viral copies (<131 copies/mL). A negative result must be combined with clinical observations, patient history, and epidemiological information. The expected result is Negative. Fact Sheet for Patients:  https://www.moore.com/ Fact Sheet for Healthcare Providers:  https://www.young.biz/ This test is not yet ap proved or cleared by the [place] FDA and  has been authorized for detection and/or diagnosis of SARS-CoV-2 by FDA under an Emergency Use Authorization (EUA). This EUA will remain  in effect (meaning this test can be used) for the duration of the COVID-19 declaration under Section 564(b)(1) of the Act, 21 U.S.C. section 360bbb-3(b)(1), unless the authorization is terminated or revoked sooner.    Influenza A by  PCR NEGATIVE NEGATIVE   Influenza B by PCR NEGATIVE NEGATIVE    Comment: (NOTE) The Xpert Xpress SARS-CoV-2/FLU/RSV assay is intended as an aid in  the diagnosis of influenza from Nasopharyngeal swab specimens and  should not be used as a sole basis for treatment. Nasal washings and  aspirates are unacceptable for Xpert Xpress SARS-CoV-2/FLU/RSV  testing. Fact Sheet for Patients: https://www.moore.com/ Fact Sheet for Healthcare Providers: https://www.young.biz/ This test is not yet approved or cleared by the [place] FDA and  has been authorized for detection and/or diagnosis of SARS-CoV-2 by  FDA under an Emergency Use Authorization (EUA). This EUA will remain  in  effect (meaning this test can be used) for the duration of the  Covid-19 declaration under Section 564(b)(1) of the Act, 21  U.S.C. section 360bbb-3(b)(1), unless the authorization is  terminated or revoked. Performed at [facility], [place] [address] [place], [place] [address]    DG Chest 2 View  Result Date: 05/17/2019 CLINICAL DATA:  Fifteen weeks pregnant. Shortness of breath. COVID-19 negative EXAM: CHEST - 2 VIEW COMPARISON:  March 26, 2018 FINDINGS: The heart size is normal. The hila and mediastinum are normal. No pneumothorax. No nodules or masses. No focal infiltrates. No overt edema. IMPRESSION: No active cardiopulmonary disease. Electronically Signed   By: Dorise Bullion III M.D   On: 05/17/2019 20:34   Review of Systems  Constitutional: Negative for fever.  HENT: Positive for congestion. Negative for rhinorrhea.   Respiratory: Positive for wheezing. Negative for cough and shortness of breath.   Cardiovascular: Negative for chest pain, palpitations and leg swelling.   Physical Exam   Blood pressure (!) 142/83, pulse 100, temperature 97.8 F (36.6 C), temperature source Oral, resp. rate 18, weight 122.5 kg, last menstrual period 01/09/2019, SpO2 99 %, unknown  if currently breastfeeding.   Patient Vitals for the past 24 hrs:  BP Temp Temp src Pulse Resp SpO2 Weight  05/17/19 2135 (!) 142/83 97.8 F (36.6 C) Oral 100 18 99 % --  05/17/19 1811 120/76 (!) 97.5 F (36.4 C) -- (!) 113 18 -- --  05/17/19 1810 -- -- -- -- -- 99 % 122.5 kg    Physical Exam  Constitutional: She appears well-developed and well-nourished.  Non-toxic appearance. She does not have a sickly appearance. She does not appear ill. No distress.  Cardiovascular: Normal heart sounds.  Respiratory: Effort normal. No accessory muscle usage. No respiratory distress. She has decreased breath sounds. She has wheezes in the right middle field, the right lower field, the left middle field and the left lower field. She has no rhonchi. She has no rales.  Neurological: She is alert.  Skin: She is not diaphoretic.    MAU Course  Procedures  None  MDM  Pulse ox 99 % on RA Prednisone 60 mg given in MAU Covid swab negative albutuerol inhaler with areochamber given Benadryl 50 mg given PO Chest Xray WNL + fetal heart tones via doppler.  Patient feeling much better after albuterol inhaler use. States her wheezing and asthma symptoms are consistent with her daily normal. She denies SOB at the time of discharge. Pulse ox 99 % Long discussion regarding having a pulmonologist to manage her asthma. Contact information given to the [facility] allergy [facility].   Assessment and Plan   A:  1. Moderate persistent asthma with exacerbation   2. Shortness of breath   3. Asthma   4. [redacted] weeks gestation of pregnancy   5. Wheezing   6. Moderate persistent asthma without complication      P:  Discharge home in stable condition  Call the asthma and allergy office on Monday morning.  Rx: Montelukast, Increase prednisone to 60 mg X 5 days, benadryl.  Discussed the importance of use Dulero every single day BID regardless of symptoms. This is not a short acting inhaler.  If symptoms worsen  go to [facility] ED.  Albuterol is a rescue inhaler only.  Call OB on Monday for a hospital f/u  Denetria Luevanos, Artist Pais, NP 05/17/2019 9:43 PM

## 2019-05-17 NOTE — Discharge Instructions (Signed)
Asthma Attack Prevention, Adult Although you may not be able to control the fact that you have asthma, you can take actions to prevent episodes of asthma (asthma attacks). These actions include:  Creating a written plan for managing and treating your asthma attacks (asthma action plan).  Monitoring your asthma.  Avoiding things that can irritate your airways or make your asthma symptoms worse (asthma triggers).  Taking your medicines as directed.  Acting quickly if you have signs or symptoms of an asthma attack. What are some ways to prevent an asthma attack? Create a plan Work with your health care provider to create an asthma action plan. This plan should include:  A list of your asthma triggers and how to avoid them.  A list of symptoms that you experience during an asthma attack.  Information about when to take medicine and how much medicine to take.  Information to help you understand your peak flow measurements.  Contact information for your health care providers.  Daily actions that you can take to control asthma. Monitor your asthma To monitor your asthma:  Use your peak flow meter every morning and every evening for 2-3 weeks. Record the results in a journal. A drop in your peak flow numbers on one or more days may mean that you are starting to have an asthma attack, even if you are not having symptoms.  When you have asthma symptoms, write them down in a journal.  Avoid asthma triggers Work with your health care provider to find out what your asthma triggers are. This can be done by:  Being tested for allergies.  Keeping a journal that notes when asthma attacks occur and what may have contributed to them.  Asking your health care provider whether other medical conditions make your asthma worse. Common asthma triggers include:  Dust.  Smoke. This includes campfire smoke and secondhand smoke from tobacco products.  Pet dander.  Trees, grasses or  pollens.  Very cold, dry, or humid air.  Mold.  Foods that contain high amounts of sulfites.  Strong smells.  Engine exhaust and air pollution.  Aerosol sprays and fumes from household cleaners.  Household pests and their droppings, including dust mites and cockroaches.  Certain medicines, including NSAIDs. Once you have determined your asthma triggers, take steps to avoid them. Depending on your triggers, you may be able to reduce the chance of an asthma attack by:  Keeping your home clean. Have someone dust and vacuum your home for you 1 or 2 times a week. If possible, have them use a high-efficiency particulate arrestance (HEPA) vacuum.  Washing your sheets weekly in hot water.  Using allergy-proof mattress covers and casings on your bed.  Keeping pets out of your home.  Taking care of mold and water problems in your home.  Avoiding areas where people smoke.  Avoiding using strong perfumes or odor sprays.  Avoid spending a lot of time outdoors when pollen counts are high and on very windy days.  Talking with your health care provider before stopping or starting any new medicines. Medicines Take over-the-counter and prescription medicines only as told by your health care provider. Many asthma attacks can be prevented by carefully following your medicine schedule. Taking your medicines correctly is especially important when you cannot avoid certain asthma triggers. Even if you are doing well, do not stop taking your medicine and do not take less medicine. Act quickly If an asthma attack happens, acting quickly can decrease how severe it is and   how long it lasts. Take these actions:  Pay attention to your symptoms. If you are coughing, wheezing, or having difficulty breathing, do not wait to see if your symptoms go away on their own. Follow your asthma action plan.  If you have followed your asthma action plan and your symptoms are not improving, call your health care  provider or seek immediate medical care at the nearest hospital. It is important to write down how often you need to use your fast-acting rescue inhaler. You can track how often you use an inhaler in your journal. If you are using your rescue inhaler more often, it may mean that your asthma is not under control. Adjusting your asthma treatment plan may help you to prevent future asthma attacks and help you to gain better control of your condition. How can I prevent an asthma attack when I exercise? Exercise is a common asthma trigger. To prevent asthma attacks during exercise:  Follow advice from your health care provider about whether you should use your fast-acting inhaler before exercising. Many people with asthma experience exercise-induced bronchoconstriction (EIB). This condition often worsens during vigorous exercise in cold, humid, or dry environments. Usually, people with EIB can stay very active by using a fast-acting inhaler before exercising.  Avoid exercising outdoors in very cold or humid weather.  Avoid exercising outdoors when pollen counts are high.  Warm up and cool down when exercising.  Stop exercising right away if asthma symptoms start. Consider taking part in exercises that are less likely to cause asthma symptoms such as:  Indoor swimming.  Biking.  Walking.  Hiking.  Playing football. This information is not intended to replace advice given to you by your health care provider. Make sure you discuss any questions you have with your health care provider. Document Revised: 03/17/2017 Document Reviewed: 09/19/2015 Elsevier Patient Education  2020 Elsevier Inc.  

## 2019-05-17 NOTE — Progress Notes (Signed)
 [facility] Telemedicine Visit  Patient consented to have virtual visit. Method of visit: Telephone  Encounter participants: Patient: Gail Walker - located at home Provider: Marthenia Rolling - located at home telemed Others (if applicable):   Chief Complaint: uncontrolled asthma in setting of second trimester pregnancy  HPI: Patient with uncontrolled asthma, multiple appts for courses of prednisone over the last year.  Also currently [redacted]wk pregnant.  Has albuterol but has run out of both flovent and dulera.  Says when it gets like this she normally needs prednisone to get over it.  No fever, no known sick contact, no one smokign around her, no change in taste/smell.  Says she is using albuterol "50-100 puffs" per day and has 3 inhalers left.   Has considered going to the ED because she is tired of using albuterol so much but has not felt like she needed it from a respiratory standpoint.    ROS: per HPI  Pertinent PMHx: uncontrolled asthma, 2nd trimester pregnant currently, hx of iufd at <20wks  Exam:  Respiratory: speaking in full calm sentences on the phone, no indication of respiratory distress on phone, no cough, appropriately discussing plan and asking appropriate questions  Assessment/Plan:  Supervision of high risk pregnancy in second trimester Able to connect with patient on phone, discussed referal to [facility] for pregnancy management due to uncontrolled asthma.  Patient understands and will call the [facility] center to schedule appt asap.  Re-ordered prenatal because she was not taking one  Asthma Uncontrolled asthma reporting 50-100 puffs of albuterol daily, speaking calmly in no distress on phone today  -Prednisone 50mg  x 5 days (discussed risks/benefits of prednisone in setting of pregnancy) -Refilled dulera controller -advised to go to ED if respiration worsens and schedule f/u with OB immediately to begin pregnancy management.    Time spent during visit  with patient: 10 minutes

## 2019-05-17 NOTE — Assessment & Plan Note (Signed)
Uncontrolled asthma reporting 50-100 puffs of albuterol daily, speaking calmly in no distress on phone today  -Prednisone 50mg  x 5 days (discussed risks/benefits of prednisone in setting of pregnancy) -Refilled dulera controller -advised to go to ED if respiration worsens and schedule f/u with OB immediately to begin pregnancy management.

## 2019-06-04 ENCOUNTER — Inpatient Hospital Stay (HOSPITAL_COMMUNITY)
Admission: AD | Admit: 2019-06-04 | Discharge: 2019-06-04 | Disposition: A | Discharge status: Home / Self Care | Payer: Medicaid Other | Attending: Family Medicine | Admitting: Family Medicine

## 2019-06-04 ENCOUNTER — Encounter (HOSPITAL_COMMUNITY): Payer: Self-pay | Admitting: Family Medicine

## 2019-06-04 ENCOUNTER — Other Ambulatory Visit: Payer: Self-pay

## 2019-06-04 DIAGNOSIS — Z79899 Other long term (current) drug therapy: Secondary | ICD-10-CM | POA: Diagnosis not present

## 2019-06-04 DIAGNOSIS — O99342 Other mental disorders complicating pregnancy, second trimester: Secondary | ICD-10-CM | POA: Diagnosis not present

## 2019-06-04 DIAGNOSIS — F419 Anxiety disorder, unspecified: Secondary | ICD-10-CM | POA: Diagnosis not present

## 2019-06-04 DIAGNOSIS — O99512 Diseases of the respiratory system complicating pregnancy, second trimester: Secondary | ICD-10-CM | POA: Diagnosis not present

## 2019-06-04 DIAGNOSIS — Z7952 Long term (current) use of systemic steroids: Secondary | ICD-10-CM | POA: Diagnosis not present

## 2019-06-04 DIAGNOSIS — O26892 Other specified pregnancy related conditions, second trimester: Secondary | ICD-10-CM | POA: Diagnosis present

## 2019-06-04 DIAGNOSIS — J45909 Unspecified asthma, uncomplicated: Secondary | ICD-10-CM | POA: Insufficient documentation

## 2019-06-04 DIAGNOSIS — Z87891 Personal history of nicotine dependence: Secondary | ICD-10-CM | POA: Diagnosis not present

## 2019-06-04 DIAGNOSIS — Z3A18 18 weeks gestation of pregnancy: Secondary | ICD-10-CM | POA: Insufficient documentation

## 2019-06-04 DIAGNOSIS — R109 Unspecified abdominal pain: Secondary | ICD-10-CM | POA: Diagnosis not present

## 2019-06-04 DIAGNOSIS — O26899 Other specified pregnancy related conditions, unspecified trimester: Secondary | ICD-10-CM

## 2019-06-04 DIAGNOSIS — Z7951 Long term (current) use of inhaled steroids: Secondary | ICD-10-CM | POA: Diagnosis not present

## 2019-06-04 DIAGNOSIS — R102 Pelvic and perineal pain: Secondary | ICD-10-CM | POA: Diagnosis not present

## 2019-06-04 LAB — URINALYSIS, ROUTINE W REFLEX MICROSCOPIC
Bilirubin Urine: NEGATIVE
Glucose, UA: NEGATIVE mg/dL
Hgb urine dipstick: NEGATIVE
Ketones, ur: NEGATIVE mg/dL
Leukocytes,Ua: NEGATIVE
Nitrite: NEGATIVE
Protein, ur: NEGATIVE mg/dL
Specific Gravity, Urine: 1.026 (ref 1.005–1.030)
pH: 7 (ref 5.0–8.0)

## 2019-06-04 NOTE — MAU Note (Signed)
PT SAYS SHE HAS HAD CRAMPING - STARTED SAT NIGHT -  NO MEDS .  PNC - WITH FAMINA.  LAST SEX-  2 WEEKS  AGO.

## 2019-06-04 NOTE — MAU Provider Note (Signed)
Chief Complaint:  Abdominal Pain   First Provider Initiated Contact with Patient 06/04/19 2237     HPI: Gail Walker is a 29 y.o. [id number] at 70w2dwho presents to maternity admissions reporting lower abdominal cramping, off and on.  Worried this might be a miscarriage, as she had one at 17wks last pregnancy (baby measured 13 wks but was 17wks by dates).  Wants to hear heartbeat. . She denies LOF, vaginal bleeding, vaginal itching/burning, urinary symptoms, h/a, dizziness, n/v, diarrhea, constipation or fever/chills.  Abdominal Pain This is a new problem. The current episode started in the past 7 days. The onset quality is gradual. The problem occurs intermittently. The problem has been unchanged. The pain is located in the LLQ, RLQ and suprapubic region. The quality of the pain is cramping. The abdominal pain does not radiate. Pertinent negatives include no constipation, diarrhea, dysuria, fever, frequency, myalgias, nausea or vomiting. Nothing aggravates the pain. The pain is relieved by nothing. She has tried nothing for the symptoms.    RN Note: PT SAYS SHE HAS HAD CRAMPING - STARTED SAT NIGHT -  NO MEDS .  PNC - WITH FAMINA.  LAST SEX-  2 WEEKS  AGO  Past Medical History: Past Medical History:  Diagnosis Date  . Acne   . Asthma    uses albuterol inhaler daily  . CAP (community acquired pneumonia) 11/14/2015  . Chlamydia 2012  . GERD (gastroesophageal reflux disease)    during pregnancy - takes zantac    Past obstetric history: OB History  Gravida Para Term Preterm AB Living  6 1 1  0 4 1  SAB TAB Ectopic Multiple Live Births  1 3 0 0 1    # Outcome Date GA Lbr Len/2nd Weight Sex Delivery Anes PTL Lv  6 Current           5 SAB 05/14/18        FD  4 TAB 2017          3 TAB 2014          2 Term 07/11/11 [redacted]w[redacted]d 19:42 / 02:26 3500 g M Vag-Spont EPI  LIV  1 TAB 07/2010            Past Surgical History: Past Surgical History:  Procedure Laterality Date  . DILATION AND  EVACUATION N/A 05/16/2018   Procedure: DILATATION AND EVACUATION;  Surgeon: 05/18/2018, MD;  Location: [facility];  Service: Gynecology;  Laterality: N/A;  . OPERATIVE ULTRASOUND N/A 05/16/2018   Procedure: OPERATIVE ULTRASOUND;  Surgeon: 05/18/2018, MD;  Location: [facility];  Service: Gynecology;  Laterality: N/A;  . pregnancy termination      Family History: Family History  Problem Relation Age of Onset  . Diabetes Father   . Cancer Maternal Aunt   . Anesthesia problems Neg Hx   . Hearing loss Neg Hx   . Stroke Neg Hx   . CAD Neg Hx     Social History: Social History   Tobacco Use  . Smoking status: Former Smoker    Packs/day: 0.25    Years: 3.00    Pack years: 0.75    Types: Cigarettes    Quit date: 03/06/2019    Years since quitting: 0.2  . Smokeless tobacco: Never Used  Substance Use Topics  . Alcohol use: No  . Drug use: No    Allergies: No Known Allergies  Meds:  Medications Prior to Admission  Medication Sig Dispense Refill Last Dose  .  albuterol (PROVENTIL) (2.5 MG/3ML) 0.083% nebulizer solution Take 6 mLs (5 mg total) by nebulization every 4 (four) hours as needed for wheezing or shortness of breath. 75 mL 1 06/04/2019 at Unknown time  . betamethasone valerate ointment (VALISONE) 0.1 % Apply topically 2 (two) times daily. 45 g 1 Past Week at Unknown time  . diphenhydrAMINE (BENADRYL) 25 MG tablet Take 1 tablet (25 mg total) by mouth at bedtime as needed for allergies or sleep. 30 tablet 1 Past Month at Unknown time  . Elastic Bandages & Supports (COMFORT FIT MATERNITY SUPP LG) MISC 1 Units by Does not apply route daily. 1 each 0 Past Week at Unknown time  . fluticasone (FLOVENT HFA) 110 MCG/ACT inhaler Inhale 1 puff into the lungs 2 (two) times daily. 1 Inhaler 12 06/03/2019 at Unknown time  . mometasone-formoterol (DULERA) 100-5 MCG/ACT AERO Inhale 2 puffs into the lungs 2 (two) times daily. 13 g 1 06/03/2019 at Unknown  time  . montelukast (SINGULAIR) 10 MG tablet Take 1 tablet (10 mg total) by mouth at bedtime. 90 tablet 2 Past Week at Unknown time  . predniSONE (DELTASONE) 20 MG tablet Take 3 tablets (60 mg total) by mouth daily. 12 tablet 0 Past Week at Unknown time  . VENTOLIN HFA 108 (90 Base) MCG/ACT inhaler INHALE 2 PUFFS INTO THE LUNGS EVERY 4 (FOUR) HOURS AS NEEDED FOR WHEEZING OR SHORTNESS OF BREATH. (Patient not taking: Reported on 04/25/2019) 8 g 2   . Doxylamine-Pyridoxine 10-10 MG TBEC Take two tablets at bedtime on day 1 and 2; if symptoms persist, take 1 tablet in morning and 2 tablets at bedtime on day 3; if symptoms persist, may increase to 1 tablet in morning, 1 tablet mid-afternoon, and 2 tablets at bedtime on day 4 (Patient not taking: Reported on 04/25/2019) 60 tablet 1   . Prenatal Vit-Fe Fumarate-FA (PRENATAL VITAMIN) 27-0.8 MG TABS Take 1 tablet by mouth daily. 90 tablet 3     I have reviewed patient's Past Medical Hx, Surgical Hx, Family Hx, Social Hx, medications and allergies.   ROS:  Review of Systems  Constitutional: Negative for chills and fever.  Gastrointestinal: Positive for abdominal pain. Negative for constipation, diarrhea, nausea and vomiting.  Genitourinary: Positive for pelvic pain. Negative for dysuria, frequency, vaginal bleeding and vaginal discharge.  Musculoskeletal: Negative for back pain and myalgias.  Neurological: Negative for dizziness.   Other systems negative  Physical Exam   Patient Vitals for the past 24 hrs:  BP Temp Temp src Pulse Resp Height Weight  06/04/19 2117 130/82 98.5 F (36.9 C) Oral (!) 109 20 5\' 4"  (1.626 m) 124.9 kg   Constitutional: Well-developed, well-nourished female in no acute distress.  Cardiovascular: normal rate and rhythm Respiratory: normal effort, clear to auscultation bilaterally GI: Abd soft, non-tender, gravid appropriate for gestational age.   No rebound or guarding. MS: Extremities nontender, no edema, normal  ROM Neurologic: Alert and oriented x 4.  GU: Neg CVAT.  PELVIC EXAM:  Cervix long and closed   FHT:  158  Very relieved to hear FHTs   Labs: Results for orders placed or performed during the hospital encounter of 06/04/19 (from the past 24 hour(s))  Urinalysis, Routine w reflex microscopic     Status: Abnormal   Collection Time: 06/04/19  9:24 PM  Result Value Ref Range   Color, Urine YELLOW YELLOW   APPearance HAZY (A) CLEAR   Specific Gravity, Urine 1.026 1.005 - 1.030   pH 7.0 5.0 - 8.0  Glucose, UA NEGATIVE NEGATIVE mg/dL   Hgb urine dipstick NEGATIVE NEGATIVE   Bilirubin Urine NEGATIVE NEGATIVE   Ketones, ur NEGATIVE NEGATIVE mg/dL   Protein, ur NEGATIVE NEGATIVE mg/dL   Nitrite NEGATIVE NEGATIVE   Leukocytes,Ua NEGATIVE NEGATIVE   --/--/A NEG (12/12 2047)  Imaging:    MAU Course/MDM: I have ordered labs and reviewed results. UA is clear. Reassured to hear FHTs.  Was worried about miscarriage.  Wants to go home now.  .   Has anatomy US scheduled soon.  Plans transfer to our clinic due to problems with asthma (see Dr Tedra Senegal note)  Assessment: Single intrauterine pregnancy at [redacted]w[redacted]d Pelvic cramping, resolved Anxiety over fetal well-being  Plan: Discharge home PO hydration Follow up in Office for prenatal visits and [place]  Encouraged to return here or to other Urgent Care/ED if she develops worsening of symptoms, increase in pain, fever, or other concerning symptoms.   Pt stable at time of discharge.  Wynelle Bourgeois CNM, MSN Certified Nurse-Midwife 06/04/2019 10:37 PM

## 2019-06-04 NOTE — Discharge Instructions (Signed)
Abdominal Pain During Pregnancy  Abdominal pain is common during pregnancy, and has many possible causes. Some causes are more serious than others, and sometimes the cause is not known. Abdominal pain can be a sign that labor is starting. It can also be caused by normal growth and stretching of muscles and ligaments during pregnancy. Always tell your health care provider if you have any abdominal pain. Follow these instructions at home:  Do not have sex or put anything in your vagina until your pain goes away completely.  Get plenty of rest until your pain improves.  Drink enough fluid to keep your urine pale yellow.  Take over-the-counter and prescription medicines only as told by your health care provider.  Keep all follow-up visits as told by your health care provider. This is important. Contact a health care provider if:  Your pain continues or gets worse after resting.  You have lower abdominal pain that: ? Comes and goes at regular intervals. ? Spreads to your back. ? Is similar to menstrual cramps.  You have pain or burning when you urinate. Get help right away if:  You have a fever or chills.  You have vaginal bleeding.  You are leaking fluid from your vagina.  You are passing tissue from your vagina.  You have vomiting or diarrhea that lasts for more than 24 hours.  Your baby is moving less than usual.  You feel very weak or faint.  You have shortness of breath.  You develop severe pain in your upper abdomen. Summary  Abdominal pain is common during pregnancy, and has many possible causes.  If you experience abdominal pain during pregnancy, tell your health care provider right away.  Follow your health care provider's home care instructions and keep all follow-up visits as directed. This information is not intended to replace advice given to you by your health care provider. Make sure you discuss any questions you have with your health care  provider. Document Revised: 07/23/2018 Document Reviewed: 07/07/2016 Elsevier Patient Education  2020 Elsevier Inc.  

## 2019-06-05 ENCOUNTER — Encounter (HOSPITAL_COMMUNITY): Payer: Self-pay | Admitting: Family Medicine

## 2019-06-13 ENCOUNTER — Other Ambulatory Visit (HOSPITAL_COMMUNITY): Payer: Self-pay | Admitting: *Deleted

## 2019-06-13 ENCOUNTER — Other Ambulatory Visit: Payer: Self-pay

## 2019-06-13 ENCOUNTER — Ambulatory Visit (HOSPITAL_COMMUNITY): Payer: Medicaid Other | Admitting: *Deleted

## 2019-06-13 ENCOUNTER — Encounter (HOSPITAL_COMMUNITY): Payer: Self-pay

## 2019-06-13 ENCOUNTER — Ambulatory Visit (HOSPITAL_COMMUNITY)
Admission: RE | Admit: 2019-06-13 | Discharge: 2019-06-13 | Disposition: A | Discharge status: Home / Self Care | Payer: Medicaid Other | Source: Ambulatory Visit | Attending: Obstetrics and Gynecology | Admitting: Obstetrics and Gynecology

## 2019-06-13 VITALS — BP 124/79 | HR 115 | Temp 97.5°F

## 2019-06-13 DIAGNOSIS — J45909 Unspecified asthma, uncomplicated: Secondary | ICD-10-CM

## 2019-06-13 DIAGNOSIS — O99891 Other specified diseases and conditions complicating pregnancy: Secondary | ICD-10-CM | POA: Diagnosis not present

## 2019-06-13 DIAGNOSIS — O99332 Smoking (tobacco) complicating pregnancy, second trimester: Secondary | ICD-10-CM

## 2019-06-13 DIAGNOSIS — O99212 Obesity complicating pregnancy, second trimester: Secondary | ICD-10-CM | POA: Diagnosis present

## 2019-06-13 DIAGNOSIS — Z8759 Personal history of other complications of pregnancy, childbirth and the puerperium: Secondary | ICD-10-CM | POA: Diagnosis present

## 2019-06-13 DIAGNOSIS — Z3A19 19 weeks gestation of pregnancy: Secondary | ICD-10-CM

## 2019-06-13 DIAGNOSIS — Z362 Encounter for other antenatal screening follow-up: Secondary | ICD-10-CM

## 2019-07-12 ENCOUNTER — Ambulatory Visit (HOSPITAL_COMMUNITY): Payer: Medicaid Other | Admitting: *Deleted

## 2019-07-12 ENCOUNTER — Ambulatory Visit (HOSPITAL_COMMUNITY)
Admission: RE | Admit: 2019-07-12 | Discharge: 2019-07-12 | Disposition: A | Discharge status: Home / Self Care | Payer: Medicaid Other | Source: Ambulatory Visit | Attending: Obstetrics and Gynecology | Admitting: Obstetrics and Gynecology

## 2019-07-12 ENCOUNTER — Other Ambulatory Visit: Payer: Self-pay

## 2019-07-12 ENCOUNTER — Other Ambulatory Visit (HOSPITAL_COMMUNITY): Payer: Self-pay | Admitting: *Deleted

## 2019-07-12 ENCOUNTER — Encounter (HOSPITAL_COMMUNITY): Payer: Self-pay

## 2019-07-12 VITALS — BP 126/75 | HR 102

## 2019-07-12 DIAGNOSIS — O09299 Supervision of pregnancy with other poor reproductive or obstetric history, unspecified trimester: Secondary | ICD-10-CM

## 2019-07-12 DIAGNOSIS — O0990 Supervision of high risk pregnancy, unspecified, unspecified trimester: Secondary | ICD-10-CM | POA: Diagnosis present

## 2019-07-12 DIAGNOSIS — O99891 Other specified diseases and conditions complicating pregnancy: Secondary | ICD-10-CM

## 2019-07-12 DIAGNOSIS — J45909 Unspecified asthma, uncomplicated: Secondary | ICD-10-CM

## 2019-07-12 DIAGNOSIS — Z362 Encounter for other antenatal screening follow-up: Secondary | ICD-10-CM | POA: Diagnosis present

## 2019-07-12 DIAGNOSIS — O360190 Maternal care for anti-D [Rh] antibodies, unspecified trimester, not applicable or unspecified: Secondary | ICD-10-CM

## 2019-07-12 DIAGNOSIS — O99213 Obesity complicating pregnancy, third trimester: Secondary | ICD-10-CM | POA: Diagnosis not present

## 2019-07-12 DIAGNOSIS — O99333 Smoking (tobacco) complicating pregnancy, third trimester: Secondary | ICD-10-CM

## 2019-07-12 DIAGNOSIS — Z3A23 23 weeks gestation of pregnancy: Secondary | ICD-10-CM | POA: Diagnosis not present

## 2019-07-12 DIAGNOSIS — O99210 Obesity complicating pregnancy, unspecified trimester: Secondary | ICD-10-CM

## 2019-07-31 ENCOUNTER — Other Ambulatory Visit: Payer: Self-pay | Admitting: Family Medicine

## 2019-08-01 NOTE — Telephone Encounter (Signed)
Can you clarify which of these the patient wants? She shouldn't need both of these.  Thanks! Jill Side

## 2019-08-05 NOTE — Telephone Encounter (Signed)
Attempted to call patient. Phone went straight to VM, mailbox full.   Will attempt later  Veronda Prude, RN

## 2019-08-09 ENCOUNTER — Other Ambulatory Visit: Payer: Self-pay

## 2019-08-09 ENCOUNTER — Other Ambulatory Visit (HOSPITAL_COMMUNITY): Payer: Self-pay | Admitting: *Deleted

## 2019-08-09 ENCOUNTER — Ambulatory Visit (HOSPITAL_COMMUNITY): Payer: Medicaid Other | Admitting: *Deleted

## 2019-08-09 ENCOUNTER — Encounter (HOSPITAL_COMMUNITY): Payer: Self-pay

## 2019-08-09 ENCOUNTER — Ambulatory Visit (HOSPITAL_COMMUNITY)
Admission: RE | Admit: 2019-08-09 | Discharge: 2019-08-09 | Disposition: A | Discharge status: Home / Self Care | Payer: Medicaid Other | Source: Ambulatory Visit | Attending: Obstetrics and Gynecology | Admitting: Obstetrics and Gynecology

## 2019-08-09 VITALS — BP 114/69 | HR 109 | Temp 97.8°F

## 2019-08-09 DIAGNOSIS — E669 Obesity, unspecified: Secondary | ICD-10-CM

## 2019-08-09 DIAGNOSIS — O0990 Supervision of high risk pregnancy, unspecified, unspecified trimester: Secondary | ICD-10-CM | POA: Diagnosis present

## 2019-08-09 DIAGNOSIS — Z3A27 27 weeks gestation of pregnancy: Secondary | ICD-10-CM

## 2019-08-09 DIAGNOSIS — O99333 Smoking (tobacco) complicating pregnancy, third trimester: Secondary | ICD-10-CM

## 2019-08-09 DIAGNOSIS — O09293 Supervision of pregnancy with other poor reproductive or obstetric history, third trimester: Secondary | ICD-10-CM | POA: Diagnosis not present

## 2019-08-09 DIAGNOSIS — Z362 Encounter for other antenatal screening follow-up: Secondary | ICD-10-CM

## 2019-08-09 DIAGNOSIS — Z363 Encounter for antenatal screening for malformations: Secondary | ICD-10-CM

## 2019-08-09 DIAGNOSIS — O99210 Obesity complicating pregnancy, unspecified trimester: Secondary | ICD-10-CM

## 2019-08-09 DIAGNOSIS — O99213 Obesity complicating pregnancy, third trimester: Secondary | ICD-10-CM | POA: Diagnosis not present

## 2019-08-09 DIAGNOSIS — O99891 Other specified diseases and conditions complicating pregnancy: Secondary | ICD-10-CM

## 2019-08-09 DIAGNOSIS — O360190 Maternal care for anti-D [Rh] antibodies, unspecified trimester, not applicable or unspecified: Secondary | ICD-10-CM

## 2019-08-09 DIAGNOSIS — J45909 Unspecified asthma, uncomplicated: Secondary | ICD-10-CM

## 2019-08-09 DIAGNOSIS — F17200 Nicotine dependence, unspecified, uncomplicated: Secondary | ICD-10-CM

## 2019-08-16 ENCOUNTER — Ambulatory Visit (INDEPENDENT_AMBULATORY_CARE_PROVIDER_SITE_OTHER): Payer: Medicaid Other | Admitting: Family Medicine

## 2019-08-16 ENCOUNTER — Other Ambulatory Visit: Payer: Self-pay

## 2019-08-16 VITALS — BP 118/70 | HR 115 | Wt 280.8 lb

## 2019-08-16 DIAGNOSIS — O26899 Other specified pregnancy related conditions, unspecified trimester: Secondary | ICD-10-CM | POA: Diagnosis present

## 2019-08-16 DIAGNOSIS — Z23 Encounter for immunization: Secondary | ICD-10-CM | POA: Insufficient documentation

## 2019-08-16 DIAGNOSIS — O0992 Supervision of high risk pregnancy, unspecified, second trimester: Secondary | ICD-10-CM | POA: Diagnosis not present

## 2019-08-16 DIAGNOSIS — Z6791 Unspecified blood type, Rh negative: Secondary | ICD-10-CM

## 2019-08-16 LAB — POCT 1 HR PRENATAL GLUCOSE: Glucose 1 Hr Prenatal, POC: 105 mg/dL

## 2019-08-16 MED ORDER — ALBUTEROL SULFATE (2.5 MG/3ML) 0.083% IN NEBU: 5.0000 mg | INHALATION_SOLUTION | RESPIRATORY_TRACT | 1 refills | Status: DC | PRN

## 2019-08-16 NOTE — Patient Instructions (Signed)
[phone] is the number of the OB/GYN office.  We had trouble reaching them today because they were in the middle of moving locations, it is very important that you get a hold of them and get scheduled to see them immediately.  I have placed an urgent referral so that they can check up on that.  They should be able to see all of the lab results that we pulled today  Dr. Parke Simmers

## 2019-08-16 NOTE — Progress Notes (Signed)
    SUBJECTIVE:   CHIEF COMPLAINT / HPI: Arrange prenatal management  We discussed with patient, she has been referred to [facility] for management of high risk prenatal status.  We discussed that she had been referred because of her uncontrolled asthma, she said she has been going to the ultrasounds with MFM but thought they were her OB.  So when the OB called her to try and schedule her follow-up, she told them that she already had it managed.  She has no symptoms at this time, no complaints, no blood, no discharge, still feels movement.    We have addressed this miscommunication, attempted to call OB but their office is closed today.  We have placed urgent referral to try and get them to call her, as well as given her their number so that she can try to follow-up with them on Monday.  In an attempt to help speed their work-up we have ordered a CBC, HIV, RPR, antibody screen.  She did leave the office for getting RhoGam today so we will call her back on Monday/Tuesday with the results of the antibody to schedule RhoGam.  Of note she did get a dose of RhoGam in December in the MAU.  PERTINENT  PMH / PSH:   OBJECTIVE:   BP 118/70   Pulse (!) 115   Wt 280 lb 12.8 oz (127.4 kg)   LMP 01/09/2019 (Approximate)   BMI 48.20 kg/m   General: Alert, calm, no distress, pleasant Cardiac: Regular rate and rhythm to my exam Respiratory: Regular rate, no increased work of breathing, no cough  ASSESSMENT/PLAN:   Vaccine for diphtheria-tetanus-pertussis, combined Tdap given 4/30  Supervision of high risk pregnancy in second trimester Reminded patient that she was actually referred to [facility] for management of high risk pregnancy due to uncontrolled asthma.  She is breathing well in the office today, we did attempt to call the OB office to schedule her appointment but they are closed today.  We did place an urgent referral for them to try and communicate with her as well as give her their number so that she could  try and reach them on Monday.  We did order a CBC/HIV/RPR/antibody test today.  She left the office before getting RhoGam, so we are waiting for the results of the antibody test before having her likely come back Monday or Tuesday to get a RhoGam shot  Rh negative state in antepartum period Antibody test drawn today, she left her for the RhoGam was given so we are going to call her back once the antibody test results to likely have her come in for RhoGam on Monday or Tuesday.  Prior confirmed Rh-, did get dose of RhoGam in the MAU in December.     Marthenia Rolling, DO [facility]

## 2019-08-16 NOTE — Assessment & Plan Note (Signed)
Reminded patient that she was actually referred to [facility] for management of high risk pregnancy due to uncontrolled asthma.  She is breathing well in the office today, we did attempt to call the OB office to schedule her appointment but they are closed today.  We did place an urgent referral for them to try and communicate with her as well as give her their number so that she could try and reach them on Monday.  We did order a CBC/HIV/RPR/antibody test today.  She left the office before getting RhoGam, so we are waiting for the results of the antibody test before having her likely come back Monday or Tuesday to get a RhoGam shot

## 2019-08-16 NOTE — Assessment & Plan Note (Signed)
Tdap given 4/30

## 2019-08-16 NOTE — Assessment & Plan Note (Addendum)
Antibody test drawn today, she left her for the RhoGam was given so we are going to call her back once the antibody test results to likely have her come in for RhoGam on Monday or Tuesday.  Prior confirmed Rh-, did get dose of RhoGam in the MAU in December.

## 2019-08-17 LAB — CBC
Hematocrit: 32.1 % — ABNORMAL LOW (ref 34.0–46.6)
Hemoglobin: 10.9 g/dL — ABNORMAL LOW (ref 11.1–15.9)
MCH: 27 pg (ref 26.6–33.0)
MCHC: 34 g/dL (ref 31.5–35.7)
MCV: 80 fL (ref 79–97)
Platelets: 248 10*3/uL (ref 150–450)
RBC: 4.03 x10E6/uL (ref 3.77–5.28)
RDW: 14.6 % (ref 11.7–15.4)
WBC: 8.5 10*3/uL (ref 3.4–10.8)

## 2019-08-17 LAB — RPR: RPR Ser Ql: NONREACTIVE

## 2019-08-17 LAB — HIV ANTIBODY (ROUTINE TESTING W REFLEX): HIV Screen 4th Generation wRfx: NONREACTIVE

## 2019-08-17 LAB — ANTIBODY SCREEN: Antibody Screen: NEGATIVE

## 2019-08-22 ENCOUNTER — Telehealth: Payer: Self-pay | Admitting: *Deleted

## 2019-08-22 NOTE — Telephone Encounter (Signed)
Contacted pt and she is aware that she needs to come back in for the rhogam shot. She said she would come in on 08/23/2019 in the morning sometime.Gail Walker, CMA

## 2019-08-22 NOTE — Telephone Encounter (Signed)
-----   Message from Marthenia Rolling, DO sent at 08/21/2019  7:55 PM EDT ----- Regarding: FW: Doptones Please call patient and remind her to get rhogam shot ----- Message ----- From: Westley Chandler, MD Sent: 08/21/2019   6:48 PM EDT To: Marthenia Rolling, DO Subject: RE: Doptones                                   No worries--- it also appears she has not come for Rhogam shot--can you (or the nursing staff) please call her and have administered this week?  Thanks, Carina  ----- Message ----- From: Marthenia Rolling, DO Sent: 08/21/2019   8:36 AM EDT To: Westley Chandler, MD Subject: RE: Doptones                                   Sorry, I didn't use prenatal because we aren't managing her pregnancy and it was an office visit ----- Message ----- From: Westley Chandler, MD Sent: 08/20/2019   6:59 PM EDT To: Marthenia Rolling, DO Subject: Doptones                                       Hi Dr. Parke Simmers,  Do you mind documenting FHR and FH in this note or in the episode?  Also, this is a great visit to use the prenatal templates.    Thanks, [employer]

## 2019-08-23 NOTE — Telephone Encounter (Signed)
LVM to call office to tell her to call before she comes to verify if we have the Rhogam in stock.  It was ordered and one was used and the other had to be discarded due to temps.  Another one was ordered on 08/23/19. I just don't want her to come all the way here and we not have it in stock yet. Gail Walker, CMA

## 2019-08-27 ENCOUNTER — Ambulatory Visit: Payer: Medicaid Other

## 2019-08-27 ENCOUNTER — Other Ambulatory Visit: Payer: Self-pay

## 2019-08-28 ENCOUNTER — Telehealth: Payer: Self-pay

## 2019-08-28 NOTE — Telephone Encounter (Signed)
Called patient again, no answer. LVM to return call to office to schedule nurse visit  Gail Prude, RN

## 2019-08-28 NOTE — Telephone Encounter (Signed)
Called patient to schedule nurse visit for Rhogam injection. Patient did not answer. Let HIPPA compliant VM for patient to return call to office in order to schedule nurse visit.    Veronda Prude, RN

## 2019-08-29 NOTE — Telephone Encounter (Signed)
Called patient again to schedule nurse visit. No answer, left VM for patient to call office for scheduling.   Veronda Prude, RN

## 2019-08-30 ENCOUNTER — Ambulatory Visit: Payer: Medicaid Other

## 2019-08-31 ENCOUNTER — Other Ambulatory Visit: Payer: Self-pay

## 2019-08-31 ENCOUNTER — Inpatient Hospital Stay (HOSPITAL_COMMUNITY)
Admission: AD | Admit: 2019-08-31 | Discharge: 2019-08-31 | Disposition: A | Discharge status: Home / Self Care | Payer: Medicaid Other | Attending: Obstetrics & Gynecology | Admitting: Obstetrics & Gynecology

## 2019-08-31 ENCOUNTER — Encounter (HOSPITAL_COMMUNITY): Payer: Self-pay | Admitting: Obstetrics & Gynecology

## 2019-08-31 DIAGNOSIS — J45909 Unspecified asthma, uncomplicated: Secondary | ICD-10-CM | POA: Insufficient documentation

## 2019-08-31 DIAGNOSIS — Z87891 Personal history of nicotine dependence: Secondary | ICD-10-CM | POA: Insufficient documentation

## 2019-08-31 DIAGNOSIS — Z833 Family history of diabetes mellitus: Secondary | ICD-10-CM | POA: Diagnosis not present

## 2019-08-31 DIAGNOSIS — O99513 Diseases of the respiratory system complicating pregnancy, third trimester: Secondary | ICD-10-CM | POA: Insufficient documentation

## 2019-08-31 DIAGNOSIS — O368130 Decreased fetal movements, third trimester, not applicable or unspecified: Secondary | ICD-10-CM | POA: Diagnosis present

## 2019-08-31 DIAGNOSIS — Z3A30 30 weeks gestation of pregnancy: Secondary | ICD-10-CM | POA: Diagnosis not present

## 2019-08-31 DIAGNOSIS — Z3689 Encounter for other specified antenatal screening: Secondary | ICD-10-CM | POA: Diagnosis not present

## 2019-08-31 NOTE — Discharge Instructions (Signed)
Fetal Movement Counts Patient Name: ________________________________________________ Patient Due Date: ____________________ What is a fetal movement count?  A fetal movement count is the number of times that you feel your baby move during a certain amount of time. This may also be called a fetal kick count. A fetal movement count is recommended for every pregnant woman. You may be asked to start counting fetal movements as early as week 28 of your pregnancy. Pay attention to when your baby is most active. You may notice your baby's sleep and wake cycles. You may also notice things that make your baby move more. You should do a fetal movement count:  When your baby is normally most active.  At the same time each day. A good time to count movements is while you are resting, after having something to eat and drink. How do I count fetal movements? 1. Find a quiet, comfortable area. Sit, or lie down on your side. 2. Write down the date, the start time and stop time, and the number of movements that you felt between those two times. Take this information with you to your health care visits. 3. Write down your start time when you feel the first movement. 4. Count kicks, flutters, swishes, rolls, and jabs. You should feel at least 10 movements. 5. You may stop counting after you have felt 10 movements, or if you have been counting for 2 hours. Write down the stop time. 6. If you do not feel 10 movements in 2 hours, contact your health care provider for further instructions. Your health care provider may want to do additional tests to assess your baby's well-being. Contact a health care provider if:  You feel fewer than 10 movements in 2 hours.  Your baby is not moving like he or she usually does. Date: ____________ Start time: ____________ Stop time: ____________ Movements: ____________ Date: ____________ Start time: ____________ Stop time: ____________ Movements: ____________ Date: ____________  Start time: ____________ Stop time: ____________ Movements: ____________ Date: ____________ Start time: ____________ Stop time: ____________ Movements: ____________ Date: ____________ Start time: ____________ Stop time: ____________ Movements: ____________ Date: ____________ Start time: ____________ Stop time: ____________ Movements: ____________ Date: ____________ Start time: ____________ Stop time: ____________ Movements: ____________ Date: ____________ Start time: ____________ Stop time: ____________ Movements: ____________ Date: ____________ Start time: ____________ Stop time: ____________ Movements: ____________ This information is not intended to replace advice given to you by your health care provider. Make sure you discuss any questions you have with your health care provider. Document Revised: 11/22/2018 Document Reviewed: 11/22/2018 Elsevier Patient Education  2020 Elsevier Inc.  

## 2019-08-31 NOTE — MAU Provider Note (Signed)
History     CSN: [id number]  Arrival date and time: 08/31/19 1708   First Provider Initiated Contact with Patient 08/31/19 1732      No chief complaint on file.  HPI Gail Walker is a 29 y.o. A4T3646 at [redacted]w[redacted]d who presents to MAU with chief complaint of decreased fetal movement. Patient states she has not felt her baby move at all today and movement last night was rare. She states her baby is typically active at 10-11pm. She is unaware of interventions to facilitate fetal movement.  She denies vaginal bleeding, leaking of fluid, fever, falls, or recent illness.   She received care with [facility] but is transferring to [facility]. Her next appt is 09/05/2019.  OB History    Gravida  6   Para  1   Term  1   Preterm  0   AB  4   Living  1     SAB  1   TAB  3   Ectopic  0   Multiple  0   Live Births  1           Past Medical History:  Diagnosis Date  . Acne   . Asthma    uses albuterol inhaler daily  . CAP (community acquired pneumonia) 11/14/2015  . Chlamydia 2012  . GERD (gastroesophageal reflux disease)    during pregnancy - takes zantac    Past Surgical History:  Procedure Laterality Date  . DILATION AND EVACUATION N/A 05/16/2018   Procedure: DILATATION AND EVACUATION;  Surgeon: Aletha Halim, MD;  Location: [place];  Service: Gynecology;  Laterality: N/A;  . OPERATIVE ULTRASOUND N/A 05/16/2018   Procedure: OPERATIVE ULTRASOUND;  Surgeon: Aletha Halim, MD;  Location: [place];  Service: Gynecology;  Laterality: N/A;  . pregnancy termination      Family History  Problem Relation Age of Onset  . Diabetes Father   . Cancer Maternal Aunt   . Anesthesia problems Neg Hx   . Hearing loss Neg Hx   . Stroke Neg Hx   . CAD Neg Hx     Social History   Tobacco Use  . Smoking status: Former Smoker    Packs/day: 0.25    Years: 3.00    Pack years: 0.75    Types: Cigarettes    Quit date: 03/06/2019     Years since quitting: 0.4  . Smokeless tobacco: Never Used  Substance Use Topics  . Alcohol use: No  . Drug use: No    Allergies: No Known Allergies  Medications Prior to Admission  Medication Sig Dispense Refill Last Dose  . albuterol (PROVENTIL) (2.5 MG/3ML) 0.083% nebulizer solution Take 6 mLs (5 mg total) by nebulization every 4 (four) hours as needed for wheezing or shortness of breath. 75 mL 1   . betamethasone valerate ointment (VALISONE) 0.1 % Apply topically 2 (two) times daily. 45 g 1   . diphenhydrAMINE (BENADRYL) 25 MG tablet Take 1 tablet (25 mg total) by mouth at bedtime as needed for allergies or sleep. (Patient not taking: Reported on 06/13/2019) 30 tablet 1   . Doxylamine-Pyridoxine 10-10 MG TBEC Take two tablets at bedtime on day 1 and 2; if symptoms persist, take 1 tablet in morning and 2 tablets at bedtime on day 3; if symptoms persist, may increase to 1 tablet in morning, 1 tablet mid-afternoon, and 2 tablets at bedtime on day 4 (Patient not taking: Reported on 04/25/2019) 60 tablet 1   .  Elastic Bandages & Supports (COMFORT FIT MATERNITY SUPP LG) MISC 1 Units by Does not apply route daily. 1 each 0   . fluticasone (FLOVENT HFA) 110 MCG/ACT inhaler Inhale 1 puff into the lungs 2 (two) times daily. (Patient not taking: Reported on 06/13/2019) 1 Inhaler 12   . mometasone-formoterol (DULERA) 100-5 MCG/ACT AERO Inhale 2 puffs into the lungs 2 (two) times daily. 13 g 1   . montelukast (SINGULAIR) 10 MG tablet Take 1 tablet (10 mg total) by mouth at bedtime. 90 tablet 2   . predniSONE (DELTASONE) 20 MG tablet Take 3 tablets (60 mg total) by mouth daily. (Patient not taking: Reported on 07/12/2019) 12 tablet 0   . Prenatal Vit-Fe Fumarate-FA (PRENATAL VITAMIN) 27-0.8 MG TABS Take 1 tablet by mouth daily. 90 tablet 3   . VENTOLIN HFA 108 (90 Base) MCG/ACT inhaler INHALE 2 PUFFS INTO THE LUNGS EVERY 4 (FOUR) HOURS AS NEEDED FOR WHEEZING OR SHORTNESS OF BREATH. (Patient not taking:  Reported on 07/12/2019) 8 g 2     Review of Systems  Gastrointestinal: Negative for abdominal pain.  Genitourinary: Negative for vaginal bleeding.  Musculoskeletal: Negative for back pain.  All other systems reviewed and are negative.  Physical Exam   Blood pressure 114/68, pulse (!) 109, temperature 97.8 F (36.6 C), temperature source Oral, resp. rate 16, last menstrual period 01/09/2019, SpO2 100 %, unknown if currently breastfeeding.  Physical Exam  Nursing note and vitals reviewed. Constitutional: She is oriented to person, place, and time. She appears well-developed and well-nourished.  Cardiovascular: Normal rate and normal heart sounds.  Respiratory: Effort normal and breath sounds normal.  GI: She exhibits no distension. There is no abdominal tenderness. There is no rebound and no guarding.  Gravid  Neurological: She is alert and oriented to person, place, and time.  Skin: Skin is warm and dry.  Psychiatric: She has a normal mood and affect. Her behavior is normal. Judgment and thought content normal.    MAU Course/MDM  Procedures  --Patient given NST button. Endorses 12 movements in less than 30 minutes --Reactive tracing: baseline 140, mod variability, positive 10 x 10 accels, no decels --Toco: quiet  Patient Vitals for the past 24 hrs:  BP Temp Temp src Pulse Resp SpO2  08/31/19 1831 133/72 -- -- (!) 101 16 100 %  08/31/19 1727 114/68 97.8 F (36.6 C) Oral (!) 109 16 100 %   Assessment and Plan  --29 y.o. [id number] at [redacted]w[redacted]d  --No concerning findings during one hour of prolonged monitoring --Patient endorsing fetal movement appropriate for gestational age --Discharge home in stable condition  Calvert Cantor, [place] 08/31/2019, 6:41 PM

## 2019-08-31 NOTE — MAU Note (Signed)
Gail Walker is a 29 y.o. at [redacted]w[redacted]d here in MAU reporting:  Decreased fetal movement Has felt some movement yesterday but none today Movement has been decreasing the past few days patient reports. Denies LOF or vaginal bleeding. Pain score: denies Vitals:   08/31/19 1727  BP: 114/68  Pulse: (!) 109  Resp: 16  Temp: 97.8 F (36.6 C)  SpO2: 100%     FHT:144 Lab orders placed from triage: none

## 2019-09-05 ENCOUNTER — Encounter: Payer: Self-pay | Admitting: Family Medicine

## 2019-09-05 ENCOUNTER — Ambulatory Visit (INDEPENDENT_AMBULATORY_CARE_PROVIDER_SITE_OTHER): Payer: Medicaid Other | Admitting: Family Medicine

## 2019-09-05 ENCOUNTER — Other Ambulatory Visit: Payer: Self-pay

## 2019-09-05 VITALS — BP 123/73 | HR 113 | Wt 274.4 lb

## 2019-09-05 DIAGNOSIS — O99213 Obesity complicating pregnancy, third trimester: Secondary | ICD-10-CM

## 2019-09-05 DIAGNOSIS — O99210 Obesity complicating pregnancy, unspecified trimester: Secondary | ICD-10-CM | POA: Insufficient documentation

## 2019-09-05 DIAGNOSIS — O0990 Supervision of high risk pregnancy, unspecified, unspecified trimester: Secondary | ICD-10-CM

## 2019-09-05 DIAGNOSIS — Z6791 Unspecified blood type, Rh negative: Secondary | ICD-10-CM | POA: Diagnosis not present

## 2019-09-05 DIAGNOSIS — E669 Obesity, unspecified: Secondary | ICD-10-CM

## 2019-09-05 DIAGNOSIS — O26893 Other specified pregnancy related conditions, third trimester: Secondary | ICD-10-CM | POA: Diagnosis not present

## 2019-09-05 DIAGNOSIS — Z3A31 31 weeks gestation of pregnancy: Secondary | ICD-10-CM

## 2019-09-05 HISTORY — DX: Obesity complicating pregnancy, unspecified trimester: O99.210

## 2019-09-05 MED ORDER — BLOOD PRESSURE KIT DEVI: 1.0000 [IU] | 0 refills | Status: DC

## 2019-09-05 MED ORDER — RHO D IMMUNE GLOBULIN 1500 UNIT/2ML IJ SOSY
300.0000 ug | PREFILLED_SYRINGE | Freq: Once | INTRAMUSCULAR | Status: AC
  Administered 2019-09-05: 300 ug via INTRAMUSCULAR

## 2019-09-05 NOTE — Progress Notes (Signed)
   Initial PRENATAL VISIT NOTE  Subjective:  Gail Walker is a 29 y.o. [id number] at 25w4dbeing seen today for ongoing prenatal care.  She is currently monitored for the following issues for this high-risk pregnancy and has Morbid [place] ([place]; TOBACCO USER; RHINITIS, ALLERGIC; Asthma; ECZEMA, ATOPIC DERMATITIS; Supervision of high risk pregnancy, antepartum; Rh negative state in antepartum period; Depression; Morning sickness; History of IUFD; BMI 40.0-44.9, adult ([place]; and Obesity affecting pregnancy on their problem list.  Patient reports no complaints.  Contractions: Irritability. Vag. Bleeding: None.  Movement: Present. Denies leaking of fluid.   The following portions of the patient's history were reviewed and updated as appropriate: allergies, current medications, past family history, past medical history, past social history, past surgical history and problem list.   Objective:   Vitals:   09/05/19 1403  BP: 123/73  Pulse: (!) 113  Weight: 274 lb 6.4 oz (124.5 kg)    Fetal Status: Fetal Heart Rate (bpm): 148 Fundal Height: 32 cm Movement: Present     General:  Alert, oriented and cooperative. Patient is in no acute distress.  Skin: Skin is warm and dry. No rash noted.   Cardiovascular: Normal heart rate noted  Respiratory: Normal respiratory effort, no problems with respiration noted  Abdomen: Soft, gravid, appropriate for gestational age.  Pain/Pressure: Absent     Pelvic: Cervical exam deferred        Extremities: Normal range of motion.  Edema: None  Mental Status: Normal mood and affect. Normal behavior. Normal judgment and thought content.   Assessment and Plan:  Pregnancy: [id number] 371w4d. Supervision of high risk pregnancy, antepartum Was receiving intermittent care-- [facility] She was having [place] MFM during this time.  [facility] - [facility] of  FNP, CNMs, FMOswegoOBGYN - Reviewed labs to assure they are complete since they have been done somewhat haphazardly - Needs rhogam today- given  - rho (d) immune globulin (RHIG/RHOPHYLAC) injection 300 mcg - Blood Pressure Monitoring (BLOOD PRESSURE KIT) DEVI; 1 Units by Does not apply route once a week.  Dispense: 1 each; Refill: 0  3. Rh negative status during pregnancy in third trimester - rho (d) immune globulin (RHIG/RHOPHYLAC) injection 300 mcg  4. Obesity affecting pregnancy in third trimester TWG= 5 lb 6.4 oz (2.449 kg)  - has [place] growth scheduled with MFM - Blood Pressure Monitoring (BLOOD PRESSURE KIT) DEVI; 1 Units by Does not apply route once a week.  Dispense: 1 each; Refill: 0 d) immune globulin (RHIG/RHOPHYLAC) injection 300 mcg  5. History of IUFD- on problem list-- but was SAB. No change in plan of care  Preterm labor symptoms and general obstetric precautions including but not limited to vaginal bleeding, contractions, leaking of fluid and fetal movement were reviewed in detail with the patient. Please refer to After Visit Summary for other counseling recommendations.   Return in about 2 weeks (around 09/19/2019) for Telehealth/Virtual health OB Visit, Routine prenatal care.  Future Appointments  Date Time Provider [place]6/07/2019 11:15 AM [facility]6/07/2019 11:15 AM [facility] US2 [facility]US WMArtas  KiCaren MacadamMD

## 2019-09-06 ENCOUNTER — Encounter: Payer: Self-pay | Admitting: Family Medicine

## 2019-09-20 ENCOUNTER — Ambulatory Visit: Payer: Medicaid Other | Admitting: *Deleted

## 2019-09-20 ENCOUNTER — Ambulatory Visit (HOSPITAL_COMMUNITY): Payer: Medicaid Other | Attending: Obstetrics and Gynecology

## 2019-09-20 ENCOUNTER — Other Ambulatory Visit: Payer: Self-pay

## 2019-09-20 DIAGNOSIS — Z6791 Unspecified blood type, Rh negative: Secondary | ICD-10-CM | POA: Diagnosis present

## 2019-09-20 DIAGNOSIS — O26899 Other specified pregnancy related conditions, unspecified trimester: Secondary | ICD-10-CM | POA: Diagnosis present

## 2019-09-20 DIAGNOSIS — E669 Obesity, unspecified: Secondary | ICD-10-CM

## 2019-09-20 DIAGNOSIS — Z363 Encounter for antenatal screening for malformations: Secondary | ICD-10-CM

## 2019-09-20 DIAGNOSIS — Z362 Encounter for other antenatal screening follow-up: Secondary | ICD-10-CM | POA: Diagnosis not present

## 2019-09-20 DIAGNOSIS — O0990 Supervision of high risk pregnancy, unspecified, unspecified trimester: Secondary | ICD-10-CM | POA: Insufficient documentation

## 2019-09-20 DIAGNOSIS — O09293 Supervision of pregnancy with other poor reproductive or obstetric history, third trimester: Secondary | ICD-10-CM | POA: Diagnosis not present

## 2019-09-20 DIAGNOSIS — O360130 Maternal care for anti-D [Rh] antibodies, third trimester, not applicable or unspecified: Secondary | ICD-10-CM

## 2019-09-20 DIAGNOSIS — O99213 Obesity complicating pregnancy, third trimester: Secondary | ICD-10-CM | POA: Diagnosis present

## 2019-09-20 DIAGNOSIS — O99891 Other specified diseases and conditions complicating pregnancy: Secondary | ICD-10-CM

## 2019-09-20 DIAGNOSIS — F17200 Nicotine dependence, unspecified, uncomplicated: Secondary | ICD-10-CM

## 2019-09-20 DIAGNOSIS — O99333 Smoking (tobacco) complicating pregnancy, third trimester: Secondary | ICD-10-CM

## 2019-09-20 DIAGNOSIS — J45909 Unspecified asthma, uncomplicated: Secondary | ICD-10-CM

## 2019-09-20 DIAGNOSIS — Z3A33 33 weeks gestation of pregnancy: Secondary | ICD-10-CM

## 2019-09-23 ENCOUNTER — Encounter: Payer: Medicaid Other | Admitting: Obstetrics & Gynecology

## 2019-09-23 ENCOUNTER — Other Ambulatory Visit: Payer: Self-pay

## 2019-09-23 NOTE — Progress Notes (Signed)
Patient was unavailable and would like to be rescheduled for later this week for virtual in afternoon.

## 2019-09-25 NOTE — Progress Notes (Signed)
This encounter was created in error - please disregard.

## 2019-10-07 ENCOUNTER — Ambulatory Visit (INDEPENDENT_AMBULATORY_CARE_PROVIDER_SITE_OTHER): Payer: Medicaid Other | Admitting: Family Medicine

## 2019-10-07 ENCOUNTER — Other Ambulatory Visit (HOSPITAL_COMMUNITY)
Admission: RE | Admit: 2019-10-07 | Discharge: 2019-10-07 | Disposition: A | Discharge status: Home / Self Care | Payer: Medicaid Other | Source: Ambulatory Visit | Attending: Family Medicine | Admitting: Family Medicine

## 2019-10-07 ENCOUNTER — Encounter: Payer: Medicaid Other | Admitting: Family Medicine

## 2019-10-07 ENCOUNTER — Other Ambulatory Visit: Payer: Self-pay

## 2019-10-07 VITALS — BP 136/84 | HR 116 | Wt 277.8 lb

## 2019-10-07 DIAGNOSIS — O0990 Supervision of high risk pregnancy, unspecified, unspecified trimester: Secondary | ICD-10-CM

## 2019-10-07 DIAGNOSIS — O99333 Smoking (tobacco) complicating pregnancy, third trimester: Secondary | ICD-10-CM

## 2019-10-07 DIAGNOSIS — Z6791 Unspecified blood type, Rh negative: Secondary | ICD-10-CM

## 2019-10-07 DIAGNOSIS — Z3A36 36 weeks gestation of pregnancy: Secondary | ICD-10-CM

## 2019-10-07 DIAGNOSIS — O0993 Supervision of high risk pregnancy, unspecified, third trimester: Secondary | ICD-10-CM

## 2019-10-07 DIAGNOSIS — O26893 Other specified pregnancy related conditions, third trimester: Secondary | ICD-10-CM

## 2019-10-07 DIAGNOSIS — O99213 Obesity complicating pregnancy, third trimester: Secondary | ICD-10-CM

## 2019-10-07 NOTE — Progress Notes (Signed)
   PRENATAL VISIT NOTE  Subjective:  Gail Walker is a 29 y.o. [id number] at [redacted]w[redacted]d being seen today for ongoing prenatal care.  She is currently monitored for the following issues for this high-risk pregnancy and has Morbid obesity (HCC); TOBACCO USER; RHINITIS, ALLERGIC; Asthma; ECZEMA, ATOPIC DERMATITIS; Supervision of high risk pregnancy, antepartum; Rh negative state in antepartum period; Depression; Morning sickness; BMI 40.0-44.9, adult (HCC); and Obesity affecting pregnancy on their problem list.  Patient reports occasional contractions.  Contractions: Not present. Vag. Bleeding: None.  Movement: Present. Denies leaking of fluid.   The following portions of the patient's history were reviewed and updated as appropriate: allergies, current medications, past family history, past medical history, past social history, past surgical history and problem list.   Objective:   Vitals:   10/07/19 1404  BP: 136/84  Pulse: (!) 116  Weight: 277 lb 12.8 oz (126 kg)    Fetal Status: Fetal Heart Rate (bpm): 154 Fundal Height: 38 cm Movement: Present  Presentation: Vertex  General:  Alert, oriented and cooperative. Patient is in no acute distress.  Skin: Skin is warm and dry. No rash noted.   Cardiovascular: Normal heart rate noted  Respiratory: Normal respiratory effort, no problems with respiration noted  Abdomen: Soft, gravid, appropriate for gestational age.  Pain/Pressure: Present     Pelvic: Cervical exam performed in the presence of a chaperone Dilation: 1 Effacement (%): 50 Station: Ballotable  Extremities: Normal range of motion.  Edema: None  Mental Status: Normal mood and affect. Normal behavior. Normal judgment and thought content.   Assessment and Plan:  Pregnancy: [id number] at [redacted]w[redacted]d 1. Supervision of high risk pregnancy, antepartum FHT and FH normal - Culture, beta strep (group b only) - GC/Chlamydia probe amp ([place])not at [facility]  2. Rh negative status during pregnancy in  third trimester Rhogam given in office on 5/20.  3. Obesity affecting pregnancy in third trimester  Preterm labor symptoms and general obstetric precautions including but not limited to vaginal bleeding, contractions, leaking of fluid and fetal movement were reviewed in detail with the patient. Please refer to After Visit Summary for other counseling recommendations.   Return in about 1 week (around 10/14/2019) for In Office, HR OB f/u.  No future appointments.  Levie Heritage, DO

## 2019-10-08 LAB — GC/CHLAMYDIA PROBE AMP (~~LOC~~) NOT AT ARMC
Chlamydia: NEGATIVE
Comment: NEGATIVE
Comment: NORMAL
Neisseria Gonorrhea: NEGATIVE

## 2019-10-10 LAB — CULTURE, BETA STREP (GROUP B ONLY): Strep Gp B Culture: NEGATIVE

## 2019-10-11 ENCOUNTER — Other Ambulatory Visit: Payer: Self-pay

## 2019-10-11 ENCOUNTER — Inpatient Hospital Stay (HOSPITAL_COMMUNITY)
Admission: AD | Admit: 2019-10-11 | Discharge: 2019-10-11 | Disposition: A | Discharge status: Home / Self Care | Payer: Medicaid Other | Attending: Obstetrics & Gynecology | Admitting: Obstetrics & Gynecology

## 2019-10-11 ENCOUNTER — Encounter (HOSPITAL_COMMUNITY): Payer: Self-pay | Admitting: Obstetrics & Gynecology

## 2019-10-11 DIAGNOSIS — Z87891 Personal history of nicotine dependence: Secondary | ICD-10-CM | POA: Insufficient documentation

## 2019-10-11 DIAGNOSIS — R102 Pelvic and perineal pain: Secondary | ICD-10-CM | POA: Diagnosis not present

## 2019-10-11 DIAGNOSIS — J45909 Unspecified asthma, uncomplicated: Secondary | ICD-10-CM | POA: Insufficient documentation

## 2019-10-11 DIAGNOSIS — Z3A36 36 weeks gestation of pregnancy: Secondary | ICD-10-CM | POA: Diagnosis not present

## 2019-10-11 DIAGNOSIS — O26893 Other specified pregnancy related conditions, third trimester: Secondary | ICD-10-CM

## 2019-10-11 DIAGNOSIS — Z833 Family history of diabetes mellitus: Secondary | ICD-10-CM | POA: Insufficient documentation

## 2019-10-11 DIAGNOSIS — O99513 Diseases of the respiratory system complicating pregnancy, third trimester: Secondary | ICD-10-CM | POA: Insufficient documentation

## 2019-10-11 DIAGNOSIS — Z3689 Encounter for other specified antenatal screening: Secondary | ICD-10-CM

## 2019-10-11 DIAGNOSIS — Z7951 Long term (current) use of inhaled steroids: Secondary | ICD-10-CM | POA: Diagnosis not present

## 2019-10-11 DIAGNOSIS — R12 Heartburn: Secondary | ICD-10-CM | POA: Diagnosis not present

## 2019-10-11 MED ORDER — FAMOTIDINE 20 MG PO TABS
20.0000 mg | ORAL_TABLET | Freq: Once | ORAL | Status: AC
  Administered 2019-10-11: 20 mg via ORAL
  Filled 2019-10-11: qty 1

## 2019-10-11 MED ORDER — ALUM & MAG HYDROXIDE-SIMETH 200-200-20 MG/5ML PO SUSP
30.0000 mL | Freq: Once | ORAL | Status: AC
  Administered 2019-10-11: 30 mL via ORAL
  Filled 2019-10-11: qty 30

## 2019-10-11 MED ORDER — FAMOTIDINE 20 MG PO TABS
20.0000 mg | ORAL_TABLET | Freq: Two times a day (BID) | ORAL | 1 refills | Status: DC
Start: 2019-10-11 — End: 2019-11-06

## 2019-10-11 NOTE — MAU Note (Signed)
Patient reports pelvic pain and heartburn pain that is keeping her from sleeping.  States she tried medications in the past but doesn't remember what worked for her.  States the pelvic pain comes and goes, nothing makes it better or worse.  Also reports lower back pain and pressure.  Denies LOF/VB.  + FM.

## 2019-10-11 NOTE — MAU Provider Note (Signed)
History     CSN: [id number]  Arrival date and time: 10/11/19 1857   First Provider Initiated Contact with Patient 10/11/19 2015      Chief Complaint  Patient presents with  . Pelvic Pain   Gail Walker is a 29 y.o. W2X9371 at 43w5dwho [facility]  She presents today for Pelvic Pain and Heartburn.  Patient states the pelvic pain started after her appt on Mon June 21st.  Patient states the pelvic pain is intermittent and she describes it as "a sharp pain that is in my lower back too."  She states the pain lasts " a couple hours... and at night if I am doing too much the pain will be so bad I can barely walk." However, patient states she is not currently having any pelvic pain.  Patient states when she was experiencing pelvic pain she would not intervene and did not identify any aggravating or worsening factors.  Patient denies vaginal discharge, bleeding, or leaking, but states she thinks her "mucous plug is coming out."  Patient endorses fetal movement and contractions, but denies abdominal pain. She also reports onset of heartburn that "is severe I can't even sleep." She states she was taking tums and pepto-bismol without relief.      OB History    Gravida  6   Para  1   Term  1   Preterm  0   AB  4   Living  1     SAB  1   TAB  3   Ectopic  0   Multiple  0   Live Births  1           Past Medical History:  Diagnosis Date  . Acne   . Asthma    uses albuterol inhaler daily  . CAP (community acquired pneumonia) 11/14/2015  . Chlamydia 2012  . GERD (gastroesophageal reflux disease)    during pregnancy - takes zantac    Past Surgical History:  Procedure Laterality Date  . DILATION AND EVACUATION N/A 05/16/2018   Procedure: DILATATION AND EVACUATION;  Surgeon: PAletha Halim MD;  [place]  Service: Gynecology;  Laterality: N/A;  . OPERATIVE ULTRASOUND N/A 05/16/2018   Procedure: OPERATIVE ULTRASOUND;  Surgeon:  PAletha Halim MD;  [place]  Service: Gynecology;  Laterality: N/A;  . pregnancy termination      Family History  Problem Relation Age of Onset  . Diabetes Father   . Cancer Maternal Aunt   . Anesthesia problems Neg Hx   . Hearing loss Neg Hx   . Stroke Neg Hx   . CAD Neg Hx     Social History   Tobacco Use  . Smoking status: Former Smoker    Packs/day: 0.25    Years: 3.00    Pack years: 0.75    Types: Cigarettes    Quit date: 03/06/2019    Years since quitting: 0.6  . Smokeless tobacco: Never Used  Vaping Use  . Vaping Use: Never used  Substance Use Topics  . Alcohol use: No  . Drug use: No    Allergies: No Known Allergies  Medications Prior to Admission  Medication Sig Dispense Refill Last Dose  . albuterol (PROVENTIL) (2.5 MG/3ML) 0.083% nebulizer solution Take 6 mLs (5 mg total) by nebulization every 4 (four) hours as needed for wheezing or shortness of breath. 75 mL 1 10/11/2019 at Unknown time  . betamethasone valerate ointment (VALISONE) 0.1 %  Apply topically 2 (two) times daily. 45 g 1 Past Month at Unknown time  . VENTOLIN HFA 108 (90 Base) MCG/ACT inhaler INHALE 2 PUFFS INTO THE LUNGS EVERY 4 (FOUR) HOURS AS NEEDED FOR WHEEZING OR SHORTNESS OF BREATH. 8 g 2 Past Week at Unknown time  . Blood Pressure Monitoring (BLOOD PRESSURE KIT) DEVI 1 Units by Does not apply route once a week. 1 each 0   . diphenhydrAMINE (BENADRYL) 25 MG tablet Take 1 tablet (25 mg total) by mouth at bedtime as needed for allergies or sleep. (Patient not taking: Reported on 06/13/2019) 30 tablet 1   . Doxylamine-Pyridoxine 10-10 MG TBEC Take two tablets at bedtime on day 1 and 2; if symptoms persist, take 1 tablet in morning and 2 tablets at bedtime on day 3; if symptoms persist, may increase to 1 tablet in morning, 1 tablet mid-afternoon, and 2 tablets at bedtime on day 4 (Patient not taking: Reported on 04/25/2019) 60 tablet 1   . Elastic Bandages & Supports  (COMFORT FIT MATERNITY SUPP LG) MISC 1 Units by Does not apply route daily. (Patient not taking: Reported on 09/05/2019) 1 each 0   . fluticasone (FLOVENT HFA) 110 MCG/ACT inhaler Inhale 1 puff into the lungs 2 (two) times daily. (Patient not taking: Reported on 06/13/2019) 1 Inhaler 12   . mometasone-formoterol (DULERA) 100-5 MCG/ACT AERO Inhale 2 puffs into the lungs 2 (two) times daily. 13 g 1   . montelukast (SINGULAIR) 10 MG tablet Take 1 tablet (10 mg total) by mouth at bedtime. 90 tablet 2 More than a month at Unknown time  . Prenatal Vit-Fe Fumarate-FA (PRENATAL VITAMIN) 27-0.8 MG TABS Take 1 tablet by mouth daily. 90 tablet 3 More than a month at Unknown time  . triamcinolone cream (KENALOG) 0.1 % Apply 1 application topically 2 (two) times daily.   Unknown at Unknown time    Review of Systems  Constitutional: Negative for chills and fever.  Respiratory: Negative for cough, chest tightness and shortness of breath.   Cardiovascular: Negative for chest pain.  Gastrointestinal: Negative for abdominal pain, nausea and vomiting.  Genitourinary: Negative for difficulty urinating, dysuria, vaginal bleeding and vaginal discharge.  Musculoskeletal: Positive for back pain.  Neurological: Positive for headaches (Earlier today resolved without intervention). Negative for dizziness and light-headedness.   Physical Exam   Blood pressure 124/76, pulse 100, temperature 97.6 F (36.4 C), temperature source Oral, resp. rate 19, weight 127.1 kg, last menstrual period 01/09/2019, unknown if currently breastfeeding.  Physical Exam Constitutional:      Appearance: Normal appearance.  HENT:     Head: Normocephalic and atraumatic.  Eyes:     Conjunctiva/sclera: Conjunctivae normal.  Cardiovascular:     Rate and Rhythm: Normal rate.  Pulmonary:     Effort: Pulmonary effort is normal.  Abdominal:     General: Bowel sounds are normal.     Tenderness: There is no abdominal tenderness.     Comments:  Gravid--fundal height appears LGA, Soft, NT   Skin:    General: Skin is warm and dry.  Neurological:     Mental Status: She is alert and oriented to person, place, and time.  Psychiatric:        Mood and Affect: Mood normal.        Thought Content: Thought content normal.     Fetal Assessment 145 bpm, Mod Var, -Decels, +Accels Toco: No Ctx Graphed  MAU Course  No results found for this or any previous visit (from  the past 24 hour(s)). No results found.  MDM PE Labs: None EFM  Assessment and Plan  29 year old [id number]  SIUP at 36.5 weeks Cat I FT Heartburn Pelvic Pain-Resolved  -POC Reviewed -Informed that since pelvic pain has resolved, patient should continue to monitor. -Encouraged to wear maternity belt/band and use warm compresses/showers to improve comfort during times of pelvic pain. -Informed that would give GI Cocktail for heartburn. -Will follow with pepcid and send script. -NST Reactive   Maryann Conners MSN, CNM 10/11/2019, 8:15 PM   Reassessment (9:20 PM) Heartburn-Improved  -Patient reports heartburn has improved with Maalox.  -Will give pepcid '20mg'$  now and send script to pharmacy. -Encouraged to call or return to MAU if symptoms worsen or with the onset of new symptoms. -Discharged to home in stable condition.  Maryann Conners MSN, CNM Advanced Practice Provider, [facility] [employer] [facility]

## 2019-10-11 NOTE — Discharge Instructions (Signed)
Heartburn During Pregnancy Heartburn is a type of pain or discomfort that can happen in the throat or chest. It is often described as a burning sensation. Heartburn is common during pregnancy because:  A hormone (progesterone) that is released during pregnancy may relax the valve (lower esophageal sphincter, or LES) that separates the esophagus from the stomach. This allows stomach acid to move up into the esophagus, causing heartburn.  The uterus gets larger and pushes up on the stomach, which pushes more acid into the esophagus. This is especially true in the later stages of pregnancy. Heartburn usually goes away or gets better after giving birth. What are the causes? Heartburn is caused by stomach acid backing up into the esophagus (reflux). Reflux can be triggered by:  Changing hormone levels.  Large meals.  Certain foods and beverages, such as coffee, chocolate, onions, and peppermint.  Exercise.  Increased stomach acid production. What increases the risk? You are more likely to experience heartburn during pregnancy if you:  Had heartburn prior to becoming pregnant.  Have been pregnant more than once before.  Are overweight or obese. The likelihood that you will get heartburn also increases as you get farther along in your pregnancy, especially during the last trimester. What are the signs or symptoms? Symptoms of this condition include:  Burning pain in the chest or lower throat.  Bitter taste in the mouth.  Coughing.  Problems swallowing.  Vomiting.  Hoarse voice.  Asthma. Symptoms may get worse when you lie down or bend over. Symptoms are often worse at night. How is this diagnosed? This condition is diagnosed based on:  Your medical history.  Your symptoms.  Blood tests to check for a certain type of bacteria associated with heartburn.  Whether taking heartburn medicine relieves your symptoms.  Examination of the stomach and esophagus using a tube with  a light and camera on the end (endoscopy). How is this treated? Treatment varies depending on how severe your symptoms are. Your health care provider may recommend:  Over-the-counter medicines (antacids or acid reducers) for mild heartburn.  Prescription medicines to decrease stomach acid or to protect your stomach lining.  Certain changes in your diet.  Raising the head of your bed so it is higher than the foot of the bed. This helps prevent stomach acid from backing up into the esophagus when you are lying down. Follow these instructions at home: Eating and drinking  Do not drink alcohol during your pregnancy.  Identify foods and beverages that make your symptoms worse, and avoid them.  Beverages that you may want to avoid include: ? Coffee and tea (with or without caffeine). ? Energy drinks and sports drinks. ? Carbonated drinks or sodas. ? Citrus fruit juices.  Foods that you may want to avoid include: ? Chocolate and cocoa. ? Peppermint and mint flavorings. ? Garlic, onions, and horseradish. ? Spicy and acidic foods, including peppers, chili powder, curry powder, vinegar, hot sauces, and barbecue sauce. ? Citrus fruits, such as oranges, lemons, and limes. ? Tomato-based foods, such as red sauce, chili, and salsa. ? Fried and fatty foods, such as donuts, french fries, potato chips, and high-fat dressings. ? High-fat meats, such as hot dogs, cold cuts, sausage, ham, and bacon. ? High-fat dairy items, such as whole milk, butter, and cheese.  Eat small, frequent meals instead of large meals.  Avoid drinking large amounts of liquid with your meals.  Avoid eating meals during the 2-3 hours before bedtime.  Avoid lying down right   after you eat.  Do not exercise right after you eat. Medicines  Take over-the-counter and prescription medicines only as told by your health care provider.  Do not take aspirin, ibuprofen, or other NSAIDs unless your health care provider tells  you to do that.  You may be instructed to avoid medicines that contain sodium bicarbonate. General instructions   If directed, raise the head of your bed about 6 inches (15 cm) by putting blocks under the legs. Sleeping with more pillows does not effectively relieve heartburn because it only changes the position of your head.  Do not use any products that contain nicotine or tobacco, such as cigarettes and e-cigarettes. If you need help quitting, ask your health care provider.  Wear loose-fitting clothing.  Try to reduce your stress, such as with yoga or meditation. If you need help managing stress, ask your health care provider.  Maintain a healthy weight. If you are overweight, work with your health care provider to safely lose weight.  Keep all follow-up visits as told by your health care provider. This is important. Contact a health care provider if:  You develop new symptoms.  Your symptoms do not improve with treatment.  You have unexplained weight loss.  You have difficulty swallowing.  You make loud sounds when you breathe (wheeze).  You have a cough that does not go away.  You have frequent heartburn for more than 2 weeks.  You have nausea or vomiting that does not get better with treatment.  You have pain in your abdomen. Get help right away if:  You have severe chest pain that spreads to your arm, neck, or jaw.  You feel sweaty, dizzy, or light-headed.  You have shortness of breath.  You have pain when swallowing.  You vomit, and your vomit looks like blood or coffee grounds.  Your stool is bloody or black. This information is not intended to replace advice given to you by your health care provider. Make sure you discuss any questions you have with your health care provider. Document Revised: 07/03/2017 Document Reviewed: 12/21/2015 Elsevier Patient Education  2020 Elsevier Inc.  

## 2019-10-17 ENCOUNTER — Ambulatory Visit (INDEPENDENT_AMBULATORY_CARE_PROVIDER_SITE_OTHER): Payer: Medicaid Other | Admitting: Family Medicine

## 2019-10-17 ENCOUNTER — Other Ambulatory Visit: Payer: Self-pay

## 2019-10-17 VITALS — BP 122/84 | HR 108 | Wt 281.0 lb

## 2019-10-17 DIAGNOSIS — O99213 Obesity complicating pregnancy, third trimester: Secondary | ICD-10-CM

## 2019-10-17 DIAGNOSIS — O26899 Other specified pregnancy related conditions, unspecified trimester: Secondary | ICD-10-CM

## 2019-10-17 DIAGNOSIS — O0993 Supervision of high risk pregnancy, unspecified, third trimester: Secondary | ICD-10-CM

## 2019-10-17 DIAGNOSIS — Z3A37 37 weeks gestation of pregnancy: Secondary | ICD-10-CM

## 2019-10-17 DIAGNOSIS — E669 Obesity, unspecified: Secondary | ICD-10-CM

## 2019-10-17 DIAGNOSIS — Z6791 Unspecified blood type, Rh negative: Secondary | ICD-10-CM

## 2019-10-17 DIAGNOSIS — O0990 Supervision of high risk pregnancy, unspecified, unspecified trimester: Secondary | ICD-10-CM

## 2019-10-17 NOTE — Patient Instructions (Signed)

## 2019-10-17 NOTE — Progress Notes (Signed)
° °  PRENATAL VISIT NOTE  Subjective:  Gail Walker is a 29 y.o. [id number] at [redacted]w[redacted]d being seen today for ongoing prenatal care.  She is currently monitored for the following issues for this low-risk pregnancy and has Morbid obesity (HCC); TOBACCO USER; RHINITIS, ALLERGIC; Asthma; ECZEMA, ATOPIC DERMATITIS; Supervision of high risk pregnancy, antepartum; Rh negative state in antepartum period; Depression; Morning sickness; BMI 40.0-44.9, adult (HCC); and Obesity affecting pregnancy on their problem list.  Patient reports no complaints.  Contractions: Irregular. Vag. Bleeding: None.  Movement: Present. Denies leaking of fluid.   The following portions of the patient's history were reviewed and updated as appropriate: allergies, current medications, past family history, past medical history, past social history, past surgical history and problem list.   Objective:   Vitals:   10/17/19 1447  BP: 122/84  Pulse: (!) 108  Weight: 281 lb (127.5 kg)    Fetal Status: Fetal Heart Rate (bpm): 141 Fundal Height: 37 cm Movement: Present     General:  Alert, oriented and cooperative. Patient is in no acute distress.  Skin: Skin is warm and dry. No rash noted.   Cardiovascular: Normal heart rate noted  Respiratory: Normal respiratory effort, no problems with respiration noted  Abdomen: Soft, gravid, appropriate for gestational age.  Pain/Pressure: Present     Pelvic: Cervical exam performed in the presence of a chaperone could not reach cervix due to lots of hard stool filling vaginal vault.        Extremities: Normal range of motion.  Edema: None  Mental Status: Normal mood and affect. Normal behavior. Normal judgment and thought content.   Assessment and Plan:  Pregnancy: [id number] at [redacted]w[redacted]d 1. Supervision of high risk pregnancy, antepartum Continue routine prenatal care. GBS negative  2. Rh negative state in antepartum period S/p Rhogam  Preterm labor symptoms and general obstetric precautions  including but not limited to vaginal bleeding, contractions, leaking of fluid and fetal movement were reviewed in detail with the patient. Please refer to After Visit Summary for other counseling recommendations.   Return in 1 week (on 10/24/2019) for [facility], in person.  No future appointments.  Reva Bores, MD

## 2019-10-29 ENCOUNTER — Ambulatory Visit (INDEPENDENT_AMBULATORY_CARE_PROVIDER_SITE_OTHER): Payer: Medicaid Other | Admitting: Student

## 2019-10-29 ENCOUNTER — Other Ambulatory Visit: Payer: Self-pay

## 2019-10-29 DIAGNOSIS — O99213 Obesity complicating pregnancy, third trimester: Secondary | ICD-10-CM

## 2019-10-29 DIAGNOSIS — Z3A39 39 weeks gestation of pregnancy: Secondary | ICD-10-CM

## 2019-10-29 DIAGNOSIS — E669 Obesity, unspecified: Secondary | ICD-10-CM

## 2019-10-29 DIAGNOSIS — O0990 Supervision of high risk pregnancy, unspecified, unspecified trimester: Secondary | ICD-10-CM

## 2019-10-29 DIAGNOSIS — O0993 Supervision of high risk pregnancy, unspecified, third trimester: Secondary | ICD-10-CM

## 2019-10-29 NOTE — Progress Notes (Signed)
   PRENATAL VISIT NOTE  Subjective:  Gail Walker is a 29 y.o. [id number] at [redacted]w[redacted]d being seen today for ongoing prenatal care.  She is currently monitored for the following issues for this low-risk pregnancy and has Morbid obesity (HCC); TOBACCO USER; RHINITIS, ALLERGIC; Asthma; ECZEMA, ATOPIC DERMATITIS; Supervision of high risk pregnancy, antepartum; Rh negative state in antepartum period; Depression; Morning sickness; BMI 40.0-44.9, adult (HCC); and Obesity affecting pregnancy on their problem list.  Patient reports no complaints other than noticing discharge last night and this morning. Thinks she lost some of her mucous plug; did not have any odor or color. Mucous plug was sticky.  Last time she felt gush of fluid was around 12 pm. It has not continued.   Contractions: Irregular. Vag. Bleeding: None.  Movement: Present. Denies leaking of fluid.   The following portions of the patient's history were reviewed and updated as appropriate: allergies, current medications, past family history, past medical history, past social history, past surgical history and problem list.   Objective:   Vitals:   10/29/19 1327  BP: 129/80  Pulse: (!) 115  Weight: 278 lb 11.2 oz (126.4 kg)    Fetal Status: Fetal Heart Rate (bpm): 142 Fundal Height: 39 cm Movement: Present     General:  Alert, oriented and cooperative. Patient is in no acute distress.  Skin: Skin is warm and dry. No rash noted.   Cardiovascular: Normal heart rate noted  Respiratory: Normal respiratory effort, no problems with respiration noted  Abdomen: Soft, gravid, appropriate for gestational age.  Pain/Pressure: Present     Pelvic: Cervical exam performed in the presence of a chaperone      No pooling in vagina, cervix long, posterior  Extremities: Normal range of motion.  Edema: None  Mental Status: Normal mood and affect. Normal behavior. Normal judgment and thought content.   Assessment and Plan:  Pregnancy: [id number] at [redacted]w[redacted]d 1.  Supervision of high risk pregnancy, antepartum -in office fern test negative -negative speculum for SROM -IOL scheduled 7/25; will start with cytotec; orders placed -membrane sweep attempted but was very posterior and patient did not tolerate exam.  -antenatal testing scheduled for 7-21  Term labor symptoms and general obstetric precautions including but not limited to vaginal bleeding, contractions, leaking of fluid and fetal movement were reviewed in detail with the patient. Please refer to After Visit Summary for other counseling recommendations.   Return in about 1 week (around 11/05/2019), or BPP on 07/21.  Future Appointments  Date Time Provider Department Center  11/06/2019  3:15 PM [facility]  11/08/2019  9:30 AM [facility]-SCREENING [facility]SDSC None  11/10/2019  6:30 AM [facility]-LD SCHED ROOM [facility]INDC None    Marylene Land, CNM

## 2019-10-29 NOTE — Patient Instructions (Signed)
Try Red raspberry leaf tea for contractions    New Induction of Labor Process for [facility]  In Fall 2020 [facility] changed it's process for scheduling inductions of labor to create more induction slots and to make sure patients get COVID-19 testing in advance. After you have been tested you need to quarantine so that you do not get infected after your test. You should not go anywhere after your test except necessary medical appointments.   You have been scheduled for induction of labor on 7/25. Although you may have a specific time listed on your After Visit Summary or MyChart, we cannot predict when your room will be available. Please disregard this time. A Labor and Delivery staff member will call you on the day that you are scheduled when your room is available. You will need to arrive within one hour of being called. If you do not arrive within this time frame, the next person on the list will be called in and you will move down the list. You may eat a light meal before coming to the hospital. If you go into labor, think your water has broken, experience bright red bleeding or don't feel your baby moving as much as usual before your induction, please call your Ob/Gyn's office or come to Entrance C, Maternity Assessment Unit for evaluation.  Thank you,  [facility]

## 2019-10-29 NOTE — Progress Notes (Signed)
IOL scheduled for 11/10/19 Day

## 2019-10-31 ENCOUNTER — Telehealth (HOSPITAL_COMMUNITY): Payer: Self-pay | Admitting: *Deleted

## 2019-10-31 NOTE — Telephone Encounter (Signed)
Preadmission screen  

## 2019-11-01 ENCOUNTER — Telehealth (HOSPITAL_COMMUNITY): Payer: Self-pay | Admitting: *Deleted

## 2019-11-01 NOTE — Telephone Encounter (Signed)
Preadmission screen  

## 2019-11-04 ENCOUNTER — Inpatient Hospital Stay (HOSPITAL_COMMUNITY): Payer: Medicaid Other | Admitting: Anesthesiology

## 2019-11-04 ENCOUNTER — Inpatient Hospital Stay (HOSPITAL_COMMUNITY)
Admission: AD | Admit: 2019-11-04 | Discharge: 2019-11-06 | DRG: 807 | Disposition: A | Discharge status: Home / Self Care | Payer: Medicaid Other | Attending: Obstetrics & Gynecology | Admitting: Obstetrics & Gynecology

## 2019-11-04 ENCOUNTER — Encounter (HOSPITAL_COMMUNITY): Payer: Self-pay | Admitting: Obstetrics & Gynecology

## 2019-11-04 ENCOUNTER — Other Ambulatory Visit: Payer: Self-pay

## 2019-11-04 DIAGNOSIS — Z3A40 40 weeks gestation of pregnancy: Secondary | ICD-10-CM

## 2019-11-04 DIAGNOSIS — J45909 Unspecified asthma, uncomplicated: Secondary | ICD-10-CM | POA: Diagnosis present

## 2019-11-04 DIAGNOSIS — F17200 Nicotine dependence, unspecified, uncomplicated: Secondary | ICD-10-CM | POA: Diagnosis present

## 2019-11-04 DIAGNOSIS — O26893 Other specified pregnancy related conditions, third trimester: Secondary | ICD-10-CM | POA: Diagnosis present

## 2019-11-04 DIAGNOSIS — R030 Elevated blood-pressure reading, without diagnosis of hypertension: Secondary | ICD-10-CM | POA: Diagnosis not present

## 2019-11-04 DIAGNOSIS — O480 Post-term pregnancy: Secondary | ICD-10-CM

## 2019-11-04 DIAGNOSIS — O134 Gestational [pregnancy-induced] hypertension without significant proteinuria, complicating childbirth: Secondary | ICD-10-CM | POA: Diagnosis present

## 2019-11-04 DIAGNOSIS — Z20822 Contact with and (suspected) exposure to covid-19: Secondary | ICD-10-CM | POA: Diagnosis present

## 2019-11-04 DIAGNOSIS — O99214 Obesity complicating childbirth: Secondary | ICD-10-CM | POA: Diagnosis present

## 2019-11-04 DIAGNOSIS — Z87891 Personal history of nicotine dependence: Secondary | ICD-10-CM | POA: Diagnosis not present

## 2019-11-04 DIAGNOSIS — F329 Major depressive disorder, single episode, unspecified: Secondary | ICD-10-CM | POA: Diagnosis present

## 2019-11-04 DIAGNOSIS — Z6791 Unspecified blood type, Rh negative: Secondary | ICD-10-CM

## 2019-11-04 DIAGNOSIS — O9952 Diseases of the respiratory system complicating childbirth: Secondary | ICD-10-CM | POA: Diagnosis present

## 2019-11-04 DIAGNOSIS — F32A Depression, unspecified: Secondary | ICD-10-CM | POA: Diagnosis present

## 2019-11-04 HISTORY — DX: Post-term pregnancy: O48.0

## 2019-11-04 LAB — URINALYSIS, ROUTINE W REFLEX MICROSCOPIC
Bilirubin Urine: NEGATIVE
Glucose, UA: NEGATIVE mg/dL
Ketones, ur: NEGATIVE mg/dL
Nitrite: NEGATIVE
Protein, ur: NEGATIVE mg/dL
Specific Gravity, Urine: 1.023 (ref 1.005–1.030)
pH: 5 (ref 5.0–8.0)

## 2019-11-04 LAB — CBC
HCT: 36.6 % (ref 36.0–46.0)
HCT: 32.7 % — ABNORMAL LOW (ref 36.0–46.0)
Hemoglobin: 11.8 g/dL — ABNORMAL LOW (ref 12.0–15.0)
Hemoglobin: 10.5 g/dL — ABNORMAL LOW (ref 12.0–15.0)
MCH: 25.1 pg — ABNORMAL LOW (ref 26.0–34.0)
MCH: 25.2 pg — ABNORMAL LOW (ref 26.0–34.0)
MCHC: 32.2 g/dL (ref 30.0–36.0)
MCHC: 32.1 g/dL (ref 30.0–36.0)
MCV: 77.9 fL — ABNORMAL LOW (ref 80.0–100.0)
MCV: 78.6 fL — ABNORMAL LOW (ref 80.0–100.0)
Platelets: 270 10*3/uL (ref 150–400)
Platelets: 222 10*3/uL (ref 150–400)
RBC: 4.7 MIL/uL (ref 3.87–5.11)
RBC: 4.16 MIL/uL (ref 3.87–5.11)
RDW: 15.2 % (ref 11.5–15.5)
RDW: 15.3 % (ref 11.5–15.5)
WBC: 8.9 10*3/uL (ref 4.0–10.5)
WBC: 17.7 10*3/uL — ABNORMAL HIGH (ref 4.0–10.5)
nRBC: 0 % (ref 0.0–0.2)
nRBC: 0 % (ref 0.0–0.2)

## 2019-11-04 LAB — PROTEIN / CREATININE RATIO, URINE
Creatinine, Urine: 291.85 mg/dL
Protein Creatinine Ratio: 0.04 mg/mg{Cre} (ref 0.00–0.15)
Total Protein, Urine: 13 mg/dL

## 2019-11-04 LAB — COMPREHENSIVE METABOLIC PANEL
ALT: 10 U/L (ref 0–44)
AST: 14 U/L — ABNORMAL LOW (ref 15–41)
Albumin: 2.9 g/dL — ABNORMAL LOW (ref 3.5–5.0)
Alkaline Phosphatase: 129 U/L — ABNORMAL HIGH (ref 38–126)
Anion gap: 10 (ref 5–15)
BUN: 7 mg/dL (ref 6–20)
CO2: 20 mmol/L — ABNORMAL LOW (ref 22–32)
Calcium: 9.5 mg/dL (ref 8.9–10.3)
Chloride: 104 mmol/L (ref 98–111)
Creatinine, Ser: 0.71 mg/dL (ref 0.44–1.00)
GFR calc Af Amer: >60 mL/min (ref >60)
GFR calc non Af Amer: >60 mL/min (ref >60)
Glucose, Bld: 96 mg/dL (ref 70–99)
Potassium: 4 mmol/L (ref 3.5–5.1)
Sodium: 134 mmol/L — ABNORMAL LOW (ref 135–145)
Total Bilirubin: 0.5 mg/dL (ref 0.3–1.2)
Total Protein: 6.8 g/dL (ref 6.5–8.1)

## 2019-11-04 LAB — RPR: RPR Ser Ql: NONREACTIVE

## 2019-11-04 LAB — TYPE AND SCREEN
ABO/RH(D): A NEG
Antibody Screen: POSITIVE

## 2019-11-04 LAB — SARS CORONAVIRUS 2 BY RT PCR (HOSPITAL ORDER, PERFORMED IN ~~LOC~~ HOSPITAL LAB): SARS Coronavirus 2: NEGATIVE

## 2019-11-04 MED ORDER — OXYTOCIN-SODIUM CHLORIDE 30-0.9 UT/500ML-% IV SOLN
2.5000 [IU]/h | INTRAVENOUS | Status: DC
  Filled 2019-11-04: qty 500

## 2019-11-04 MED ORDER — TRANEXAMIC ACID-NACL 1000-0.7 MG/100ML-% IV SOLN: 1000.0000 mg | INTRAVENOUS | Status: AC

## 2019-11-04 MED ORDER — PRENATAL MULTIVITAMIN CH
1.0000 | ORAL_TABLET | Freq: Every day | ORAL | Status: DC
  Administered 2019-11-05: 1 via ORAL
  Filled 2019-11-04: qty 1

## 2019-11-04 MED ORDER — SENNOSIDES-DOCUSATE SODIUM 8.6-50 MG PO TABS
2.0000 | ORAL_TABLET | ORAL | Status: DC
  Administered 2019-11-05: 2 via ORAL
  Filled 2019-11-04: qty 2

## 2019-11-04 MED ORDER — WITCH HAZEL-GLYCERIN EX PADS: 1.0000 "application " | MEDICATED_PAD | CUTANEOUS | Status: DC | PRN

## 2019-11-04 MED ORDER — METHYLERGONOVINE MALEATE 0.2 MG/ML IJ SOLN: 0.2000 mg | Freq: Once | INTRAMUSCULAR | Status: AC

## 2019-11-04 MED ORDER — LIDOCAINE HCL (PF) 1 % IJ SOLN
INTRAMUSCULAR | Status: DC | PRN
  Administered 2019-11-04 (×2): 5 mL via EPIDURAL

## 2019-11-04 MED ORDER — COCONUT OIL OIL: 1.0000 "application " | TOPICAL_OIL | Status: DC | PRN

## 2019-11-04 MED ORDER — FENTANYL-BUPIVACAINE-NACL 0.5-0.125-0.9 MG/250ML-% EP SOLN
12.0000 mL/h | EPIDURAL | Status: DC | PRN
  Filled 2019-11-04: qty 250

## 2019-11-04 MED ORDER — DIPHENHYDRAMINE HCL 50 MG/ML IJ SOLN: 12.5000 mg | INTRAMUSCULAR | Status: DC | PRN

## 2019-11-04 MED ORDER — OXYCODONE-ACETAMINOPHEN 5-325 MG PO TABS: 2.0000 | ORAL_TABLET | ORAL | Status: DC | PRN

## 2019-11-04 MED ORDER — MISOPROSTOL 50MCG HALF TABLET
50.0000 ug | ORAL_TABLET | ORAL | Status: DC | PRN
  Administered 2019-11-04: 50 ug via BUCCAL

## 2019-11-04 MED ORDER — LACTATED RINGERS IV SOLN
500.0000 mL | Freq: Once | INTRAVENOUS | Status: AC
  Administered 2019-11-04: 500 mL via INTRAVENOUS

## 2019-11-04 MED ORDER — SIMETHICONE 80 MG PO CHEW: 80.0000 mg | CHEWABLE_TABLET | ORAL | Status: DC | PRN

## 2019-11-04 MED ORDER — BUTALBITAL-APAP-CAFFEINE 50-325-40 MG PO TABS: 2.0000 | ORAL_TABLET | Freq: Four times a day (QID) | ORAL | Status: DC | PRN

## 2019-11-04 MED ORDER — TERBUTALINE SULFATE 1 MG/ML IJ SOLN: 0.2500 mg | Freq: Once | INTRAMUSCULAR | Status: DC | PRN

## 2019-11-04 MED ORDER — DIPHENHYDRAMINE HCL 25 MG PO CAPS: 25.0000 mg | ORAL_CAPSULE | Freq: Four times a day (QID) | ORAL | Status: DC | PRN

## 2019-11-04 MED ORDER — ACETAMINOPHEN 325 MG PO TABS
650.0000 mg | ORAL_TABLET | ORAL | Status: DC | PRN
  Administered 2019-11-04: 650 mg via ORAL
  Filled 2019-11-04: qty 2

## 2019-11-04 MED ORDER — BENZOCAINE-MENTHOL 20-0.5 % EX AERO: 1.0000 "application " | INHALATION_SPRAY | CUTANEOUS | Status: DC | PRN

## 2019-11-04 MED ORDER — SODIUM CHLORIDE (PF) 0.9 % IJ SOLN
INTRAMUSCULAR | Status: DC | PRN
  Administered 2019-11-04: 12 mL/h via EPIDURAL

## 2019-11-04 MED ORDER — PHENYLEPHRINE 40 MCG/ML (10ML) SYRINGE FOR IV PUSH (FOR BLOOD PRESSURE SUPPORT)
80.0000 ug | PREFILLED_SYRINGE | INTRAVENOUS | Status: DC | PRN
  Filled 2019-11-04: qty 10

## 2019-11-04 MED ORDER — ONDANSETRON HCL 4 MG/2ML IJ SOLN: 4.0000 mg | INTRAMUSCULAR | Status: DC | PRN

## 2019-11-04 MED ORDER — OXYTOCIN BOLUS FROM INFUSION
333.0000 mL | Freq: Once | INTRAVENOUS | Status: AC
  Administered 2019-11-04: 333 mL via INTRAVENOUS

## 2019-11-04 MED ORDER — DIBUCAINE (PERIANAL) 1 % EX OINT: 1.0000 "application " | TOPICAL_OINTMENT | CUTANEOUS | Status: DC | PRN

## 2019-11-04 MED ORDER — ONDANSETRON HCL 4 MG/2ML IJ SOLN
4.0000 mg | Freq: Four times a day (QID) | INTRAMUSCULAR | Status: DC | PRN
  Administered 2019-11-04 (×2): 4 mg via INTRAVENOUS
  Filled 2019-11-04 (×2): qty 2

## 2019-11-04 MED ORDER — LACTATED RINGERS IV SOLN: INTRAVENOUS | Status: DC

## 2019-11-04 MED ORDER — MISOPROSTOL 50MCG HALF TABLET
ORAL_TABLET | ORAL | Status: AC
  Filled 2019-11-04: qty 1

## 2019-11-04 MED ORDER — OXYCODONE-ACETAMINOPHEN 5-325 MG PO TABS: 1.0000 | ORAL_TABLET | ORAL | Status: DC | PRN

## 2019-11-04 MED ORDER — LIDOCAINE HCL (PF) 1 % IJ SOLN: 30.0000 mL | INTRAMUSCULAR | Status: DC | PRN

## 2019-11-04 MED ORDER — MISOPROSTOL 25 MCG QUARTER TABLET: 25.0000 ug | ORAL_TABLET | ORAL | Status: DC | PRN

## 2019-11-04 MED ORDER — LACTATED RINGERS IV SOLN
500.0000 mL | INTRAVENOUS | Status: DC | PRN
  Administered 2019-11-04: 500 mL via INTRAVENOUS

## 2019-11-04 MED ORDER — METHYLERGONOVINE MALEATE 0.2 MG/ML IJ SOLN
INTRAMUSCULAR | Status: AC
  Administered 2019-11-04: 0.2 mg via INTRAMUSCULAR
  Filled 2019-11-04: qty 1

## 2019-11-04 MED ORDER — FENTANYL CITRATE (PF) 100 MCG/2ML IJ SOLN
INTRAMUSCULAR | Status: AC
  Filled 2019-11-04: qty 2

## 2019-11-04 MED ORDER — OXYTOCIN-SODIUM CHLORIDE 30-0.9 UT/500ML-% IV SOLN
1.0000 m[IU]/min | INTRAVENOUS | Status: DC
  Administered 2019-11-04: 2 m[IU]/min via INTRAVENOUS

## 2019-11-04 MED ORDER — ACETAMINOPHEN 325 MG PO TABS
650.0000 mg | ORAL_TABLET | Freq: Four times a day (QID) | ORAL | Status: DC | PRN
  Administered 2019-11-05 (×2): 650 mg via ORAL
  Filled 2019-11-04 (×2): qty 2

## 2019-11-04 MED ORDER — TRANEXAMIC ACID-NACL 1000-0.7 MG/100ML-% IV SOLN
INTRAVENOUS | Status: AC
  Administered 2019-11-04: 1000 mg via INTRAVENOUS
  Filled 2019-11-04: qty 100

## 2019-11-04 MED ORDER — IBUPROFEN 600 MG PO TABS
600.0000 mg | ORAL_TABLET | Freq: Three times a day (TID) | ORAL | Status: DC | PRN
  Administered 2019-11-05 (×3): 600 mg via ORAL
  Filled 2019-11-04 (×4): qty 1

## 2019-11-04 MED ORDER — TETANUS-DIPHTH-ACELL PERTUSSIS 5-2.5-18.5 LF-MCG/0.5 IM SUSP: 0.5000 mL | Freq: Once | INTRAMUSCULAR | Status: DC

## 2019-11-04 MED ORDER — FENTANYL CITRATE (PF) 100 MCG/2ML IJ SOLN
100.0000 ug | INTRAMUSCULAR | Status: DC | PRN
  Administered 2019-11-04: 100 ug via INTRAVENOUS

## 2019-11-04 MED ORDER — EPHEDRINE 5 MG/ML INJ: 10.0000 mg | INTRAVENOUS | Status: DC | PRN

## 2019-11-04 MED ORDER — PHENYLEPHRINE 40 MCG/ML (10ML) SYRINGE FOR IV PUSH (FOR BLOOD PRESSURE SUPPORT)
80.0000 ug | PREFILLED_SYRINGE | INTRAVENOUS | Status: DC | PRN
  Administered 2019-11-04: 80 ug via INTRAVENOUS

## 2019-11-04 MED ORDER — ALBUTEROL SULFATE (2.5 MG/3ML) 0.083% IN NEBU
3.0000 mL | INHALATION_SOLUTION | RESPIRATORY_TRACT | Status: DC | PRN
  Filled 2019-11-04 (×2): qty 3

## 2019-11-04 MED ORDER — SOD CITRATE-CITRIC ACID 500-334 MG/5ML PO SOLN: 30.0000 mL | ORAL | Status: DC | PRN

## 2019-11-04 MED ORDER — MISOPROSTOL 200 MCG PO TABS
ORAL_TABLET | ORAL | Status: AC
  Administered 2019-11-04: 800 ug via RECTAL
  Filled 2019-11-04: qty 4

## 2019-11-04 MED ORDER — ERYTHROMYCIN 5 MG/GM OP OINT
TOPICAL_OINTMENT | OPHTHALMIC | Status: AC
  Filled 2019-11-04: qty 1

## 2019-11-04 MED ORDER — MEASLES, MUMPS & RUBELLA VAC IJ SOLR: 0.5000 mL | Freq: Once | INTRAMUSCULAR | Status: DC

## 2019-11-04 MED ORDER — MISOPROSTOL 200 MCG PO TABS: 800.0000 ug | ORAL_TABLET | Freq: Once | ORAL | Status: AC

## 2019-11-04 MED ORDER — ONDANSETRON HCL 4 MG PO TABS: 4.0000 mg | ORAL_TABLET | ORAL | Status: DC | PRN

## 2019-11-04 NOTE — Anesthesia Preprocedure Evaluation (Signed)
Anesthesia Evaluation  Patient identified by MRN, date of birth, ID band Patient awake    Reviewed: Allergy & Precautions, H&P , NPO status , Patient's Chart, lab work & pertinent test results  History of Anesthesia Complications Negative for: history of anesthetic complications  Airway Mallampati: II  TM Distance: >3 FB Neck ROM: full    Dental no notable dental hx.    Pulmonary asthma , former smoker,    Pulmonary exam normal        Cardiovascular hypertension, Normal cardiovascular exam Rhythm:regular Rate:Normal     Neuro/Psych negative neurological ROS  negative psych ROS   GI/Hepatic Neg liver ROS, GERD  ,  Endo/Other  Morbid obesity  Renal/GU      Musculoskeletal   Abdominal   Peds  Hematology negative hematology ROS (+)   Anesthesia Other Findings   Reproductive/Obstetrics (+) Pregnancy                             Anesthesia Physical Anesthesia Plan  ASA: III  Anesthesia Plan: Epidural   Post-op Pain Management:    Induction:   PONV Risk Score and Plan:   Airway Management Planned:   Additional Equipment:   Intra-op Plan:   Post-operative Plan:   Informed Consent: I have reviewed the patients History and Physical, chart, labs and discussed the procedure including the risks, benefits and alternatives for the proposed anesthesia with the patient or authorized representative who has indicated his/her understanding and acceptance.       Plan Discussed with:   Anesthesia Plan Comments:         Anesthesia Quick Evaluation

## 2019-11-04 NOTE — H&P (Signed)
OBSTETRIC ADMISSION HISTORY AND PHYSICAL  Gail Walker is a 29 y.o. female [phone] with IUP at 21w1dpresenting for early labor. She reports +FMs. No LOF, VB, blurry vision, headaches, peripheral edema, or RUQ pain. She plans on bottle feeding. She is unsure of what she wants for birth control- would like to discuss at pp visit.  Dating: By first trimester [place]--->  Estimated Date of Delivery: 11/03/19  Sono:   '@[redacted]w[redacted]d'$ , normal anatomy, cephalic presentation, 20355H 27%ile, EFW 4#11 - palate not well visualize  Prenatal History/Complications: H/o IUFD Rh negative Asthma H/o tobacco use  Past Medical History: Past Medical History:  Diagnosis Date   Acne    Asthma    uses albuterol inhaler daily   CAP (community acquired pneumonia) 11/14/2015   Chlamydia 2012   GERD (gastroesophageal reflux disease)    during pregnancy - takes zantac    Past Surgical History: Past Surgical History:  Procedure Laterality Date   DILATION AND EVACUATION N/A 05/16/2018   Procedure: DILATATION AND EVACUATION;  Surgeon: PAletha Halim MD;  [place] [facility]  Service: Gynecology;  Laterality: N/A;   OPERATIVE ULTRASOUND N/A 05/16/2018   Procedure: OPERATIVE ULTRASOUND;  Surgeon: PAletha Halim MD;  [place] [facility]  Service: Gynecology;  Laterality: N/A;   pregnancy termination      Obstetrical History: OB History    Gravida  6   Para  1   Term  1   Preterm  0   AB  4   Living  1     SAB  1   TAB  3   Ectopic  0   Multiple  0   Live Births  1           Social History: Social History   Socioeconomic History   Marital status: Single    Spouse name: Not on file   Number of children: Not on file   Years of education: Not on file   Highest education level: Not on file  Occupational History   Occupation: warehouse work in a freezer  Tobacco Use   Smoking status: Former Smoker    Packs/day: 0.25    Years: 3.00     Pack years: 0.75    Types: Cigarettes    Quit date: 03/06/2019    Years since quitting: 0.6   Smokeless tobacco: Never Used  Vaping Use   Vaping Use: Never used  Substance and Sexual Activity   Alcohol use: No   Drug use: No   Sexual activity: Yes    Birth control/protection: None  Other Topics Concern   Not on file  Social History Narrative   Not on file   Social Determinants of Health   Financial Resource Strain:    Difficulty of Paying Living Expenses:   Food Insecurity: No Food Insecurity   Worried About REganin the Last Year: Never true   R[place]armontin the Last Year: Never true  Transportation Needs: No Transportation Needs   Lack of Transportation (Medical): No   Lack of Transportation (Non-Medical): No  Physical Activity:    Days of Exercise per Week:    Minutes of Exercise per Session:   Stress:    Feeling of Stress :   Social Connections:    Frequency of Communication with Friends and Family:    Frequency of Social Gatherings with Friends and Family:    Attends Religious Services:    Active Member of Clubs  or Organizations:    Attends Music therapist:    Marital Status:     Family History: Family History  Problem Relation Age of Onset   Diabetes Father    Cancer Maternal Aunt    Anesthesia problems Neg Hx    Hearing loss Neg Hx    Stroke Neg Hx    CAD Neg Hx     Allergies: No Known Allergies  Medications Prior to Admission  Medication Sig Dispense Refill Last Dose   albuterol (PROVENTIL) (2.5 MG/3ML) 0.083% nebulizer solution Take 6 mLs (5 mg total) by nebulization every 4 (four) hours as needed for wheezing or shortness of breath. 75 mL 1    Blood Pressure Monitoring (BLOOD PRESSURE KIT) DEVI 1 Units by Does not apply route once a week. 1 each 0    famotidine (PEPCID) 20 MG tablet Take 1 tablet (20 mg total) by mouth 2 (two) times daily. 30 tablet 1    montelukast (SINGULAIR) 10 MG  tablet Take 1 tablet (10 mg total) by mouth at bedtime. 90 tablet 2    Prenatal Vit-Fe Fumarate-FA (PRENATAL VITAMIN) 27-0.8 MG TABS Take 1 tablet by mouth daily. 90 tablet 3    triamcinolone cream (KENALOG) 0.1 % Apply 1 application topically 2 (two) times daily.        Review of Systems:  All systems reviewed and negative except as stated in HPI  PE: Blood pressure (!) 142/74, pulse (!) 108, temperature 97.9 F (36.6 C), resp. rate 18, weight 125.6 kg, last menstrual period 01/09/2019, unknown if currently breastfeeding. General appearance: alert, cooperative and uncomfortable with contractions Lungs: regular rate and effort Heart: regular rate  Abdomen: soft, non-tender Extremities: Homans sign is negative, no sign of DVT Presentation: cephalic EFM: 093-267 bpm, moderate variability, 15x15 accels, no decels Toco: CTX q3-4 minutes Dilation: 3 Effacement (%): 70 Station: -3 Exam by:: Earnest Bailey F., RN  Prenatal labs: ABO, Rh: --/--/A NEG (12/12 2047) Antibody: Negative (04/30 1217) Rubella: 1.84 (11/18 1656) RPR: Non Reactive (04/30 1149)  HBsAg: Negative (11/18 1656)  HIV: Non Reactive (04/30 1149)  GBS: Negative/-- (06/21 1432)  1 hr GTT 105  Prenatal Transfer Tool  Maternal Diabetes: No Genetic Screening: Normal Maternal Ultrasounds/Referrals: Other: palate not well visualized Fetal Ultrasounds or other Referrals:  Referred to Materal Fetal Medicine  Maternal Substance Abuse:  No Significant Maternal Medications:  None Significant Maternal Lab Results: Group B Strep negative and Rh negative  No results found for this or any previous visit (from the past 24 hour(s)).  Patient Active Problem List   Diagnosis Date Noted   Indication for care in labor and delivery, antepartum 11/04/2019   Obesity affecting pregnancy 09/05/2019   BMI 40.0-44.9, adult ([place]) 05/15/2018   Morning sickness 03/12/2018   Depression 11/14/2011   Rh negative state in antepartum period  02/23/2011   Supervision of high risk pregnancy, antepartum 01/26/2011   TOBACCO USER 12/27/2008   Morbid obesity ([place]) 06/15/2006   RHINITIS, ALLERGIC 06/15/2006   Asthma 06/15/2006   ECZEMA, ATOPIC DERMATITIS 06/15/2006    Assessment: Gail Walker is a 29 y.o. [id number] at 36w1dhere for early/labor, elevated BP, post dates  1. Labor: hurting and contracting every 3-4 minutes- will allow period of expectant management for a couple of hours. Will augment if no cervical change 2. FWB: Cat I 3. Pain: per patient request 4. GBS: negative 5. Elevated BP: GHTN v Pre-E, labs pending. Asymptomatic. 6. TH negative: rhogam work up after delivery  Plan: Admit to L&D, anticipate vaginal delivery.  Hanan Moen L Calina Patrie, DO  11/04/2019, 9:16 AM

## 2019-11-04 NOTE — MAU Note (Signed)
Pt reports CTX since 0200 now 3-53min apart.  Unsure if leaking fluid?  First noticed some leaking around 1000 11/03/18.  Reports good fetal movement.

## 2019-11-04 NOTE — Progress Notes (Signed)
Gail Walker is a 29 y.o. [phone] at [redacted]w[redacted]d admitted for early labor, post dates, elevated BP in MAU  Subjective: Comfortable with epidural  Objective: BP 122/62   Pulse 86   Temp 97.9 F (36.6 C)   Resp 16   Wt 125.6 kg   LMP 01/09/2019 (Approximate)   SpO2 100%   BMI 47.51 kg/m  No intake/output data recorded.  FHT:  FHR: 130 bpm, variability: moderate,  accelerations:  Present,  decelerations:  Absent UC:   regular, every 6 minutes  SVE:   Dilation: 3 Effacement (%): 70 Station: -3 Exam by:: [employer] Rn  Labs: Lab Results  Component Value Date   WBC 8.9 11/04/2019   HGB 11.8 (L) 11/04/2019   HCT 36.6 11/04/2019   MCV 77.9 (L) 11/04/2019   PLT 270 11/04/2019    Assessment / Plan: Gail Walker is a 29 y.o. [id number] at [redacted]w[redacted]d here for early/labor, elevated BP, post dates  1. Labor: s/p Epidural. CTX spaced out to q6 minutes. Bishop score 5, will give buccal cytotec. Consider pit v AROM at next check. 2. FWB: Cat I 3. Pain: epidural 4. GBS: negative 5. Elevated BP:  Asymptomatic. Pre-E labs negative, P:Cr 0.04. ?GHTN, normotensive since receiving epidural. Continue to monitor. 6. RH negative: rhogam work up after delivery  Anticipate vaginal delivery  Marlowe Alt DO OB Fellow, [facility] 11/04/2019, 11:11 AM

## 2019-11-04 NOTE — Progress Notes (Addendum)
Labor Progress Note Gail Walker is a 30 y.o. [id number] at [redacted]w[redacted]d presented for early labor, post dates, elevated BP in MAU.  S:  Comfortable with epidural  O:  BP 133/90   Pulse (!) 116   Temp 98.2 F (36.8 C) (Oral)   Resp 16   Wt 125.6 kg   LMP 01/09/2019 (Approximate)   SpO2 100%   BMI 47.51 kg/m   FHT:  FHR: 145 bpm, variability: moderate,  accelerations:  Present,  decelerations: occasional variables UC:   regular, every 2-3 minutes  CVE: Dilation: Lip/rim Effacement (%): 100 Cervical Position: Posterior Station: 0 Presentation: Brow Exam by:: Dr. Salomon Mast   A&P Gail Walker is a 29 y.o. [id number] at [redacted]w[redacted]d here for early/labor, elevated BP, post dates   1. Labor: s/p cyto x1. Progressing well. AROM this check with scant clear fluid 2. FWB: Cat I 3. Pain: epidural 4. GBS: negative 5. Elevated BP:  Asymptomatic. Pre-E labs negative, P:Cr 0.04. ?GHTN, normotensive since receiving epidural. Continue to monitor. 6. RH negative: rhogam work up after delivery   Anticipate vaginal delivery  Phill Myron, MD 3:53 PM

## 2019-11-04 NOTE — Discharge Summary (Addendum)
Postpartum Discharge Summary   Patient Name: Gail Walker DOB: 04-03-1991 MRN: [id number]  Date of admission: 11/04/2019 Delivery date:11/04/2019  Delivering provider: Genia Del  Date of discharge: 11/06/2019  Admitting diagnosis: Indication for care in labor and delivery, antepartum [O75.9] Intrauterine pregnancy: [redacted]w[redacted]d    Secondary diagnosis:  Active Problems:   TOBACCO USER   Asthma   Rh negative state in antepartum period   Depression   Indication for care in labor and delivery, antepartum   Post-dates pregnancy  Additional problems: None    Discharge diagnosis: Term Pregnancy Delivered                                              Post partum procedures:None Augmentation: AROM, Pitocin and Cytotec Complications: None  Hospital course: Induction of Labor With Vaginal Delivery   29y.o. yo [id number] admitted to the hospital 11/04/2019 for induction of labor.  Indication for induction: postdates with two elevated BP's in MAU.  Patient had an uncomplicated labor course as follows: Initial SVE 3/70/-3. Patient received Cytotec, AROM and Pitocin and progressed to complete. Membrane Rupture Time/Date: 3:48 PM ,11/04/2019   Delivery Method:Vaginal, Spontaneous  Episiotomy: None  Lacerations:  None  Details of delivery can be found in separate delivery note. BP's monitored, never met criteria for gHTN as elevated BP's did not recur beyond 4 hours. Patient had a routine postpartum course. Patient is discharged home 11/06/19.  Newborn Data: Birth date:11/04/2019  Birth time:8:09 PM  Gender:Female  Living status:Living  Apgars:9 ,9  Weight:2809 g   Magnesium Sulfate received: No BMZ received: No Rhophylac:Yes MMR:N/A T-DaP:Given prenatally Flu: No Transfusion:No  Physical exam  Vitals:   11/05/19 1116 11/05/19 1335 11/05/19 2132 11/06/19 0530  BP: 127/75 121/85 110/72 119/78  Pulse: 89 86 78 88  Resp: '18 17 18 18  '$ Temp: 97.6 F (36.4 C) (!)  97.5 F (36.4 C) 97.8 F (36.6 C) 98.1 F (36.7 C)  TempSrc: Oral Oral Oral Axillary  SpO2:  100%  100%  Weight:       General: alert, cooperative and no distress Lochia: appropriate Uterine Fundus: firm Incision: N/A DVT Evaluation: no evidence of DVT, no significant calf/ankle edema Labs: Lab Results  Component Value Date   WBC 17.7 (H) 11/04/2019   HGB 10.5 (L) 11/04/2019   HCT 32.7 (L) 11/04/2019   MCV 78.6 (L) 11/04/2019   PLT 222 11/04/2019   CMP Latest Ref Rng & Units 11/04/2019  Glucose 70 - 99 mg/dL 96  BUN 6 - 20 mg/dL 7  Creatinine 0.44 - 1.00 mg/dL 0.71  Sodium 135 - 145 mmol/L 134(L)  Potassium 3.5 - 5.1 mmol/L 4.0  Chloride 98 - 111 mmol/L 104  CO2 22 - 32 mmol/L 20(L)  Calcium 8.9 - 10.3 mg/dL 9.5  Total Protein 6.5 - 8.1 g/dL 6.8  Total Bilirubin 0.3 - 1.2 mg/dL 0.5  Alkaline Phos 38 - 126 U/L 129(H)  AST 15 - 41 U/L 14(L)  ALT 0 - 44 U/L 10   Edinburgh Score: Edinburgh Postnatal Depression Scale Screening Tool 11/05/2019  I have been able to laugh and see the funny side of things. (No Data)     After visit meds:  Allergies as of 11/06/2019   No Known Allergies     Medication List    STOP taking these  medications   Blood Pressure Kit Devi   famotidine 20 MG tablet Commonly known as: PEPCID   montelukast 10 MG tablet Commonly known as: SINGULAIR     TAKE these medications   acetaminophen 325 MG tablet Commonly known as: TYLENOL Take 650 mg by mouth every 6 (six) hours as needed for mild pain or headache.   albuterol (2.5 MG/3ML) 0.083% nebulizer solution Commonly known as: PROVENTIL Take 6 mLs (5 mg total) by nebulization every 4 (four) hours as needed for wheezing or shortness of breath.   ibuprofen 600 MG tablet Commonly known as: ADVIL Take 1 tablet (600 mg total) by mouth every 8 (eight) hours as needed (pain).   Prenatal Vitamin 27-0.8 MG Tabs Take 1 tablet by mouth daily.   senna-docusate 8.6-50 MG tablet Commonly known  as: Senokot-S Take 2 tablets by mouth daily. Start taking on: November 07, 2019        Discharge home in stable condition Infant Feeding: Bottle Infant Disposition:home with mother Discharge instruction: per After Visit Summary and Postpartum booklet. Activity: Advance as tolerated. Pelvic rest for 6 weeks.  Diet: routine diet Future Appointments: Future Appointments  Date Time Provider [place]  11/08/2019  9:30 AM MC-SCREENING [facility]-SD[facility] None  11/12/2019 10:20 AM [facility]OC[facility] NURSE [facility]  11/19/2019  1:15 PM [facility]-BEHAVIORAL HEALTH CLINICIAN [facility] [place]  12/03/2019  3:35 PM [place], Gildardo Cranker, MD [facility]   Follow up Visit:   Please schedule this patient for a In person postpartum visit in 4 weeks with the following provider: Any provider. Additional Postpartum F/U:Postpartum Depression checkup and BP check 1 week  Low risk pregnancy complicated by: HTN Delivery mode:  Vaginal, Spontaneous  Anticipated Birth Control:  Unsure - considering IUD outpatient    11/06/2019 Barrington Ellison, MD [facility] Family Medicine Fellow, [facility] for [employer], [place]

## 2019-11-04 NOTE — Anesthesia Procedure Notes (Addendum)
Epidural Patient location during procedure: OB Start time: 11/04/2019 9:52 AM End time: 11/04/2019 10:09 AM  Staffing Anesthesiologist: Lucretia Kern, MD Performed: anesthesiologist   Preanesthetic Checklist Completed: patient identified, IV checked, risks and benefits discussed, monitors and equipment checked, pre-op evaluation and timeout performed  Epidural Patient position: sitting Prep: DuraPrep Patient monitoring: heart rate, continuous pulse ox and blood pressure Approach: midline Location: L4-L5 Injection technique: LOR air  Needle:  Needle type: Tuohy  Needle gauge: 17 G Needle length: 9 cm Needle insertion depth: 8 cm Catheter type: closed end flexible Catheter size: 19 Gauge Catheter at skin depth: 14 cm Test dose: negative  Assessment Events: blood not aspirated, injection not painful, no injection resistance, no paresthesia and negative IV test  Additional Notes First attempt at L3-4 with good loss but then slow drip of clear fluid. Needle removed and reattempted at L4-5 with crisp loss, no return of CSF. Catheter threaded easily and no CSF aspirated. Negative test dose. Pt counseled on increased possibility of headache postpartum. Reason for block:procedure for pain

## 2019-11-05 ENCOUNTER — Encounter (HOSPITAL_COMMUNITY): Payer: Self-pay | Admitting: Student

## 2019-11-05 DIAGNOSIS — O139 Gestational [pregnancy-induced] hypertension without significant proteinuria, unspecified trimester: Secondary | ICD-10-CM | POA: Insufficient documentation

## 2019-11-05 HISTORY — DX: Gestational (pregnancy-induced) hypertension without significant proteinuria, unspecified trimester: O13.9

## 2019-11-05 MED ORDER — RHO D IMMUNE GLOBULIN 1500 UNIT/2ML IJ SOSY
300.0000 ug | PREFILLED_SYRINGE | Freq: Once | INTRAMUSCULAR | Status: AC
  Administered 2019-11-05: 300 ug via INTRAVENOUS
  Filled 2019-11-05: qty 2

## 2019-11-05 MED ORDER — ALBUTEROL SULFATE HFA 108 (90 BASE) MCG/ACT IN AERS: 2.0000 | INHALATION_SPRAY | RESPIRATORY_TRACT | Status: DC | PRN

## 2019-11-05 NOTE — Progress Notes (Addendum)
POSTPARTUM PROGRESS NOTE  Post Partum Day 1  Subjective:  Gail Walker is a 29 y.o. [id number] s/p NSVD at [redacted]w[redacted]d.  She reports she is doing well. No acute events overnight. She denies any problems with ambulating, voiding or PO intake. Denies nausea or vomiting.  Pain is well controlled.  Lochia is appropriate.  Objective: Blood pressure (!) 90/47, pulse 83, temperature 98 F (36.7 C), temperature source Oral, resp. rate 18, weight 125.6 kg, last menstrual period 01/09/2019, SpO2 100 %, unknown if currently breastfeeding.  Physical Exam:  General: Alert, cooperative and no distress Chest: No respiratory distress Heart: Regular rate Abdomen: Soft, nontender Uterine Fundus: Firm DVT Evaluation: No calf swelling or tenderness Extremities: No edema Skin: Warm, dry  Recent Labs    11/04/19 0927 11/04/19 2139  HGB 11.8* 10.5*  HCT 36.6 32.7*    Assessment/Plan: Gail Walker is a 29 y.o. [id number] s/p NSVD at [redacted]w[redacted]d   PPD#1 - Doing well. Continue routine postpartum care.   1. Rh neg with A POS baby - Rhogam ordered 2. Asthma - Albuterol PRN ordered 3. gHTN - BP within normal limits since delivery. Will continue to monitor pressures. 4. Contraception: Undecided - counseled on birth control options and will continue to consider method 5. Feeding: Bottle  Dispo: Plan for discharge PPD#2.   LOS: 1 day   Evalina Field, MD Family Medicine PGY-3 11/05/2019, 7:57 AM   I saw and evaluated the patient. I agree with the findings and the plan of care as documented in the resident's note.  Jerilynn Birkenhead, MD [facility] Family Medicine Fellow, [facility] for [employer], [facility]

## 2019-11-05 NOTE — Anesthesia Postprocedure Evaluation (Signed)
Anesthesia Post Note  Patient: Advertising copywriter  Procedure(s) Performed: AN AD HOC LABOR EPIDURAL     Patient location during evaluation: Mother Baby Anesthesia Type: Epidural Level of consciousness: awake and alert Pain management: pain level controlled Vital Signs Assessment: post-procedure vital signs reviewed and stable Respiratory status: spontaneous breathing, nonlabored ventilation and respiratory function stable Cardiovascular status: stable Postop Assessment: no headache, no backache and epidural receding Anesthetic complications: no   No complications documented.  Last Vitals:  Vitals:   11/04/19 2353 11/05/19 0400  BP: 123/69 (!) 90/47  Pulse: (!) 104 83  Resp: 18 18  Temp: 36.7 C 36.7 C  SpO2: 100% 100%    Last Pain:  Vitals:   11/05/19 0400  TempSrc: Oral  PainSc: 0-No pain   Pain Goal:                   Lakenya Riendeau

## 2019-11-05 NOTE — Progress Notes (Signed)
[name] was referred for history of depression/anxiety. * Referral screened out by Clinical Social Worker because none of the following criteria appear to apply: ~ History of anxiety/depression during this pregnancy, or of post-partum depression following prior delivery. ~ Diagnosis of anxiety and/or depression within last 3 years. Per further chart review, it appears that [name] was diagnosed with depression in 2013.  OR * [name]'s symptoms currently being treated with medication and/or therapy.   Please contact the Clinical Social Worker if needs arise, by [name] request, or if [name] scores greater than 9/yes to question 10 on Edinburgh Postpartum Depression Screen.   Gail Walker S. Marice Angelino, MSW, LCSW [facility] [place] [phone]    

## 2019-11-06 ENCOUNTER — Other Ambulatory Visit: Payer: Medicaid Other

## 2019-11-06 LAB — RH IG WORKUP (INCLUDES ABO/RH)
ABO/RH(D): A NEG
Fetal Screen: NEGATIVE
Gestational Age(Wks): 40
Unit division: 0

## 2019-11-06 MED ORDER — IBUPROFEN 600 MG PO TABS: 600.0000 mg | ORAL_TABLET | Freq: Three times a day (TID) | ORAL | 0 refills | Status: DC | PRN

## 2019-11-06 MED ORDER — SENNOSIDES-DOCUSATE SODIUM 8.6-50 MG PO TABS: 2.0000 | ORAL_TABLET | ORAL | 0 refills | Status: DC

## 2019-11-08 ENCOUNTER — Other Ambulatory Visit (HOSPITAL_COMMUNITY): Payer: Medicaid Other

## 2019-11-10 ENCOUNTER — Inpatient Hospital Stay (HOSPITAL_COMMUNITY): Admission: AD | Admit: 2019-11-10 | Payer: Medicaid Other | Source: Home / Self Care | Admitting: Family Medicine

## 2019-11-10 ENCOUNTER — Inpatient Hospital Stay (HOSPITAL_COMMUNITY): Payer: Medicaid Other

## 2019-11-11 ENCOUNTER — Inpatient Hospital Stay (HOSPITAL_COMMUNITY)
Admission: AD | Admit: 2019-11-11 | Discharge: 2019-11-11 | Disposition: A | Discharge status: Home / Self Care | Payer: Medicaid Other | Attending: Obstetrics and Gynecology | Admitting: Obstetrics and Gynecology

## 2019-11-11 ENCOUNTER — Encounter (HOSPITAL_COMMUNITY): Payer: Self-pay | Admitting: Obstetrics and Gynecology

## 2019-11-11 ENCOUNTER — Other Ambulatory Visit: Payer: Self-pay

## 2019-11-11 DIAGNOSIS — K219 Gastro-esophageal reflux disease without esophagitis: Secondary | ICD-10-CM | POA: Insufficient documentation

## 2019-11-11 DIAGNOSIS — O9953 Diseases of the respiratory system complicating the puerperium: Secondary | ICD-10-CM | POA: Diagnosis not present

## 2019-11-11 DIAGNOSIS — Z87891 Personal history of nicotine dependence: Secondary | ICD-10-CM | POA: Diagnosis not present

## 2019-11-11 DIAGNOSIS — O9963 Diseases of the digestive system complicating the puerperium: Secondary | ICD-10-CM | POA: Diagnosis not present

## 2019-11-11 DIAGNOSIS — O99215 Obesity complicating the puerperium: Secondary | ICD-10-CM | POA: Insufficient documentation

## 2019-11-11 DIAGNOSIS — O165 Unspecified maternal hypertension, complicating the puerperium: Secondary | ICD-10-CM | POA: Insufficient documentation

## 2019-11-11 DIAGNOSIS — Z79899 Other long term (current) drug therapy: Secondary | ICD-10-CM | POA: Insufficient documentation

## 2019-11-11 DIAGNOSIS — K5900 Constipation, unspecified: Secondary | ICD-10-CM | POA: Insufficient documentation

## 2019-11-11 DIAGNOSIS — J45909 Unspecified asthma, uncomplicated: Secondary | ICD-10-CM | POA: Insufficient documentation

## 2019-11-11 LAB — CBC
HCT: 35.2 % — ABNORMAL LOW (ref 36.0–46.0)
Hemoglobin: 11.3 g/dL — ABNORMAL LOW (ref 12.0–15.0)
MCH: 25.7 pg — ABNORMAL LOW (ref 26.0–34.0)
MCHC: 32.1 g/dL (ref 30.0–36.0)
MCV: 80 fL (ref 80.0–100.0)
Platelets: 301 10*3/uL (ref 150–400)
RBC: 4.4 MIL/uL (ref 3.87–5.11)
RDW: 15.8 % — ABNORMAL HIGH (ref 11.5–15.5)
WBC: 8.6 10*3/uL (ref 4.0–10.5)
nRBC: 0 % (ref 0.0–0.2)

## 2019-11-11 LAB — COMPREHENSIVE METABOLIC PANEL
ALT: 28 U/L (ref 0–44)
AST: 20 U/L (ref 15–41)
Albumin: 2.8 g/dL — ABNORMAL LOW (ref 3.5–5.0)
Alkaline Phosphatase: 71 U/L (ref 38–126)
Anion gap: 8 (ref 5–15)
BUN: 8 mg/dL (ref 6–20)
CO2: 24 mmol/L (ref 22–32)
Calcium: 8.8 mg/dL — ABNORMAL LOW (ref 8.9–10.3)
Chloride: 104 mmol/L (ref 98–111)
Creatinine, Ser: 0.85 mg/dL (ref 0.44–1.00)
GFR calc Af Amer: >60 mL/min (ref >60)
GFR calc non Af Amer: >60 mL/min (ref >60)
Glucose, Bld: 97 mg/dL (ref 70–99)
Potassium: 3.9 mmol/L (ref 3.5–5.1)
Sodium: 136 mmol/L (ref 135–145)
Total Bilirubin: 0.6 mg/dL (ref 0.3–1.2)
Total Protein: 6.3 g/dL — ABNORMAL LOW (ref 6.5–8.1)

## 2019-11-11 MED ORDER — AMLODIPINE BESYLATE 2.5 MG PO TABS
2.5000 mg | ORAL_TABLET | Freq: Every day | ORAL | 1 refills | Status: DC
Start: 2019-11-11 — End: 2020-04-27

## 2019-11-11 MED ORDER — CYCLOBENZAPRINE HCL 5 MG PO TABS
10.0000 mg | ORAL_TABLET | Freq: Once | ORAL | Status: AC
  Administered 2019-11-11: 10 mg via ORAL
  Filled 2019-11-11: qty 2

## 2019-11-11 NOTE — BH Specialist Note (Addendum)
Integrated Behavioral Health via Telemedicine Phone  Visit  11/11/2019 [name] [id number]  Number of Integrated Behavioral Health visits: 1 Session Start time: 1:17  Session End time: 1:35 Total time: 18  Referring Provider: Jerilynn Birkenhead, MD Type of Visit: Phone Patient/Family location: Home [facility] Provider location: Center for [facility] at [facility]  All persons participating in visit: Patient Gail Walker and [facility] [place]   Confirmed patient's address: Yes  Confirmed patient's phone number: Yes  Any changes to demographics: No   Confirmed patient's insurance: Yes  Any changes to patient's insurance: No   Discussed confidentiality: Yes   I connected with Harrel Carina by a phone enabled telemedicine application ([employer]) and verified that I am speaking with the correct person using two identifiers.     I discussed the limitations of evaluation and management by telemedicine and the availability of in person appointments.  I discussed that the purpose of this visit is to provide behavioral health care while limiting exposure to the novel coronavirus.   Discussed there is a possibility of technology failure and discussed alternative modes of communication if that failure occurs.  I discussed that engaging in this virtual visit, they consent to the provision of behavioral healthcare and the services will be billed under their insurance.  Patient and/or legal guardian expressed understanding and consented to virtual visit: Yes   PRESENTING CONCERNS: Patient and/or family reports the following symptoms/concerns: Pt states her only symptom is lack of quality sleep, attributed to new baby sleep schedule, and has not applied for [facility]; good support at home, sleeping when baby sleeps, appetite has returned postpartum; no other concerns at this time.  Duration of problem: Postpartum; Severity of problem: mild  STRENGTHS (Protective  Factors/Coping Skills): Good social support  GOALS ADDRESSED: Patient will:  1.  Demonstrate ability to: Increase healthy adjustment to current life circumstances  INTERVENTIONS: Interventions utilized:  Psychoeducation and/or Health Education and Link to [employer] Standardized Assessments completed: GAD-7 and PHQ 9  ASSESSMENT: Patient currently experiencing Psychosocial stress.   Patient may benefit from psychoeducation and brief therapeutic interventions regarding coping with adjusting to new motherhood.  Marland Kitchen  PLAN: 1. Follow up with behavioral health clinician on : As needed, if symptoms increase 2. Behavioral recommendations:  -Continue sleeping when baby sleeps, eating healthy meals, and allowing family/friends to offer support as needed -Consider registering for and attending virtual new mom support at either [email or web address] or www.[email or web address].net -Consider apps for additional self-care (on After Visit Summary) -Accept referral to [facility] -Set up MyChart today (code sent via text) 3. Referral(s): Integrated Art gallery manager (In Clinic) and [employer]:  New mom support  I discussed the assessment and treatment plan with the patient and/or parent/guardian. They were provided an opportunity to ask questions and all were answered. They agreed with the plan and demonstrated an understanding of the instructions.   They were advised to call back or seek an in-person evaluation if the symptoms worsen or if the condition fails to improve as anticipated.  Valetta Close [facility]  Depression screen [facility] 2/9 10/29/2019 10/07/2019 09/06/2019 08/16/2019 03/21/2019  Decreased Interest 0 0 0 0 0  Down, Depressed, Hopeless 0 0 0 0 0  PHQ - 2 Score 0 0 0 0 0  Altered sleeping 0 0 2 - -  Tired, decreased energy 0 0 1 - -  Change in appetite 0 0 0 - -  Feeling bad or failure about yourself  0 0 0 - -  Trouble concentrating 0 0 0 - -  Moving slowly or fidgety/restless 0 0  0 - -  Suicidal thoughts 0 0 0 - -  PHQ-9 Score 0 0 3 - -  Difficult doing work/chores - Not difficult at all - - -   GAD 7 : Generalized Anxiety Score 10/29/2019 10/07/2019 09/06/2019  Nervous, Anxious, on Edge 0 0 0  Control/stop worrying 0 0 0  Worry too much - different things 0 0 0  Trouble relaxing 0 0 0  Restless 0 0 0  Easily annoyed or irritable 0 0 0  Afraid - awful might happen 0 0 0  Total GAD 7 Score 0 0 0

## 2019-11-11 NOTE — MAU Note (Signed)
Delivered 7/19.  Is constipated. Has taken 2 enemas.  Little results.  "can feel it is right there".  Has been taken stool softeners.   Is afraid to pee, because she doesn't want strain.  States was constipated prior to going in to labor.

## 2019-11-11 NOTE — Discharge Instructions (Signed)
You have constipation which is hard stools that are difficult to pass. It is important to have regular bowel movements every 1-3 days that are soft and easy to pass. Hard stools increase your risk of hemorrhoids and are very uncomfortable.   To prevent constipation you can increase the amount of fiber in your diet. Examples of foods with fiber are leafy greens, whole grain breads, oatmeal and other grains.  It is also important to drink at least eight 8oz glass of water everyday.   If you have not has a bowel movement in 4-5 days you made need to clean out your bowel.  This will have establish normal movement through your bowel.    Miralax Clean out  Take 8 capfuls of miralax in 64 oz of gatorade. You can use any fluid that appeals to you (gatorade, water, juice)  Continue to drink at least eight 8 oz glasses of water throughout the day  You can repeat with another 8 capfuls of miralax in 64 oz of gatorade if you are not having a large amount of stools  You will need to be at home and close to a bathroom for about 8 hours when you do the above as you may need to go to the bathroom frequently.   After you are cleaned out: - Start Colace100mg  twice daily - Start Miralax once daily - Start a daily fiber supplement like metamucil or citrucel - You can safely use enemas in pregnancy  - if you are having diarrhea you can reduce to Colace once a day or miralax every other day or a 1/2 capful daily.    Postpartum Hypertension Postpartum hypertension is high blood pressure that remains higher than normal after childbirth. You may not realize that you have postpartum hypertension if your blood pressure is not being checked regularly. In most cases, postpartum hypertension will go away on its own, usually within a week of delivery. However, for some women, medical treatment is required to prevent serious complications, such as seizures or stroke. What are the causes? This condition may be caused by  one or more of the following:  Hypertension that existed before pregnancy (chronic hypertension).  Hypertension that comes on as a result of pregnancy (gestational hypertension).  Hypertensive disorders during pregnancy (preeclampsia) or seizures in women who have high blood pressure during pregnancy (eclampsia).  A condition in which the liver, platelets, and red blood cells are damaged during pregnancy (HELLP syndrome).  A condition in which the thyroid produces too much hormones (hyperthyroidism).  Other rare problems of the nerves (neurological disorders) or blood disorders. In some cases, the cause may not be known. What increases the risk? The following factors may make you more likely to develop this condition:  Chronic hypertension. In some cases, this may not have been diagnosed before pregnancy.  Obesity.  Type 2 diabetes.  Kidney disease.  History of preeclampsia or eclampsia.  Other medical conditions that change the level of hormones in the body (hormonal imbalance). What are the signs or symptoms? As with all types of hypertension, postpartum hypertension may not have any symptoms. Depending on how high your blood pressure is, you may experience:  Headaches. These may be mild, moderate, or severe. They may also be steady, constant, or sudden in onset (thunderclap headache).  Changes in your ability to see (visual changes).  Dizziness.  Shortness of breath.  Swelling of your hands, feet, lower legs, or face. In some cases, you may have swelling in more than  one of these locations.  Heart palpitations or a racing heartbeat.  Difficulty breathing while lying down.  Decrease in the amount of urine that you pass. Other rare signs and symptoms may include:  Sweating more than usual. This lasts longer than a few days after delivery.  Chest pain.  Sudden dizziness when you get up from sitting or lying down.  Seizures.  Nausea or vomiting.  Abdominal  pain. How is this diagnosed? This condition may be diagnosed based on the results of a physical exam, blood pressure measurements, and blood and urine tests. You may also have other tests, such as a CT scan or an MRI, to check for other problems of postpartum hypertension. How is this treated? If blood pressure is high enough to require treatment, your options may include:  Medicines to reduce blood pressure (antihypertensives). Tell your health care provider if you are breastfeeding or if you plan to breastfeed. There are many antihypertensive medicines that are safe to take while breastfeeding.  Stopping medicines that may be causing hypertension.  Treating medical conditions that are causing hypertension.  Treating the complications of hypertension, such as seizures, stroke, or kidney problems. Your health care provider will also continue to monitor your blood pressure closely until it is within a safe range for you. Follow these instructions at home:  Take over-the-counter and prescription medicines only as told by your health care provider.  Return to your normal activities as told by your health care provider. Ask your health care provider what activities are safe for you.  Do not use any products that contain nicotine or tobacco, such as cigarettes and e-cigarettes. If you need help quitting, ask your health care provider.  Keep all follow-up visits as told by your health care provider. This is important. Contact a health care provider if:  Your symptoms get worse.  You have new symptoms, such as: ? A headache that does not get better. ? Dizziness. ? Visual changes. Get help right away if:  You suddenly develop swelling in your hands, ankles, or face.  You have sudden, rapid weight gain.  You develop difficulty breathing, chest pain, racing heartbeat, or heart palpitations.  You develop severe pain in your abdomen.  You have any symptoms of a stroke. "BE FAST" is an  easy way to remember the main warning signs of a stroke: ? B - Balance. Signs are dizziness, sudden trouble walking, or loss of balance. ? E - Eyes. Signs are trouble seeing or a sudden change in vision. ? F - Face. Signs are sudden weakness or numbness of the face, or the face or eyelid drooping on one side. ? A - Arms. Signs are weakness or numbness in an arm. This happens suddenly and usually on one side of the body. ? S - Speech. Signs are sudden trouble speaking, slurred speech, or trouble understanding what people say. ? T - Time. Time to call emergency services. Write down what time symptoms started.  You have other signs of a stroke, such as: ? A sudden, severe headache with no known cause. ? Nausea or vomiting. ? Seizure. These symptoms may represent a serious problem that is an emergency. Do not wait to see if the symptoms will go away. Get medical help right away. Call your local emergency services (911 in the [place] Do not drive yourself to the hospital. Summary  Postpartum hypertension is high blood pressure that remains higher than normal after childbirth.  In most cases, postpartum hypertension will go  away on its own, usually within a week of delivery.  For some women, medical treatment is required to prevent serious complications, such as seizures or stroke. This information is not intended to replace advice given to you by your health care provider. Make sure you discuss any questions you have with your health care provider. Document Revised: 05/11/2018 Document Reviewed: 01/23/2017 Elsevier Patient Education  2020 [employer].

## 2019-11-11 NOTE — MAU Provider Note (Signed)
History     Patient Active Problem List   Diagnosis Date Noted  . Gestational hypertension 11/05/2019  . Indication for care in labor and delivery, antepartum 11/04/2019  . Post-dates pregnancy 11/04/2019  . Obesity affecting pregnancy 09/05/2019  . BMI 40.0-44.9, adult (HCC) 05/15/2018  . Morning sickness 03/12/2018  . Depression 11/14/2011  . Rh negative state in antepartum period 02/23/2011  . Supervision of high risk pregnancy, antepartum 01/26/2011  . TOBACCO USER 12/27/2008  . Morbid obesity (HCC) 06/15/2006  . RHINITIS, ALLERGIC 06/15/2006  . Asthma 06/15/2006  . ECZEMA, ATOPIC DERMATITIS 06/15/2006    Chief Complaint  Patient presents with  . Constipation   Gail Walker is a 29 y.o. [id number] at 7 days postpartum c/o severe constipation with accompanying abdominal pain. She has had a very small runny bowel movement in the past 24hrs but can feel stool just inside her rectum. She has tried a Educational psychologist, drinks "some" water, and diet consists of mostly convenience foods. She verbalized not being able to relax to relieve her bladder or even try to have a BM.   Constipation This is a new problem. The current episode started in the past 7 days. The problem has been gradually worsening since onset. Her stool frequency is 1 time per week or less. Stool description: small amount of runny stool. The patient is not on a high fiber diet. She does not exercise regularly. There has been adequate water intake. Associated symptoms include abdominal pain, flatus and rectal pain. Pertinent negatives include no back pain, fecal incontinence, fever, nausea or vomiting. She has tried enemas, laxatives and stool softeners for the symptoms. The treatment provided no relief.    OB History    Gravida  6   Para  2   Term  2   Preterm  0   AB  4   Living  2     SAB  1   TAB  3   Ectopic  0   Multiple  0   Live Births  2           Past Medical History:   Diagnosis Date  . Acne   . Asthma    uses albuterol inhaler daily  . CAP (community acquired pneumonia) 11/14/2015  . Chlamydia 2012  . GERD (gastroesophageal reflux disease)    during pregnancy - takes zantac    Past Surgical History:  Procedure Laterality Date  . DILATION AND EVACUATION N/A 05/16/2018   Procedure: DILATATION AND EVACUATION;  Surgeon: Goodhue Bing, MD;  Location: [facility];  Service: Gynecology;  Laterality: N/A;  . OPERATIVE ULTRASOUND N/A 05/16/2018   Procedure: OPERATIVE ULTRASOUND;  Surgeon: Robin [place] Bing, MD;  Location: [facility];  Service: Gynecology;  Laterality: N/A;  . pregnancy termination      Family History  Problem Relation Age of Onset  . Diabetes Father   . Cancer Maternal Aunt   . Anesthesia problems Neg Hx   . Hearing loss Neg Hx   . Stroke Neg Hx   . CAD Neg Hx     Social History   Tobacco Use  . Smoking status: Former Smoker    Packs/day: 0.25    Years: 3.00    Pack years: 0.75    Types: Cigarettes    Quit date: 03/06/2019    Years since quitting: 0.6  . Smokeless tobacco: Never Used  Vaping Use  . Vaping Use: Never used  Substance Use Topics  .  Alcohol use: No  . Drug use: No    Allergies: No Known Allergies  Medications Prior to Admission  Medication Sig Dispense Refill Last Dose  . acetaminophen (TYLENOL) 325 MG tablet Take 650 mg by mouth every 6 (six) hours as needed for mild pain or headache.   11/10/2019 at Unknown time  . albuterol (PROVENTIL) (2.5 MG/3ML) 0.083% nebulizer solution Take 6 mLs (5 mg total) by nebulization every 4 (four) hours as needed for wheezing or shortness of breath. 75 mL 1   . ibuprofen (ADVIL) 600 MG tablet Take 1 tablet (600 mg total) by mouth every 8 (eight) hours as needed (pain). 30 tablet 0 Unknown at Unknown time  . Prenatal Vit-Fe Fumarate-FA (PRENATAL VITAMIN) 27-0.8 MG TABS Take 1 tablet by mouth daily. (Patient not taking: Reported on 11/04/2019) 90  tablet 3 Unknown at Unknown time  . senna-docusate (SENOKOT-S) 8.6-50 MG tablet Take 2 tablets by mouth daily. 14 tablet 0  at not taking    Review of Systems  Constitutional: Negative for fever.  Gastrointestinal: Positive for abdominal pain, constipation, flatus and rectal pain. Negative for nausea and vomiting.  Musculoskeletal: Negative for back pain.  All other systems reviewed and are negative.   See HPI Above Physical Exam   Blood pressure (!) 138/90, pulse 82, temperature 98.2 F (36.8 C), resp. rate 20, SpO2 98 %, not currently breastfeeding.  Results for orders placed or performed during the hospital encounter of 11/11/19 (from the past 24 hour(s))  CBC     Status: Abnormal   Collection Time: 11/11/19  2:32 PM  Result Value Ref Range   WBC 8.6 4.0 - 10.5 K/uL   RBC 4.40 3.87 - 5.11 MIL/uL   Hemoglobin 11.3 (L) 12.0 - 15.0 g/dL   HCT 67.6 (L) 36 - 46 %   MCV 80.0 80.0 - 100.0 fL   MCH 25.7 (L) 26.0 - 34.0 pg   MCHC 32.1 30.0 - 36.0 g/dL   RDW 19.5 (H) 09.3 - 26.7 %   Platelets 301 150 - 400 K/uL   nRBC 0.0 0.0 - 0.2 %  Comprehensive metabolic panel     Status: Abnormal   Collection Time: 11/11/19  2:32 PM  Result Value Ref Range   Sodium 136 135 - 145 mmol/L   Potassium 3.9 3.5 - 5.1 mmol/L   Chloride 104 98 - 111 mmol/L   CO2 24 22 - 32 mmol/L   Glucose, Bld 97 70 - 99 mg/dL   BUN 8 6 - 20 mg/dL   Creatinine, Ser 1.24 0.44 - 1.00 mg/dL   Calcium 8.8 (L) 8.9 - 10.3 mg/dL   Total Protein 6.3 (L) 6.5 - 8.1 g/dL   Albumin 2.8 (L) 3.5 - 5.0 g/dL   AST 20 15 - 41 U/L   ALT 28 0 - 44 U/L   Alkaline Phosphatase 71 38 - 126 U/L   Total Bilirubin 0.6 0.3 - 1.2 mg/dL   GFR calc non Af Amer >60 >60 mL/min   GFR calc Af Amer >60 >60 mL/min   Anion gap 8 5 - 15    Physical Exam Constitutional:      Appearance: She is obese. She is ill-appearing (appears very uncomfortable).  Cardiovascular:     Rate and Rhythm: Normal rate and regular rhythm.     Pulses: Normal  pulses.     Heart sounds: Normal heart sounds.  Pulmonary:     Effort: Pulmonary effort is normal.     Breath sounds:  Normal breath sounds.  Abdominal:     General: Bowel sounds are normal. There is distension.     Palpations: There is no mass.     Tenderness: There is no abdominal tenderness. There is no guarding or rebound.  Skin:    General: Skin is warm and dry.     Capillary Refill: Capillary refill takes less than 2 seconds.  Neurological:     Mental Status: She is alert and oriented to person, place, and time.  Psychiatric:        Mood and Affect: Mood normal.        Behavior: Behavior normal.        Thought Content: Thought content normal.        Judgment: Judgment normal.    ED Course  Assessment: Constipation Postpartum hypertension  Plan: - Flexeril - relieved abdominal and rectal pain - Soap suds enema (Dr Forestine Chute Slush Enema) - patient able to completely empty bowel, expressed relief - Patient had hypertensive BP off and on throughout visit, waited until after she was comfortable to begin serial BPs. Most were elevated but without severe features. Pre-e bloodwork WNL. - Norvasc 2.5mg  daily prescribed - Discharge home in stable conditions on bowel protocol - Preeclampsia precautions given - Follow up in [facility] at [facility] for BP check (message sent to office)  Edd Arbour, CNM, MSN, [facility] 11/11/19 4:53 PM

## 2019-11-12 ENCOUNTER — Ambulatory Visit: Payer: Medicaid Other

## 2019-11-19 ENCOUNTER — Other Ambulatory Visit: Payer: Self-pay

## 2019-11-19 ENCOUNTER — Ambulatory Visit (INDEPENDENT_AMBULATORY_CARE_PROVIDER_SITE_OTHER): Payer: Medicaid Other | Admitting: Clinical

## 2019-11-19 DIAGNOSIS — O99345 Other mental disorders complicating the puerperium: Secondary | ICD-10-CM

## 2019-11-19 DIAGNOSIS — Z658 Other specified problems related to psychosocial circumstances: Secondary | ICD-10-CM | POA: Diagnosis not present

## 2019-11-20 ENCOUNTER — Telehealth: Payer: Self-pay

## 2019-11-21 NOTE — Telephone Encounter (Signed)
Sent to PCP ?

## 2019-11-25 ENCOUNTER — Other Ambulatory Visit: Payer: Self-pay | Admitting: Family Medicine

## 2019-11-25 MED ORDER — DULERA 100-5 MCG/ACT IN AERO: 2.0000 | INHALATION_SPRAY | Freq: Two times a day (BID) | RESPIRATORY_TRACT | 2 refills | Status: DC

## 2019-12-03 ENCOUNTER — Ambulatory Visit: Payer: Medicaid Other | Admitting: Obstetrics and Gynecology

## 2019-12-24 ENCOUNTER — Ambulatory Visit: Payer: Medicaid Other | Admitting: Certified Nurse Midwife

## 2019-12-26 ENCOUNTER — Encounter: Payer: Self-pay | Admitting: General Practice

## 2020-01-13 ENCOUNTER — Other Ambulatory Visit: Payer: Self-pay

## 2020-01-15 MED ORDER — BETAMETHASONE VALERATE 0.1 % EX OINT
1.0000 | TOPICAL_OINTMENT | Freq: Two times a day (BID) | CUTANEOUS | 1 refills | Status: DC | PRN
Start: 2020-01-15 — End: 2020-02-02

## 2020-01-30 ENCOUNTER — Telehealth: Payer: Self-pay

## 2020-01-30 NOTE — Telephone Encounter (Signed)
Patient calls nurse line requesting switch in skin cream. Patient is currently receiving betamethasone ointment, however, she can only get small tubes from the pharmacy. Patient states that she has to apply cream over her whole body and she needs a medication that comes in a larger quantity. Patient is requesting to switch back to Triamcinolone cream, that comes in a larger quantity.   To PCP   Veronda Prude, RN

## 2020-02-02 ENCOUNTER — Other Ambulatory Visit: Payer: Self-pay | Admitting: Family Medicine

## 2020-02-02 DIAGNOSIS — L300 Nummular dermatitis: Secondary | ICD-10-CM

## 2020-02-02 MED ORDER — TRIAMCINOLONE ACETONIDE 0.5 % EX OINT: 1.0000 "application " | TOPICAL_OINTMENT | Freq: Two times a day (BID) | CUTANEOUS | 3 refills | Status: DC

## 2020-03-25 ENCOUNTER — Other Ambulatory Visit: Payer: Self-pay | Admitting: Family Medicine

## 2020-03-27 ENCOUNTER — Encounter: Payer: Self-pay | Admitting: Family Medicine

## 2020-03-27 ENCOUNTER — Other Ambulatory Visit: Payer: Self-pay

## 2020-03-27 ENCOUNTER — Other Ambulatory Visit (HOSPITAL_COMMUNITY)
Admission: RE | Admit: 2020-03-27 | Discharge: 2020-03-27 | Disposition: A | Discharge status: Home / Self Care | Payer: Medicaid Other | Source: Ambulatory Visit | Attending: Family Medicine | Admitting: Family Medicine

## 2020-03-27 ENCOUNTER — Other Ambulatory Visit: Payer: Self-pay | Admitting: Family Medicine

## 2020-03-27 ENCOUNTER — Ambulatory Visit (INDEPENDENT_AMBULATORY_CARE_PROVIDER_SITE_OTHER): Payer: Medicaid Other | Admitting: Family Medicine

## 2020-03-27 VITALS — BP 120/72 | HR 97 | Wt 275.1 lb

## 2020-03-27 DIAGNOSIS — N898 Other specified noninflammatory disorders of vagina: Secondary | ICD-10-CM | POA: Diagnosis present

## 2020-03-27 DIAGNOSIS — F53 Postpartum depression: Secondary | ICD-10-CM | POA: Insufficient documentation

## 2020-03-27 DIAGNOSIS — Z1159 Encounter for screening for other viral diseases: Secondary | ICD-10-CM

## 2020-03-27 DIAGNOSIS — B9689 Other specified bacterial agents as the cause of diseases classified elsewhere: Secondary | ICD-10-CM

## 2020-03-27 DIAGNOSIS — O99345 Other mental disorders complicating the puerperium: Secondary | ICD-10-CM | POA: Diagnosis not present

## 2020-03-27 DIAGNOSIS — Z113 Encounter for screening for infections with a predominantly sexual mode of transmission: Secondary | ICD-10-CM | POA: Diagnosis not present

## 2020-03-27 DIAGNOSIS — N760 Acute vaginitis: Secondary | ICD-10-CM

## 2020-03-27 HISTORY — DX: Postpartum depression: F53.0

## 2020-03-27 HISTORY — DX: Other specified bacterial agents as the cause of diseases classified elsewhere: B96.89

## 2020-03-27 HISTORY — DX: Other specified bacterial agents as the cause of diseases classified elsewhere: N76.0

## 2020-03-27 LAB — POCT WET PREP (WET MOUNT)
Clue Cells Wet Prep Whiff POC: POSITIVE
Trichomonas Wet Prep HPF POC: ABSENT

## 2020-03-27 LAB — POCT URINE PREGNANCY: Preg Test, Ur: NEGATIVE

## 2020-03-27 MED ORDER — METRONIDAZOLE 500 MG PO TABS
500.0000 mg | ORAL_TABLET | Freq: Two times a day (BID) | ORAL | 0 refills | Status: DC
Start: 2020-03-27 — End: 2020-04-27

## 2020-03-27 MED ORDER — FLUOXETINE HCL 20 MG PO TABS
20.0000 mg | ORAL_TABLET | Freq: Every day | ORAL | 3 refills | Status: DC
Start: 2020-03-27 — End: 2020-03-28

## 2020-03-27 MED ORDER — METRONIDAZOLE 0.75 % VA GEL: 1.0000 | VAGINAL | 5 refills | Status: DC

## 2020-03-27 NOTE — Progress Notes (Signed)
    SUBJECTIVE:   CHIEF COMPLAINT / HPI:   Vaginal Discharge: Patient is a 29 y.o. female presenting with vaginal discharge for 3 days.  She endorses positive vaginal odor.  She is interested in screening for sexually transmitted infections today.  Per patient report, she has had for more than 3 cases of bacterial vaginosis in the past year.  She estimates she has had roughly 7 episodes of BV this year.  Postpartum depression [name] reports that she has been feeling down and depressed since the birth of her last child about 3 months ago.  She does not see any obvious reason for depression and wonders if this could be related to postpartum depression.  She scored a 14 on her PHQ-9 today with a 2 for question 9.  She reports passive suicidal ideation in the form of thinking that she would be better off dead although she has no intent or plan to harm herself or others.  She is interested in seeking help for this issue.  PERTINENT  PMH / PSH: BMI over 40  OBJECTIVE:   BP 120/72   Pulse 97   Wt 275 lb 2 oz (124.8 kg)   LMP 03/13/2020   SpO2 97%   BMI 47.23 kg/m    General: NAD, pleasant, able to participate in exam Respiratory: Normal effort, no obvious respiratory distress Pelvic: VULVA: normal appearing vulva with no masses, tenderness or lesions, VAGINA: Normal appearing vagina with normal color, no lesions, with white discharge present,   Chaperone present for pelvic exam  ASSESSMENT/PLAN:   BV (bacterial vaginosis) Physical exam and wet prep consistent with bacterial vaginosis. -Metronidazole 500 mg twice daily for 7 days -MetroGel twice weekly for the next 3 months for recurrent BV.  It would be appropriate to extend this treatment if her BV returns following treatment cessation. -Follow-up GC/chlamydia, HIV, RPR  Postpartum depression She is interested in starting treatment getting additional help at this time. -Start fluoxetine 20 mg daily -Video visit in 2 weeks if  possible -Return to clinic in 4 weeks for reassessment -List of therapists in the community provided, suicide prevention hotline information provided -She was offered additional resources in the form of social work calling to check in on her in the next week or 2.  She declined this resource.     Gail Mo, MD [facility]

## 2020-03-27 NOTE — Patient Instructions (Signed)
Bacterial vaginosis: You have had a positive for bacterial vaginosis here in clinic.  I have prescribed 1 week of metronidazole pills to help resolve your symptoms.  This seems to be a recurrent issue for you.  The treatment for recurrent bacterial vaginosis is MetroGel twice weekly for 3 months.  I think we can try this for 3 months and see if that prevents future infections.  If you have infections after 3 months, we can keep you on MetroGel twice weekly indefinitely.  Postpartum depression: I am sorry this has been difficult for you.  We will start this today.  I have sent in a prescription for fluoxetine 20 mg.  I have also provided a list of therapists in the community free to reach out to to establish care.  Please be aware that this can make you feel a little bit worse about a week after starting the medicine before experiencing significant improvement.  Please come back to clinic in 4 weeks for [place] to reassess and see how you are doing.  [place] National Suicide Hotline  [phone] (TALK)  Skin concerns: I placed a referral to dermatology.  You should get a call in the next 1-2 weeks to set up an appointment.   Therapy and Counseling Resources Most providers on this list will take Medicaid. Patients with commercial insurance or Medicare should contact their insurance company to get a list of in network providers.  [facility] [address] [place], [place] [address] [phone]  [facility]  [address], Suite [place], [place] [address]      [phone]  [facility] [address]  [place], [place] [address] [phone]  [facility] [address]  [place], [place] [address]       [phone]      Jovita Kussmaul [facility] [address], [place], [place] [phone]  [facility]:  [address] [place] [phone]  [facility]:   [address] [place] [phone]  [facility] (under & uninsured) [address]   [place] [phone]    [email or web address]    [facility] [address] . [place]    [phone]  [facility] -[address]     Phone:  [phone]     [facility]-  [address]  [phone]   [facility] [address]      [place], [place] [phone]  [facility]: [address] [place], [place], [place]  [phone]   [facility] (Spanish therapist) [email or web address]  [address] [place]  [address]   [place] [address]    [phone]    [facility]   [address],  [place], [place]  [phone]   [facility]  [address] [place]  [phone]  [facility]  [address] [place], [place]        [phone]  Open Access/Walk In Clinic under & uninsured  [facility]  [address] [place], [place] [phone] Crisis [phone]  [facility] [address],  (Spanish)   [address] [place], [place]: [phone]) 8:30 - 12; 1 - 2:30  [facility] [employer],  [address] [place], [place]    [phone]):8:30 - 12;  2 - 3PM  [facility] [employer],  [address]  [place]; [phone]):   Mon - Fri 8 AM - 5 PM  [facility] [address] [place]  MWF 12:30 to 3:00 or call to schedule an appointment  [phone]  Specific Provider options Psychology Today  https://www.psychologytoday.com/us 1. click on find a therapist  2. enter your zip code 3. left side and select or tailor a therapist for your specific need.   [facility] Provider Directory [email or web address]  (Medicaid)   Follow all drop down to find a  provider  Social Support program [facility] [phone] or [employer] [address], [place], [place] Recovery support and educational   24- Hour Availability:  .  Marland Kitchen [facility] [place]  . [address] [place], [place] [employer] [phone] Crisis [phone]  . [facility] [phone]  [facility] Crisis Service  [phone]   . [facility] [employer]  [phone] (after hours)  . Therapeutic Alternative/Mobile Crisis   [phone]  . [place] National Suicide Hotline  508-706-8644 (TALK)  . Call 911 or go to emergency room  . [employer]  [phone]);  [place] and [employer]   . [employer] [facility]  [phone]); [place], [place], [place], [place], [place], [place], [place]

## 2020-03-27 NOTE — Assessment & Plan Note (Signed)
Physical exam and wet prep consistent with bacterial vaginosis. -Metronidazole 500 mg twice daily for 7 days -MetroGel twice weekly for the next 3 months for recurrent BV.  It would be appropriate to extend this treatment if her BV returns following treatment cessation. -Follow-up GC/chlamydia, HIV, RPR

## 2020-03-27 NOTE — Assessment & Plan Note (Addendum)
She is interested in starting treatment getting additional help at this time. -Start fluoxetine 20 mg daily -Video visit in 2 weeks if possible -Return to clinic in 4 weeks for reassessment -List of therapists in the community provided, suicide prevention hotline information provided -She was offered additional resources in the form of social work calling to check in on her in the next week or 2.  She declined this resource.

## 2020-03-28 LAB — HEPATITIS C ANTIBODY: Hep C Virus Ab: <0.1 s/co ratio (ref 0.0–0.9)

## 2020-03-28 LAB — HIV ANTIBODY (ROUTINE TESTING W REFLEX): HIV Screen 4th Generation wRfx: NONREACTIVE

## 2020-03-28 LAB — RPR: RPR Ser Ql: NONREACTIVE

## 2020-03-30 LAB — CERVICOVAGINAL ANCILLARY ONLY
Chlamydia: NEGATIVE
Comment: NEGATIVE
Comment: NORMAL
Neisseria Gonorrhea: NEGATIVE

## 2020-04-20 ENCOUNTER — Ambulatory Visit: Payer: Medicaid Other | Admitting: Family Medicine

## 2020-04-27 ENCOUNTER — Other Ambulatory Visit: Payer: Self-pay

## 2020-04-27 ENCOUNTER — Ambulatory Visit (HOSPITAL_COMMUNITY)
Admission: EM | Admit: 2020-04-27 | Discharge: 2020-04-27 | Disposition: A | Discharge status: Home / Self Care | Payer: Medicaid Other | Attending: Family Medicine | Admitting: Family Medicine

## 2020-04-27 ENCOUNTER — Encounter (HOSPITAL_COMMUNITY): Payer: Self-pay

## 2020-04-27 DIAGNOSIS — J4541 Moderate persistent asthma with (acute) exacerbation: Secondary | ICD-10-CM | POA: Diagnosis not present

## 2020-04-27 MED ORDER — ALBUTEROL SULFATE HFA 108 (90 BASE) MCG/ACT IN AERS
INHALATION_SPRAY | RESPIRATORY_TRACT | Status: AC
  Filled 2020-04-27: qty 6.7

## 2020-04-27 MED ORDER — ALBUTEROL SULFATE HFA 108 (90 BASE) MCG/ACT IN AERS
2.0000 | INHALATION_SPRAY | Freq: Once | RESPIRATORY_TRACT | Status: AC
  Administered 2020-04-27: 2 via RESPIRATORY_TRACT

## 2020-04-27 MED ORDER — PREDNISONE 20 MG PO TABS: 20.0000 mg | ORAL_TABLET | Freq: Two times a day (BID) | ORAL | 0 refills | Status: DC

## 2020-04-27 NOTE — ED Triage Notes (Signed)
Patient complained of shortness of breath/asthma attack at front counter. Pt states she is still having some difficulty catching her breath. Pt states she needs a steroid but her primary care will not refill without her being seen. Pt is speaking in complete sentences and is oxygen sat of 99 percent on evaluation in triage. Pt placed in lobby . Pt is aox4 and ambulatory and stable to wait.

## 2020-04-27 NOTE — ED Provider Notes (Signed)
[facility]    CSN: [id number] Arrival date & time: 04/27/20  2542      History   Chief Complaint Chief Complaint  Patient presents with  . Shortness of Breath    Occurred today    HPI Gail Walker is a 30 y.o. female.   HPI Patient has a history of asthma.  She ran out of her [facility] on her albuterol.  She talked with the pharmacist who was told her that she cannot get these refilled under her Medicaid for another couple of days.  She has an asthma exacerbation.  Short of breath.  Coughing.  This has been going on for the last couple of days.  She states that she does not have any fever or chills, headache or body aches, no signs of infection.  She states this feels like a typical asthma flare She is not COVID vaccinated.  This is recommended for her Past Medical History:  Diagnosis Date  . Acne   . Asthma    uses albuterol inhaler daily  . CAP (community acquired pneumonia) 11/14/2015  . Chlamydia 2012  . GERD (gastroesophageal reflux disease)    during pregnancy - takes zantac    Patient Active Problem List   Diagnosis Date Noted  . BV (bacterial vaginosis) 03/27/2020  . Postpartum depression 03/27/2020  . Gestational hypertension 11/05/2019  . Indication for care in labor and delivery, antepartum 11/04/2019  . Post-dates pregnancy 11/04/2019  . Obesity affecting pregnancy 09/05/2019  . BMI 40.0-44.9, adult (HCC) 05/15/2018  . Morning sickness 03/12/2018  . Depression 11/14/2011  . Rh negative state in antepartum period 02/23/2011  . Supervision of high risk pregnancy, antepartum 01/26/2011  . TOBACCO USER 12/27/2008  . Morbid obesity (HCC) 06/15/2006  . RHINITIS, ALLERGIC 06/15/2006  . Asthma 06/15/2006  . ECZEMA, ATOPIC DERMATITIS 06/15/2006    Past Surgical History:  Procedure Laterality Date  . DILATION AND EVACUATION N/A 05/16/2018   Procedure: DILATATION AND EVACUATION;  Surgeon: [place] Bing, MD;  Location: [facility];   Service: Gynecology;  Laterality: N/A;  . OPERATIVE ULTRASOUND N/A 05/16/2018   Procedure: OPERATIVE ULTRASOUND;  Surgeon: [place] Bing, MD;  Location: [facility];  Service: Gynecology;  Laterality: N/A;  . pregnancy termination      OB History    Gravida  6   Para  2   Term  2   Preterm  0   AB  4   Living  2     SAB  1   IAB  3   Ectopic  0   Multiple  0   Live Births  2            Home Medications    Prior to Admission medications   Medication Sig Start Date End Date Taking? Authorizing Provider  acetaminophen (TYLENOL) 325 MG tablet Take 650 mg by mouth every 6 (six) hours as needed for mild pain or headache.   Yes [provider]  albuterol (PROVENTIL) (2.5 MG/3ML) 0.083% nebulizer solution Take 6 mLs (5 mg total) by nebulization every 4 (four) hours as needed for wheezing or shortness of breath. 08/16/19  Yes Bland, Scott, DO  albuterol (VENTOLIN HFA) 108 (90 Base) MCG/ACT inhaler INHALE 2 PUFFS INTO THE LUNGS EVERY 4 (FOUR) HOURS AS NEEDED FOR WHEEZING OR SHORTNESS OF BREATH. 03/28/20  Yes Peggyann Shoals C, DO  predniSONE (DELTASONE) 20 MG tablet Take 1 tablet (20 mg total) by mouth 2 (two) times daily with  a meal. 04/27/20  Yes Eustace Moore, MD  mometasone-formoterol [facility]) 100-5 MCG/ACT AERO Inhale 2 puffs into the lungs in the morning and at bedtime. 11/25/19   Dollene Cleveland, DO  triamcinolone ointment (KENALOG) 0.5 % Apply 1 application topically 2 (two) times daily. For moderate to severe eczema.  Do not use for more than 1 week at a time. 02/02/20   Dollene Cleveland, DO  amLODipine (NORVASC) 2.5 MG tablet Take 1 tablet (2.5 mg total) by mouth daily. 11/11/19 04/27/20  Bernerd Limbo, CNM  FLUoxetine (PROZAC) 20 MG capsule Take 1 capsule (20 mg total) by mouth daily. 03/28/20 04/27/20  Dollene Cleveland, DO    Family History Family History  Problem Relation Age of Onset  . Diabetes Father   . Cancer Maternal  Aunt   . Anesthesia problems Neg Hx   . Hearing loss Neg Hx   . Stroke Neg Hx   . CAD Neg Hx     Social History Social History   Tobacco Use  . Smoking status: Former Smoker    Packs/day: 0.25    Years: 3.00    Pack years: 0.75    Types: Cigarettes    Quit date: 03/06/2019    Years since quitting: 1.1  . Smokeless tobacco: Never Used  Vaping Use  . Vaping Use: Never used  Substance Use Topics  . Alcohol use: No  . Drug use: No     Allergies   Patient has no known allergies.   Review of Systems Review of Systems See HPI  Physical Exam Triage Vital Signs ED Triage Vitals [04/27/20 1003]  Enc Vitals Group     BP (!) 146/98     Pulse Rate 93     Resp 19     Temp 98.1 F (36.7 C)     Temp Source Oral     SpO2 99 %     Weight      Height      Head Circumference      Peak Flow      Pain Score 0     Pain Loc      Pain Edu?      Excl. in GC?    No data found.  Updated Vital Signs BP (!) 146/98 (BP Location: Right Arm)   Pulse 93   Temp 98.1 F (36.7 C) (Oral)   Resp 19   SpO2 99%     Physical Exam Constitutional:      General: She is not in acute distress.    Appearance: She is well-developed and well-nourished.  HENT:     Head: Normocephalic and atraumatic.     Mouth/Throat:     Mouth: Oropharynx is clear and moist.  Eyes:     Conjunctiva/sclera: Conjunctivae normal.     Pupils: Pupils are equal, round, and reactive to light.  Cardiovascular:     Rate and Rhythm: Normal rate and regular rhythm.  Pulmonary:     Effort: Pulmonary effort is normal. No respiratory distress.     Breath sounds: Examination of the right-upper field reveals wheezing. Examination of the left-upper field reveals wheezing. Examination of the right-middle field reveals wheezing. Examination of the left-middle field reveals wheezing. Examination of the right-lower field reveals wheezing. Examination of the left-lower field reveals wheezing. Wheezing present. No rhonchi or  rales.  Chest:     Chest wall: No tenderness.  Abdominal:     General: There is no distension.  Palpations: Abdomen is soft.  Musculoskeletal:        General: No edema. Normal range of motion.     Cervical back: Normal range of motion.  Skin:    General: Skin is warm and dry.  Neurological:     General: No focal deficit present.     Mental Status: She is alert.  Psychiatric:        Behavior: Behavior normal.      UC Treatments / Results  Labs (all labs ordered are listed, but only abnormal results are displayed) Labs Reviewed - No data to display  EKG   Radiology No results found.  Procedures Procedures (including critical care time)  Medications Ordered in UC Medications  albuterol (VENTOLIN HFA) 108 (90 Base) MCG/ACT inhaler 2 puff (2 puffs Inhalation Given 04/27/20 1320)    Initial Impression / Assessment and Plan / UC Course  I have reviewed the triage vital signs and the nursing notes.  Pertinent labs & imaging results that were available during my care of the patient were reviewed by me and considered in my medical decision making (see chart for details).     Medicine refills are available at the pharmacy.  We will give her an albuterol treatment with an inhaler today, prednisone, follow-up with PCP Final Clinical Impressions(s) / UC Diagnoses   Final diagnoses:  Moderate persistent asthma with acute exacerbation     Discharge Instructions     Take prednisone 2 x a day Take 2 doses today Use the albuterol inhaler as needed Follow up with your PCP   ED Prescriptions    Medication Sig Dispense Auth. Provider   predniSONE (DELTASONE) 20 MG tablet Take 1 tablet (20 mg total) by mouth 2 (two) times daily with a meal. 10 tablet Eustace Moore, MD     PDMP not reviewed this encounter.   Eustace Moore, MD 04/27/20 [phone]

## 2020-04-27 NOTE — Discharge Instructions (Addendum)
Take prednisone 2 x a day Take 2 doses today Use the albuterol inhaler as needed Follow up with your PCP

## 2020-04-30 IMAGING — US US OB TRANSVAGINAL
1 series · 15 of 28 positions shown · non-contrast
Comparison: 02/22/2018

CLINICAL DATA: First trimester pregnancy with inconclusive fetal
viability. Unknown LMP.

EXAM:
TRANSVAGINAL OB ULTRASOUND
TECHNIQUE: Transvaginal ultrasound was performed for complete evaluation of the
gestation as well as the maternal uterus, adnexal regions, and
pelvic cul-de-sac.

[Series 1: us ob transvaginal · 15 of 32 slices shown]
[im 1/32]
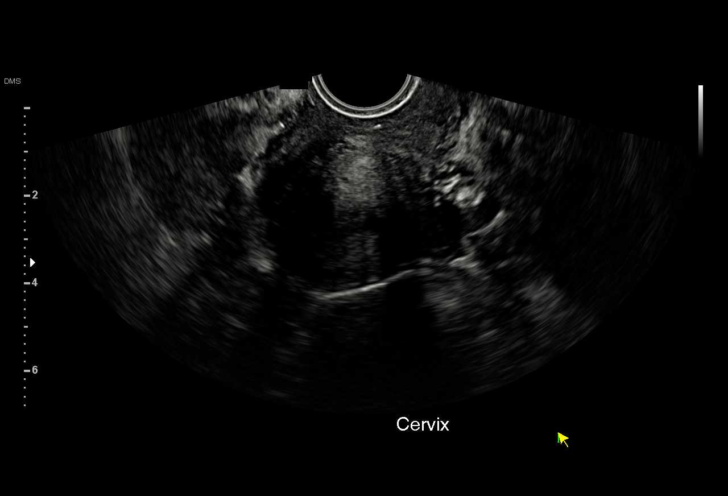
[im 3/32]
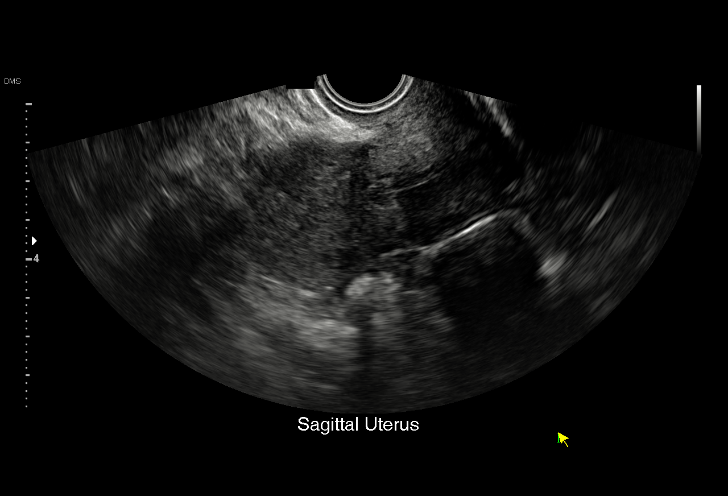
[im 5/32]
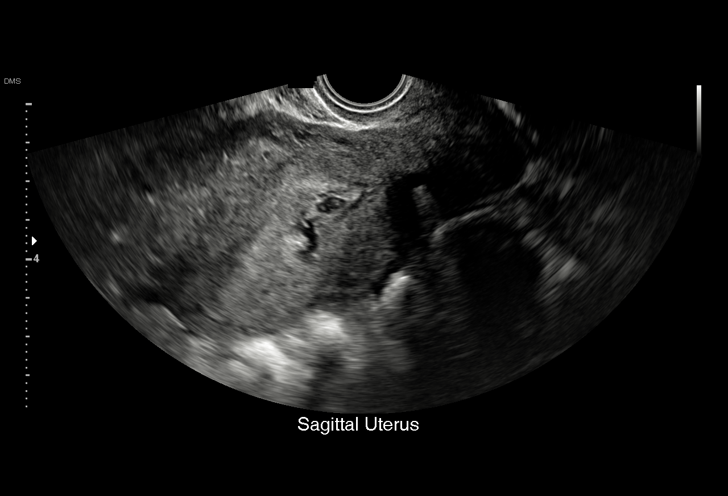
[im 7/32]
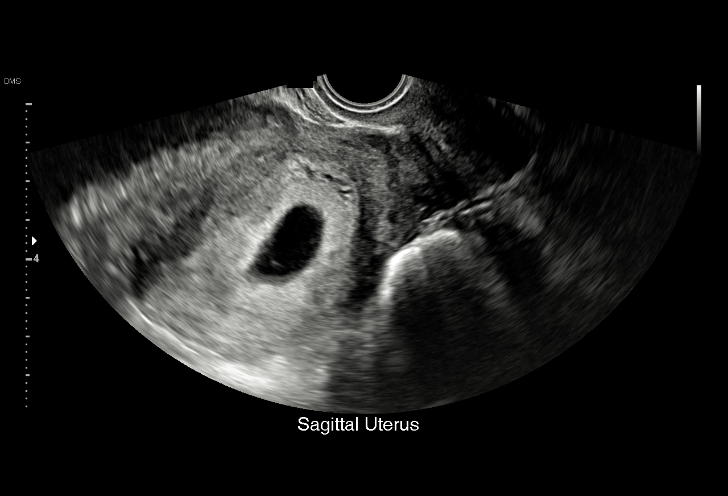
[im 10/32]
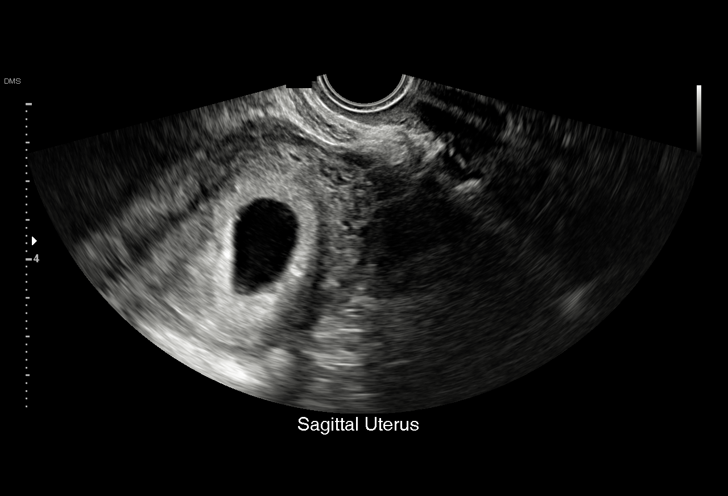
[im 12/32]
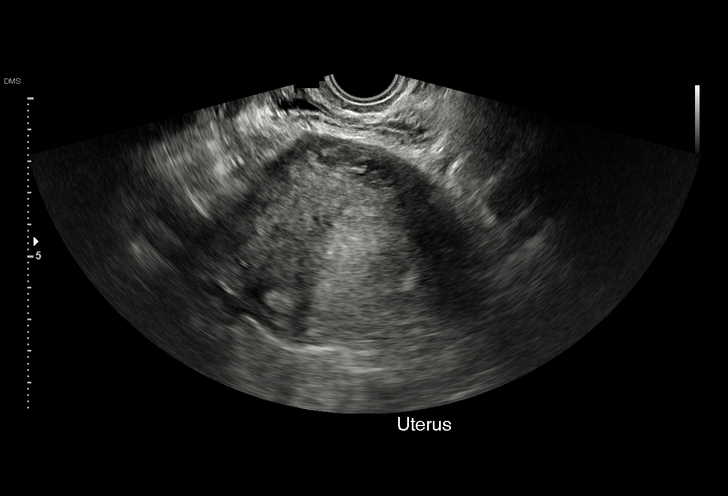
[im 14/32]
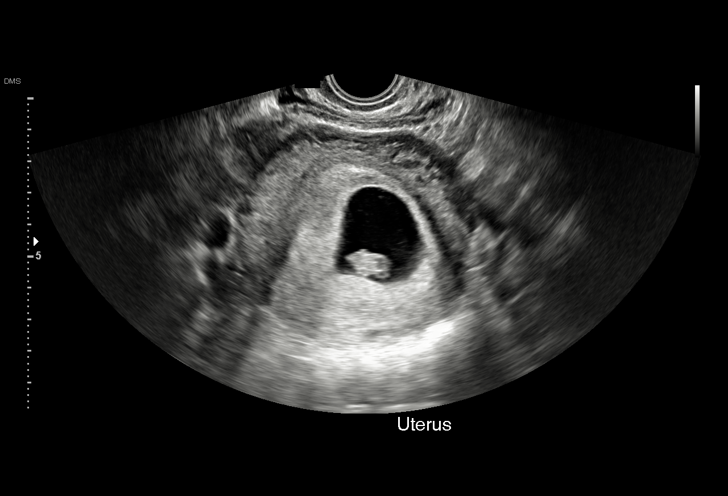
[im 17/32]
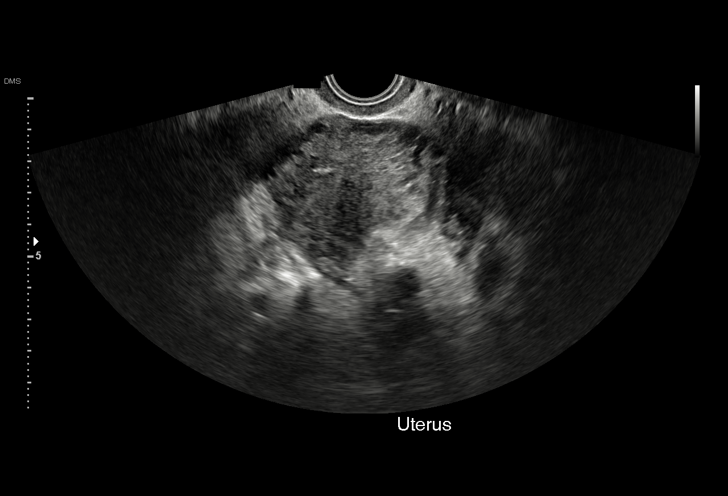
[im 18/32]
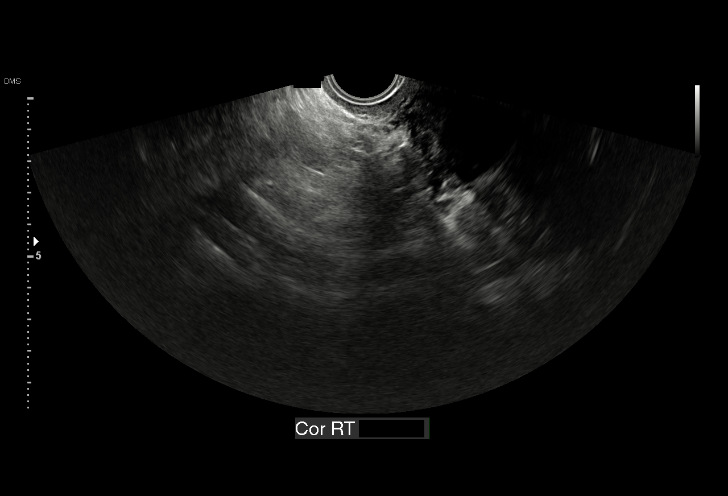
[im 20/32]
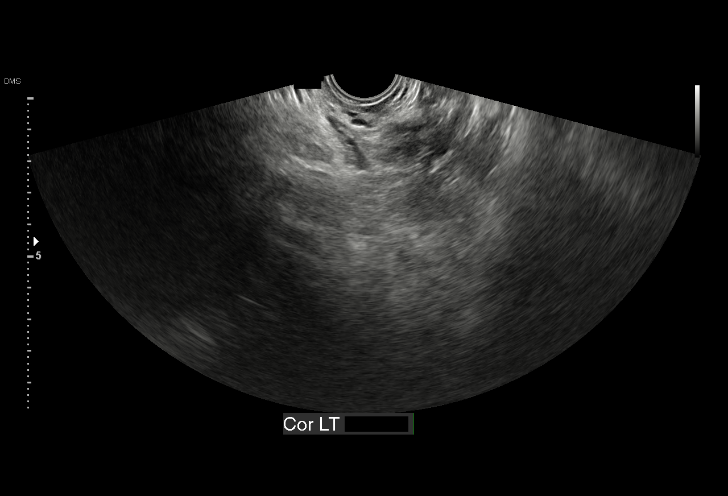
[im 22/32]
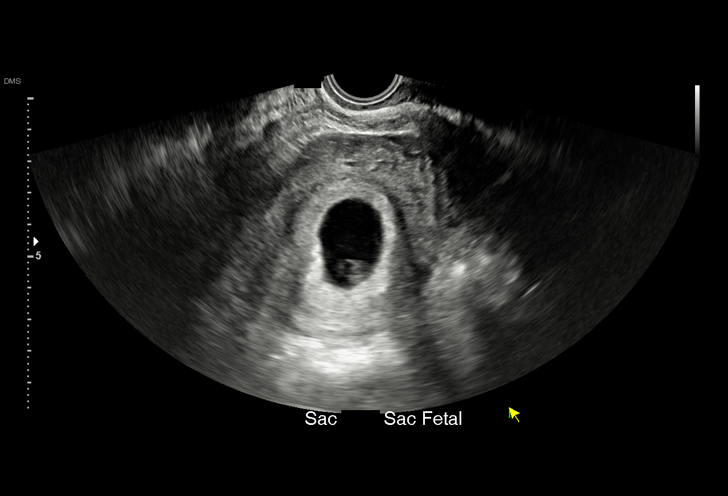
[im 25/32]
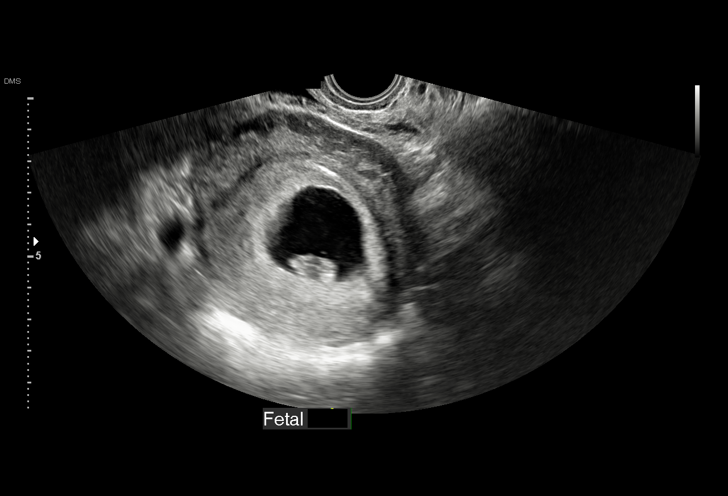
[im 27/32]
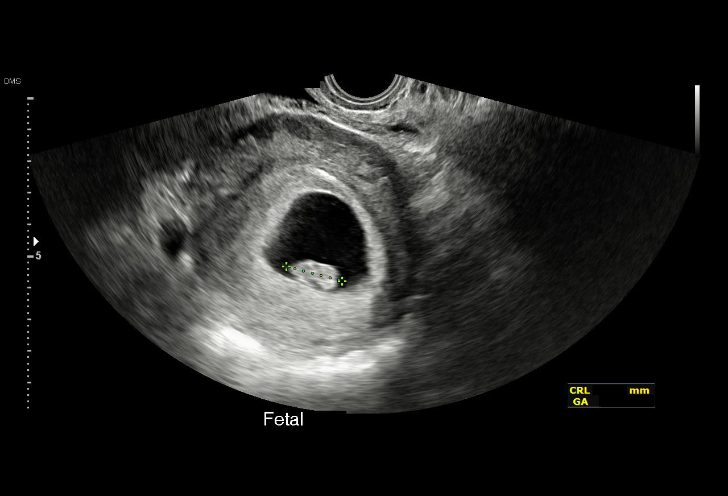
[im 29/32]
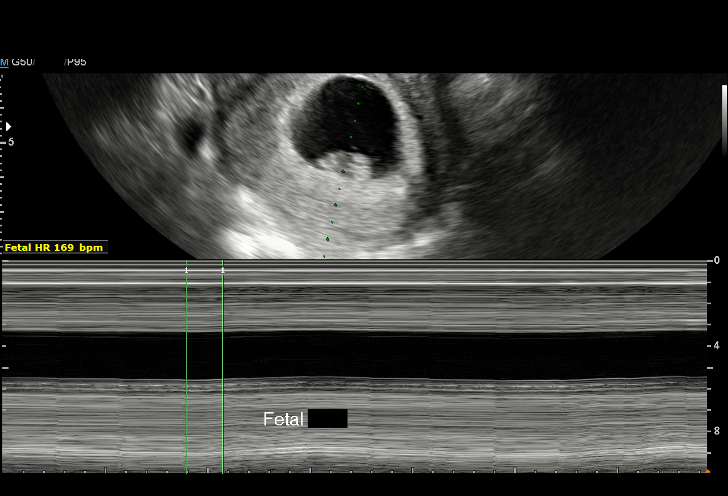
[im 32/32]
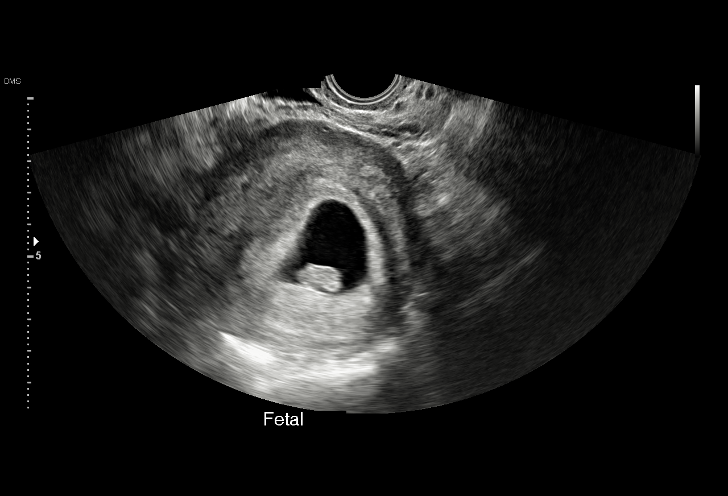

[15 of 28 positions shown; findings below may reference images not displayed]

FINDINGS: Intrauterine gestational sac: Single

Yolk sac:  Visualized.

Embryo:  Visualized.

Cardiac Activity: Visualized.

Heart Rate: 174 bpm

CRL:   18 mm   8 w 1 d                  US EDC: 10/18/2018

Subchorionic hemorrhage:  None visualized.

Maternal uterus/adnexae: Neither ovary directly visualized, however
no adnexal mass or abnormal free fluid identified.
IMPRESSION: Single living IUP measuring 8 weeks 1 day, with US EDC of
10/18/2018.

No significant maternal uterine or adnexal abnormality identified.

## 2020-06-05 ENCOUNTER — Telehealth: Payer: Self-pay

## 2020-06-05 DIAGNOSIS — L300 Nummular dermatitis: Secondary | ICD-10-CM

## 2020-06-05 NOTE — Telephone Encounter (Signed)
Patient calls nurse line requesting larger refill of kenalog ointment. Patient reports that she has been receiving refills in small tubes and that she would like to receive a larger jar as she is having to use large amount to cover areas.   Called pharmacy to see what quantity would be needed for larger jar. If appropriate, 473 grams would give patient the amount she is requesting.   Patient is also requesting referral to Dermatology. Please advise if referral could be placed or if patient could be evaluated in our dermatology clinic.   Veronda Prude, RN

## 2020-06-10 MED ORDER — TRIAMCINOLONE ACETONIDE 0.1 % EX OINT: 1.0000 "application " | TOPICAL_OINTMENT | Freq: Two times a day (BID) | CUTANEOUS | 1 refills | Status: DC

## 2020-06-10 NOTE — Telephone Encounter (Signed)
Received call from patient regarding her request for larger refill of Kenalog topical for her eczema.  I called the patient's pharmacy [facility] on [address] found that Kenalog 0.1% topical comes in a jar of 453.6 g.  I placed an order for this medication with 1 refill for the patient.  I am also happy to place a referral at this time for the patient's extensive eczema.  I called the patient, unfortunately there was no answer, but left a HIPAA compliant message stating that I refilled the medication for a larger size and will be placing the referral; however, would request that she comes into the office if anything gets worse as she might require a p.o. steroid.  Patient can schedule with [place] as needed.  Order placed for Kenalog 0.1% topical in the jar as well as referral to dermatology.   Peggyann Shoals, DO [facility], PGY-3 06/10/2020 10:28 AM

## 2020-06-14 NOTE — Progress Notes (Signed)
Patient was a no-show to her 06/16/2020 appt for follow up for eczema. Also had a no-showed appt 08/30/2019. Letter sent to patient's home informing her of no-show policy and missed appointments.   Peggyann Shoals, DO [facility], PGY-3 06/16/2020 8:53 PM

## 2020-06-16 ENCOUNTER — Ambulatory Visit (INDEPENDENT_AMBULATORY_CARE_PROVIDER_SITE_OTHER): Payer: Medicaid Other | Admitting: Family Medicine

## 2020-06-16 DIAGNOSIS — L309 Dermatitis, unspecified: Secondary | ICD-10-CM

## 2020-06-16 DIAGNOSIS — Z91199 Patient's noncompliance with other medical treatment and regimen due to unspecified reason: Secondary | ICD-10-CM | POA: Insufficient documentation

## 2020-06-16 DIAGNOSIS — Z5329 Procedure and treatment not carried out because of patient's decision for other reasons: Secondary | ICD-10-CM | POA: Insufficient documentation

## 2020-07-11 ENCOUNTER — Other Ambulatory Visit: Payer: Self-pay | Admitting: Family Medicine

## 2020-07-16 ENCOUNTER — Other Ambulatory Visit: Payer: Self-pay

## 2020-07-16 MED ORDER — ALBUTEROL SULFATE (2.5 MG/3ML) 0.083% IN NEBU: 5.0000 mg | INHALATION_SOLUTION | RESPIRATORY_TRACT | 1 refills | Status: DC | PRN

## 2020-08-27 ENCOUNTER — Ambulatory Visit (INDEPENDENT_AMBULATORY_CARE_PROVIDER_SITE_OTHER): Payer: Medicaid Other | Admitting: Family Medicine

## 2020-08-27 ENCOUNTER — Other Ambulatory Visit: Payer: Self-pay

## 2020-08-27 VITALS — BP 124/86 | HR 97 | Ht 64.0 in | Wt 276.4 lb

## 2020-08-27 DIAGNOSIS — N898 Other specified noninflammatory disorders of vagina: Secondary | ICD-10-CM | POA: Diagnosis present

## 2020-08-27 LAB — POCT WET PREP (WET MOUNT)
Clue Cells Wet Prep Whiff POC: NEGATIVE
Trichomonas Wet Prep HPF POC: ABSENT

## 2020-08-27 NOTE — Progress Notes (Signed)
    SUBJECTIVE:   CHIEF COMPLAINT / HPI:   Vagitis  Reports odor and history of BV. Last BV dx was a few months ago. Treated with improvement of symptoms. Declines STD testing today. Denies any discharge, genital lesions, dysuria, dyspareunia.  Is not using any contraception but would like counseling today. Does not desire future pregnancies.   Patient's last menstrual period was 08/11/2020 (approximate)..  PERTINENT  PMH / PSH:   OBJECTIVE:  BP 124/86   Pulse 97   Ht 5\' 4"  (1.626 m)   Wt 276 lb 6.4 oz (125.4 kg)   LMP 08/11/2020 (Approximate)   SpO2 97%   BMI 47.44 kg/m   General: well appearing, NAD GU: External vulva and vagina nonerythematous, without any obvious lesions or rash.  No abnormal discharge appreciated.  Normal ruggae of vaginal walls.  Cervix is non erythematous and non-friable.  There is no cervical motion tenderness, masses or gross abnormalities appreciated during bimanual exam.   ASSESSMENT/PLAN:   Vaginal odor Obtained wet prep. Negative for BV. Counseled on contraception options. Patient to make follow up appointment.    08/13/2020, MD [facility]

## 2020-08-27 NOTE — Assessment & Plan Note (Addendum)
Obtained wet prep. Negative for BV. Counseled on contraception options. Patient to make follow up appointment.

## 2020-08-28 NOTE — Progress Notes (Signed)
Called patient and mailbox full. If she calls the office, please let her know her testing was normal.

## 2020-11-05 ENCOUNTER — Other Ambulatory Visit (HOSPITAL_COMMUNITY)
Admission: RE | Admit: 2020-11-05 | Discharge: 2020-11-05 | Disposition: A | Discharge status: Home / Self Care | Payer: Medicaid Other | Source: Ambulatory Visit | Attending: Family Medicine | Admitting: Family Medicine

## 2020-11-05 ENCOUNTER — Other Ambulatory Visit: Payer: Self-pay

## 2020-11-05 ENCOUNTER — Encounter: Payer: Self-pay | Admitting: Student

## 2020-11-05 ENCOUNTER — Ambulatory Visit (INDEPENDENT_AMBULATORY_CARE_PROVIDER_SITE_OTHER): Payer: Medicaid Other | Admitting: Student

## 2020-11-05 VITALS — BP 127/94 | HR 95 | Ht 64.0 in | Wt 274.2 lb

## 2020-11-05 DIAGNOSIS — N898 Other specified noninflammatory disorders of vagina: Secondary | ICD-10-CM

## 2020-11-05 LAB — POCT WET PREP (WET MOUNT)
Clue Cells Wet Prep Whiff POC: NEGATIVE
Trichomonas Wet Prep HPF POC: ABSENT

## 2020-11-05 NOTE — Progress Notes (Signed)
    SUBJECTIVE:   CHIEF COMPLAINT / HPI:   Vaginal Odor: Patient is a 30 y.o. female presenting with vaginal odor for 7 days.  She endorses vaginal odor different from normal.  She is interested in screening for sexually transmitted infections today. Some discharge clear slimy. No itching or burning. No new partners.  Med rec--completed today  PERTINENT  PMH / PSH:   OBJECTIVE:   BP (!) 127/94   Pulse 95   Ht 5\' 4"  (1.626 m)   Wt 274 lb 3.2 oz (124.4 kg)   LMP 10/26/2020   SpO2 98%   BMI 47.07 kg/m    General: NAD, pleasant, able to participate in exam Respiratory: Normal effort, no obvious respiratory distress Pelvic: VULVA: normal appearing vulva with no masses, tenderness or lesions, VAGINA: Normal appearing vagina with normal color, no lesions, with some dried white discharge present, CERVIX: No lesions, white and thin discharge present,  Chaperone 12/27/2020, CMA and Dr. Cleatrice Burke present for pelvic exam  ASSESSMENT/PLAN:   Assessment:  30 y.o. female with vaginal odor for 7 days, as well as some clear discharge.  Physical exam significant for some thin white discharge.  Wet prep performed today.  Patient is interested in STI screening.    Plan: Vaginal odor Wet prep, HIV/RPR pending -Follow-up as needed  38, MD [facility]

## 2020-11-05 NOTE — Patient Instructions (Signed)
It was great to see you! Thank you for allowing me to participate in your care!   I recommend that you always bring your medications to each appointment as this makes it easy to ensure we are on the correct medications and helps [place] not miss when refills are needed.  Our plans for today:  - Let us know if there is worsening of your symptoms or you experience any fevers or abdominal pain.   We are checking some labs today, I will call you if they are abnormal will send you a MyChart message or a letter if they are normal.  If you do not hear about your labs in the next 2 weeks please let us know.  Take care and seek immediate care sooner if you develop any concerns.   Levin Erp, MD [facility]

## 2020-11-05 NOTE — Assessment & Plan Note (Signed)
Wet prep, HIV/RPR pending

## 2020-11-06 ENCOUNTER — Encounter: Payer: Self-pay | Admitting: Student

## 2020-11-06 ENCOUNTER — Telehealth: Payer: Self-pay | Admitting: Student

## 2020-11-06 LAB — HIV ANTIBODY (ROUTINE TESTING W REFLEX): HIV Screen 4th Generation wRfx: NONREACTIVE

## 2020-11-06 LAB — RPR: RPR Ser Ql: NONREACTIVE

## 2020-11-06 NOTE — Telephone Encounter (Signed)
Attempted to call patient about normal test results but no answer. We are waiting on the gc labs to result. Will send letter to patient.

## 2020-11-09 LAB — CERVICOVAGINAL ANCILLARY ONLY
Chlamydia: NEGATIVE
Comment: NEGATIVE
Comment: NEGATIVE
Comment: NORMAL
Neisseria Gonorrhea: NEGATIVE
Trichomonas: NEGATIVE

## 2020-11-11 MED ORDER — MONISTAT 1 COMBO PACK 1200 & 2 MG & % VA KIT: 1.0000 | PACK | Freq: Once | VAGINAL | 0 refills | Status: AC

## 2020-11-11 NOTE — Progress Notes (Signed)
Patient called and discussed negative STI test results. Patient still complains of vaginal odor with creamy discharge going on 2 months, with 2 negative screenings. I've sent in a prescription for monistat, 7 days for concern of an atypical yeast infection.

## 2020-11-11 NOTE — Addendum Note (Signed)
Addended by: Bess Kinds T on: 11/11/2020 03:44 PM   Modules accepted: Orders

## 2020-12-03 ENCOUNTER — Other Ambulatory Visit: Payer: Self-pay | Admitting: Family Medicine

## 2020-12-16 ENCOUNTER — Telehealth: Payer: Self-pay | Admitting: *Deleted

## 2020-12-16 NOTE — Telephone Encounter (Signed)
Patient lvm asking for a referral for therapy.  LM for patient just letting her know I am mailing resources and that she should make an appt to discuss concerns as this has not been addressed recently.  Krew Hortman,CMA

## 2021-01-18 ENCOUNTER — Ambulatory Visit: Payer: Medicaid Other

## 2021-02-09 ENCOUNTER — Other Ambulatory Visit: Payer: Self-pay | Admitting: Family Medicine

## 2021-05-04 ENCOUNTER — Encounter: Payer: Self-pay | Admitting: Family Medicine

## 2021-05-04 ENCOUNTER — Ambulatory Visit (INDEPENDENT_AMBULATORY_CARE_PROVIDER_SITE_OTHER): Payer: Medicaid Other | Admitting: Family Medicine

## 2021-05-04 ENCOUNTER — Other Ambulatory Visit: Payer: Self-pay

## 2021-05-04 VITALS — BP 130/83 | HR 85 | Wt 261.8 lb

## 2021-05-04 DIAGNOSIS — R0789 Other chest pain: Secondary | ICD-10-CM | POA: Diagnosis not present

## 2021-05-04 DIAGNOSIS — N898 Other specified noninflammatory disorders of vagina: Secondary | ICD-10-CM

## 2021-05-04 LAB — POCT WET PREP (WET MOUNT)
Clue Cells Wet Prep Whiff POC: NEGATIVE
Trichomonas Wet Prep HPF POC: ABSENT

## 2021-05-04 MED ORDER — LIDOCAINE 4 % EX CREA: 1.0000 "application " | TOPICAL_CREAM | CUTANEOUS | 0 refills | Status: DC | PRN

## 2021-05-04 NOTE — Progress Notes (Signed)
SUBJECTIVE:   CHIEF COMPLAINT / HPI:   Vaginal odor - Same complaint July 2022, wet prep negative, cervical vaginal swab negative for GC/CH, HIV and RPR negative at that time - Patient declines STI testing at this time, saying she just had some done - has been taking medication for yeast infection  Chest ache, back ache - years duration, constant - bilateral lower rib pain and back pain over lowest ribs - wonders if due to asthma - describes as ache - worst when waking up in morning - better when she gets up and moves around  - has not tried any pain relievers, heat, or ice - sometimes associated with SOB - sleeps on stomach or side - no noticeable increase in breast tissue - no history of trauma, injury - uncle just died a couple months ago from asthma attack and heart attack?  PERTINENT  PMH / PSH:  Patient Active Problem List   Diagnosis Date Noted   Musculoskeletal chest pain 05/05/2021   Vaginal odor 08/27/2020   No-show for appointment 06/16/2020   BMI 40.0-44.9, adult (HCC) 05/15/2018   Depression 11/14/2011   TOBACCO USER 12/27/2008   Morbid obesity (HCC) 06/15/2006   RHINITIS, ALLERGIC 06/15/2006   Asthma 06/15/2006   Eczema 06/15/2006    OBJECTIVE:   BP 130/83    Pulse 85    Wt 261 lb 12.8 oz (118.8 kg)    SpO2 98%    BMI 44.94 kg/m    PHQ-9:  Depression screen [facility] 2/9 05/04/2021 11/05/2020 08/27/2020  Decreased Interest 1 0 0  Down, Depressed, Hopeless 0 0 0  PHQ - 2 Score 1 0 0  Altered sleeping 1 0 0  Tired, decreased energy 3 0 0  Change in appetite 1 0 0  Feeling bad or failure about yourself  0 0 0  Trouble concentrating 0 0 0  Moving slowly or fidgety/restless 0 0 0  Suicidal thoughts 0 0 0  PHQ-9 Score 6 0 0  Difficult doing work/chores - Not difficult at all -  Some recent data might be hidden     GAD-7:  GAD 7 : Generalized Anxiety Score 11/19/2019 10/29/2019 10/07/2019 09/06/2019  Nervous, Anxious, on Edge 0 0 0 0  Control/stop worrying  0 0 0 0  Worry too much - different things 0 0 0 0  Trouble relaxing 0 0 0 0  Restless 0 0 0 0  Easily annoyed or irritable 0 0 0 0  Afraid - awful might happen 0 0 0 0  Total GAD 7 Score 0 0 0 0     Physical Exam General: Awake, alert, oriented Cardiovascular: Regular rate and rhythm, S1 and S2 present, no murmurs auscultated Respiratory: Lung fields clear to auscultation bilaterally Extremities: No bilateral lower extremity edema, palpable pedal and pretibial pulses bilaterally Neuro: Cranial nerves II through X grossly intact, able to move all extremities spontaneously Vulva: Normal appearing vulva without rashes, lesions, or deformities Vagina: Pale pink rugated vaginal tissue without obvious lesions, physiologic discharge of whitish color, cervix without lesion or overt tenderness with swab  ASSESSMENT/PLAN:   Vaginal odor Wet prep negative. STI screening declined. No medications indicated at this time. Patient wondering why odor is persistent if no infection. Discussed hygiene measures such as plain unscented soap, no douching, etc.   Musculoskeletal chest pain Chronic, constant. No red flag symptoms. No EKG indicated at this time. Most likely musculoskeletal in nature. Recommend tylenol and lidocaine cream PRN. Return precautions discussed.  See AVS for more.      Fayette Pho, MD [facility]

## 2021-05-04 NOTE — Patient Instructions (Addendum)
It was wonderful to meet you today. Thank you for allowing me to be a part of your care. Below is a short summary of what we discussed at your visit today:  Vaginal Odor Today we did a vaginal exam and collected a sample that we looked at under the microscope.  If the results are normal, I will send you a letter or MyChart message. If the results are abnormal, I will give you a call.    Chest Pain This chest pain sounds like muscle pain in origin.  Try taking Tylenol and seeing if this works.  I have also sent in a prescription for a topical lidocaine cream that you can apply to the affected areas as needed.  Please let us know if this does not work for you.  Health Maintenance We like to think about ways to keep you healthy for years to come. Below are some interventions and screenings we can offer to keep you healthy: - COVID vaccine - annual flu vaccine    Please bring all of your medications to every appointment!  If you have any questions or concerns, please do not hesitate to contact us via phone or MyChart message.   Fayette Pho, MD

## 2021-05-05 DIAGNOSIS — R0789 Other chest pain: Secondary | ICD-10-CM | POA: Insufficient documentation

## 2021-05-05 NOTE — Assessment & Plan Note (Signed)
Chronic, constant. No red flag symptoms. No EKG indicated at this time. Most likely musculoskeletal in nature. Recommend tylenol and lidocaine cream PRN. Return precautions discussed. See AVS for more.

## 2021-05-05 NOTE — Assessment & Plan Note (Signed)
Wet prep negative. STI screening declined. No medications indicated at this time. Patient wondering why odor is persistent if no infection. Discussed hygiene measures such as plain unscented soap, no douching, etc.

## 2021-05-31 ENCOUNTER — Other Ambulatory Visit: Payer: Self-pay | Admitting: Family Medicine

## 2021-06-29 ENCOUNTER — Other Ambulatory Visit: Payer: Self-pay | Admitting: Family Medicine

## 2021-07-24 ENCOUNTER — Other Ambulatory Visit: Payer: Self-pay

## 2021-07-24 ENCOUNTER — Emergency Department (HOSPITAL_COMMUNITY)
Admission: EM | Admit: 2021-07-24 | Discharge: 2021-07-24 | Disposition: A | Discharge status: Home / Self Care | Payer: Medicaid Other | Attending: Emergency Medicine | Admitting: Emergency Medicine

## 2021-07-24 ENCOUNTER — Encounter (HOSPITAL_COMMUNITY): Payer: Self-pay | Admitting: *Deleted

## 2021-07-24 DIAGNOSIS — J029 Acute pharyngitis, unspecified: Secondary | ICD-10-CM | POA: Diagnosis not present

## 2021-07-24 LAB — GROUP A STREP BY PCR: Group A Strep by PCR: NOT DETECTED

## 2021-07-24 MED ORDER — LIDOCAINE VISCOUS HCL 2 % MT SOLN
15.0000 mL | Freq: Once | OROMUCOSAL | Status: AC
  Administered 2021-07-24: 15 mL via ORAL
  Filled 2021-07-24: qty 15

## 2021-07-24 MED ORDER — DULERA 100-5 MCG/ACT IN AERO: 2.0000 | INHALATION_SPRAY | Freq: Two times a day (BID) | RESPIRATORY_TRACT | 0 refills | Status: DC

## 2021-07-24 NOTE — ED Triage Notes (Signed)
3 days sore throat, eye puffiness from pollen ?
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
## 2021-07-24 NOTE — Discharge Instructions (Signed)
Please read and follow all provided instructions. ? ?Your diagnoses today include:  ?1. Sore throat   ? ? ?Tests performed today include: ?Strep test: was negative for strep throat ?Vital signs. See below for your results today.  ? ?Medications prescribed:  ?Steroid inhaler refill ? ?Home care instructions:  ?Please read the educational materials provided and follow any instructions contained in this packet. ? ?Follow-up instructions: ?Please follow-up with your primary care provider as needed for further evaluation of your symptoms. ? ?Return instructions:  ?Please return to the Emergency Department if you experience worsening symptoms.  ?Return if you have worsening problems swallowing, your neck becomes swollen, you cannot swallow your saliva or your voice becomes muffled.  ?Return with high persistent fever, persistent vomiting, or if you have trouble breathing.  ?Please return if you have any other emergent concerns. ? ?Additional Information: ? ?Your vital signs today were: ?BP (!) 146/78 (BP Location: Left Arm)   Pulse 93   Temp 98.4 ?F (36.9 ?C) (Oral)   Resp 18   Ht 5\' 3"  (1.6 m)   Wt 72.6 kg   SpO2 97%   BMI 28.34 kg/m?  ?If your blood pressure (BP) was elevated above 135/85 this visit, please have this repeated by your doctor within one month. ?-------------- ? ?

## 2021-09-17 ENCOUNTER — Other Ambulatory Visit: Payer: Self-pay

## 2021-09-17 DIAGNOSIS — L300 Nummular dermatitis: Secondary | ICD-10-CM

## 2021-09-18 MED ORDER — TRIAMCINOLONE ACETONIDE 0.1 % EX OINT: 1.0000 "application " | TOPICAL_OINTMENT | Freq: Two times a day (BID) | CUTANEOUS | 1 refills | Status: DC

## 2021-09-18 MED ORDER — DULERA 100-5 MCG/ACT IN AERO: 2.0000 | INHALATION_SPRAY | Freq: Two times a day (BID) | RESPIRATORY_TRACT | 0 refills | Status: DC

## 2021-09-21 ENCOUNTER — Encounter: Payer: Self-pay | Admitting: *Deleted

## 2021-10-01 ENCOUNTER — Other Ambulatory Visit: Payer: Self-pay | Admitting: Family Medicine

## 2021-10-01 NOTE — Telephone Encounter (Signed)
Patient called needing refill on her Dulera. It was just filled but she states she lost it and the pharmacy told her she would have to call us to get refill.  Please advise.  Thanks!

## 2021-10-02 ENCOUNTER — Other Ambulatory Visit: Payer: Self-pay | Admitting: Family Medicine

## 2021-10-03 MED ORDER — DULERA 100-5 MCG/ACT IN AERO: 2.0000 | INHALATION_SPRAY | Freq: Two times a day (BID) | RESPIRATORY_TRACT | 2 refills | Status: DC

## 2021-10-16 ENCOUNTER — Other Ambulatory Visit: Payer: Self-pay | Admitting: Family Medicine

## 2021-10-18 NOTE — Telephone Encounter (Signed)
Last refill 2 weeks ago, not yet indicated

## 2021-12-03 ENCOUNTER — Emergency Department (HOSPITAL_COMMUNITY)
Admission: EM | Admit: 2021-12-03 | Discharge: 2021-12-04 | Discharge status: Against Medical Advice | Payer: Medicaid Other | Attending: Emergency Medicine | Admitting: Emergency Medicine

## 2021-12-03 ENCOUNTER — Encounter (HOSPITAL_COMMUNITY): Payer: Self-pay

## 2021-12-03 ENCOUNTER — Other Ambulatory Visit: Payer: Self-pay

## 2021-12-03 ENCOUNTER — Emergency Department (HOSPITAL_COMMUNITY): Payer: Medicaid Other

## 2021-12-03 DIAGNOSIS — Z5321 Procedure and treatment not carried out due to patient leaving prior to being seen by health care provider: Secondary | ICD-10-CM | POA: Insufficient documentation

## 2021-12-03 DIAGNOSIS — M549 Dorsalgia, unspecified: Secondary | ICD-10-CM | POA: Insufficient documentation

## 2021-12-03 DIAGNOSIS — R0789 Other chest pain: Secondary | ICD-10-CM | POA: Diagnosis present

## 2021-12-03 DIAGNOSIS — R1011 Right upper quadrant pain: Secondary | ICD-10-CM | POA: Diagnosis not present

## 2021-12-03 LAB — COMPREHENSIVE METABOLIC PANEL
ALT: 14 U/L (ref 0–44)
AST: 14 U/L — ABNORMAL LOW (ref 15–41)
Albumin: 3.9 g/dL (ref 3.5–5.0)
Alkaline Phosphatase: 49 U/L (ref 38–126)
Anion gap: 7 (ref 5–15)
BUN: 16 mg/dL (ref 6–20)
CO2: 25 mmol/L (ref 22–32)
Calcium: 8.9 mg/dL (ref 8.9–10.3)
Chloride: 107 mmol/L (ref 98–111)
Creatinine, Ser: 0.96 mg/dL (ref 0.44–1.00)
GFR, Estimated: >60 mL/min (ref >60)
Glucose, Bld: 100 mg/dL — ABNORMAL HIGH (ref 70–99)
Potassium: 3.9 mmol/L (ref 3.5–5.1)
Sodium: 139 mmol/L (ref 135–145)
Total Bilirubin: 0.3 mg/dL (ref 0.3–1.2)
Total Protein: 7.4 g/dL (ref 6.5–8.1)

## 2021-12-03 LAB — D-DIMER, QUANTITATIVE: D-Dimer, Quant: 0.49 ug/mL-FEU (ref 0.00–0.50)

## 2021-12-03 LAB — I-STAT BETA HCG BLOOD, ED (MC, WL, AP ONLY): I-stat hCG, quantitative: <5 m[IU]/mL (ref <5)

## 2021-12-03 LAB — CBC
HCT: 36.1 % (ref 36.0–46.0)
Hemoglobin: 11.3 g/dL — ABNORMAL LOW (ref 12.0–15.0)
MCH: 24.6 pg — ABNORMAL LOW (ref 26.0–34.0)
MCHC: 31.3 g/dL (ref 30.0–36.0)
MCV: 78.6 fL — ABNORMAL LOW (ref 80.0–100.0)
Platelets: 329 10*3/uL (ref 150–400)
RBC: 4.59 MIL/uL (ref 3.87–5.11)
RDW: 15.9 % — ABNORMAL HIGH (ref 11.5–15.5)
WBC: 7.1 10*3/uL (ref 4.0–10.5)
nRBC: 0 % (ref 0.0–0.2)

## 2021-12-03 LAB — LIPASE, BLOOD: Lipase: 28 U/L (ref 11–51)

## 2021-12-03 LAB — TROPONIN I (HIGH SENSITIVITY): Troponin I (High Sensitivity): 2 ng/L (ref <18)

## 2021-12-03 NOTE — ED Provider Triage Note (Signed)
Emergency Medicine Provider Triage Evaluation Note  Blessen Kimbrough , a 31 y.o. female  was evaluated in triage.  Pt complains of right lower chest pain.  Pain has been present since 6 AM this morning.  She reports it is worse when she leans back and worse with a deep breath.  No associated nausea, vomiting, diarrhea, constipation, hematuria.  She reports that it starts under her right breast and wraps around into her right back.  Review of Systems  Positive: Chest pain, back pain Negative: Abdominal pain, flank pain, dysuria, nausea, vomiting, diarrhea  Physical Exam  BP (!) 128/108 (BP Location: Right Arm)   Pulse 87   Temp 98.2 F (36.8 C) (Oral)   Resp 16   SpO2 98%  Gen:   Awake, no distress   Resp:  Normal effort, tenderness over the right lower chest wall, breath sounds present and equal bilaterally MSK:   Moves extremities without difficulty  Other:    Medical Decision Making  Medically screening exam initiated at 7:10 PM.  Appropriate orders placed.  Canesha Tesfaye was informed that the remainder of the evaluation will be completed by another provider, this initial triage assessment does not replace that evaluation, and the importance of remaining in the ED until their evaluation is complete.     Dartha Lodge, [place] 12/03/21 1918

## 2021-12-03 NOTE — ED Triage Notes (Signed)
Pt BIB EMS from home. Pt reports RUQ that radiates to R flank since 6am this morning.   HR 92 RR 16 BP 148/98

## 2021-12-04 NOTE — ED Notes (Signed)
Pt called x2 in ED lobby. Pt could not be located within ED lobby, and did not respond to name call.   °

## 2021-12-04 NOTE — ED Notes (Signed)
Pt called x1 in ED lobby. Pt could not be located within ED lobby, and did not respond to name call.   °

## 2021-12-04 NOTE — ED Notes (Signed)
Patient called multiple times for room placement.  No answer x 3

## 2022-01-27 ENCOUNTER — Other Ambulatory Visit: Payer: Self-pay | Admitting: Family Medicine

## 2022-01-27 DIAGNOSIS — L300 Nummular dermatitis: Secondary | ICD-10-CM

## 2022-02-15 ENCOUNTER — Ambulatory Visit: Payer: Medicaid Other | Admitting: Family Medicine

## 2022-03-07 ENCOUNTER — Ambulatory Visit: Payer: Medicaid Other

## 2022-03-14 ENCOUNTER — Ambulatory Visit: Payer: Medicaid Other

## 2022-03-26 ENCOUNTER — Other Ambulatory Visit: Payer: Self-pay | Admitting: Student

## 2022-03-28 NOTE — Telephone Encounter (Signed)
Asthma h/o, stable on [facility]

## 2022-04-16 DIAGNOSIS — J45901 Unspecified asthma with (acute) exacerbation: Secondary | ICD-10-CM | POA: Insufficient documentation

## 2022-04-16 DIAGNOSIS — Z7952 Long term (current) use of systemic steroids: Secondary | ICD-10-CM | POA: Diagnosis not present

## 2022-04-17 ENCOUNTER — Emergency Department (HOSPITAL_COMMUNITY)
Admission: EM | Admit: 2022-04-17 | Discharge: 2022-04-17 | Disposition: A | Discharge status: Home / Self Care | Payer: Medicaid Other | Attending: Emergency Medicine | Admitting: Emergency Medicine

## 2022-04-17 ENCOUNTER — Other Ambulatory Visit: Payer: Self-pay

## 2022-04-17 DIAGNOSIS — J45901 Unspecified asthma with (acute) exacerbation: Secondary | ICD-10-CM

## 2022-04-17 MED ORDER — FLUTICASONE PROPIONATE 50 MCG/ACT NA SUSP: 2.0000 | Freq: Every day | NASAL | 0 refills | Status: DC

## 2022-04-17 MED ORDER — PREDNISONE 20 MG PO TABS
60.0000 mg | ORAL_TABLET | Freq: Once | ORAL | Status: AC
  Administered 2022-04-17: 60 mg via ORAL
  Filled 2022-04-17: qty 3

## 2022-04-17 MED ORDER — LORATADINE 10 MG PO TABS: 10.0000 mg | ORAL_TABLET | Freq: Every day | ORAL | 0 refills | Status: DC

## 2022-04-17 MED ORDER — PREDNISONE 20 MG PO TABS: 40.0000 mg | ORAL_TABLET | Freq: Every day | ORAL | 0 refills | Status: DC

## 2022-04-17 MED ORDER — ALBUTEROL SULFATE HFA 108 (90 BASE) MCG/ACT IN AERS
2.0000 | INHALATION_SPRAY | RESPIRATORY_TRACT | Status: DC | PRN
  Administered 2022-04-17: 2 via RESPIRATORY_TRACT
  Filled 2022-04-17: qty 6.7

## 2022-04-17 NOTE — ED Provider Notes (Signed)
[facility]-EMERGENCY DEPT Provider Note   CSN: [id number] Arrival date & time: 04/16/22  2303     History  Chief Complaint  Patient presents with   Asthma    Gail Walker is a 31 y.o. female.  Patient presents for evaluation of chest tightness and shortness of breath.  Patient became sick with a flulike illness about 6 days ago.  This included cough, nasal congestion, sneezing, and fevers.  Symptoms are getting better however the chest tightness and wheezing, increased use of home albuterol, is persistent.  No current fevers, vomiting.  Symptoms consistent with previous asthma exacerbation.  She is requesting prednisone.  States that in the past she was on Zyrtec and Flonase for allergy symptoms.       Home Medications Prior to Admission medications   Medication Sig Start Date End Date Taking? Authorizing Provider  fluticasone (FLONASE) 50 MCG/ACT nasal spray Place 2 sprays into both nostrils daily. 04/17/22  Yes Renne Crigler, PA-C  loratadine (CLARITIN) 10 MG tablet Take 1 tablet (10 mg total) by mouth daily. 04/17/22  Yes Renne Crigler, PA-C  predniSONE (DELTASONE) 20 MG tablet Take 2 tablets (40 mg total) by mouth daily. 04/17/22  Yes Renne Crigler, PA-C  acetaminophen (TYLENOL) 325 MG tablet Take 650 mg by mouth every 6 (six) hours as needed for mild pain or headache. Patient not taking: Reported on 11/05/2020    [provider]  albuterol (PROVENTIL) (2.5 MG/3ML) 0.083% nebulizer solution Take 6 mLs (5 mg total) by nebulization every 4 (four) hours as needed for wheezing or shortness of breath. 07/16/20   Peggyann Shoals C, DO  DULERA 100-5 MCG/ACT AERO INHALE 2 PUFFS INTO THE LUNGS IN THE MORNING AND AT BEDTIME. 03/28/22   [name], MD  triamcinolone ointment (KENALOG) 0.1 % APPLY 1 APPLICATION TOPICALLY TWICE A DAY 01/28/22   Maxwell, Allee, MD  VENTOLIN HFA 108 (90 Base) MCG/ACT inhaler INHALE 2 PUFFS BY MOUTH EVERY 4 HOURS AS  NEEDED FOR WHEEZE OR FOR SHORTNESS OF BREATH 10/04/21   Brimage, Vondra, DO  amLODipine (NORVASC) 2.5 MG tablet Take 1 tablet (2.5 mg total) by mouth daily. 11/11/19 04/27/20  Bernerd Limbo, CNM  FLUoxetine (PROZAC) 20 MG capsule Take 1 capsule (20 mg total) by mouth daily. 03/28/20 04/27/20  Dollene Cleveland, DO      Allergies    Patient has no known allergies.    Review of Systems   Review of Systems  Physical Exam Updated Vital Signs BP (!) 105/94 (BP Location: Left Arm)   Pulse (!) 111   Temp 97.9 F (36.6 C) (Oral)   Resp 18   Ht 5\' 3"  (1.6 m)   Wt 117.9 kg   SpO2 100%   BMI 46.06 kg/m  Physical Exam Vitals and nursing note reviewed.  Constitutional:      Appearance: She is well-developed.  HENT:     Head: Normocephalic and atraumatic.     Jaw: No trismus.     Right Ear: Tympanic membrane, ear canal and external ear normal.     Left Ear: Tympanic membrane, ear canal and external ear normal.     Nose: Congestion present. No mucosal edema or rhinorrhea.     Mouth/Throat:     Mouth: Mucous membranes are moist. Mucous membranes are not dry. No oral lesions.     Pharynx: Uvula midline. No oropharyngeal exudate, posterior oropharyngeal erythema or uvula swelling.     Tonsils: No tonsillar abscesses.  Eyes:  General:        Right eye: No discharge.        Left eye: No discharge.     Conjunctiva/sclera: Conjunctivae normal.  Cardiovascular:     Rate and Rhythm: Normal rate and regular rhythm.     Heart sounds: Normal heart sounds.  Pulmonary:     Effort: Pulmonary effort is normal. No respiratory distress.     Breath sounds: Normal breath sounds. No wheezing or rales.  Abdominal:     Palpations: Abdomen is soft.     Tenderness: There is no abdominal tenderness.  Musculoskeletal:     Cervical back: Normal range of motion and neck supple.  Lymphadenopathy:     Cervical: No cervical adenopathy.  Skin:    General: Skin is warm and dry.  Neurological:     Mental  Status: She is alert.  Psychiatric:        Mood and Affect: Mood normal.     ED Results / Procedures / Treatments   Labs (all labs ordered are listed, but only abnormal results are displayed) Labs Reviewed - No data to display  EKG None  Radiology No results found.  Procedures Procedures    Medications Ordered in ED Medications  albuterol (VENTOLIN HFA) 108 (90 Base) MCG/ACT inhaler 2 puff (has no administration in time range)  predniSONE (DELTASONE) tablet 60 mg (has no administration in time range)    ED Course/ Medical Decision Making/ A&P    Patient seen and examined. History obtained directly from patient. Work-up including labs, imaging, EKG ordered in triage, if performed, were reviewed.    Labs/EKG: None ordered  Imaging: None ordered  Medications/Fluids: Ordered: Albuterol, prednisone  Most recent vital signs reviewed and are as follows: BP (!) 105/94 (BP Location: Left Arm)   Pulse (!) 111   Temp 97.9 F (36.6 C) (Oral)   Resp 18   Ht 5\' 3"  (1.6 m)   Wt 117.9 kg   SpO2 100%   BMI 46.06 kg/m   Initial impression: Asthma exacerbation after recent flulike illness  Home treatment plan: Albuterol every 4 hours as needed for chest tightness, shortness of breath or cough, prednisone, Flonase and loratadine.  Return instructions discussed with patient: Return with worsening shortness of breath, increased work of breathing, new symptoms or other concerns.  Follow-up instructions discussed with patient: Follow-up with PCP in 5 to 7 days if not improving                           Medical Decision Making Risk OTC drugs. Prescription drug management.   Patient with chest tightness, shortness of breath consistent with asthma.  She recently had a flulike illness which has caused worsening control of her asthma.  Her lungs are clear to auscultation.  I have low clinical concern for pneumonia, PE, ACS.  She looks well otherwise.        Final Clinical  Impression(s) / ED Diagnoses Final diagnoses:  Exacerbation of asthma, unspecified asthma severity, unspecified whether persistent    Rx / DC Orders ED Discharge Orders          Ordered    predniSONE (DELTASONE) 20 MG tablet  Daily        04/17/22 0351    loratadine (CLARITIN) 10 MG tablet  Daily        04/17/22 0351    fluticasone (FLONASE) 50 MCG/ACT nasal spray  Daily  04/17/22 0351              Renne Crigler, PA-C 04/17/22 0355    Nira Conn, MD 04/17/22 [phone]

## 2022-04-17 NOTE — ED Triage Notes (Signed)
Patient here from home reporting asthma exacerbation. Reports using inhaler more than normal. "I think I need steroids".

## 2022-04-17 NOTE — Discharge Instructions (Signed)
Please read and follow all provided instructions.  Your diagnoses today include:  1. Exacerbation of asthma, unspecified asthma severity, unspecified whether persistent     Tests performed today include: Vital signs. See below for your results today.   Medications prescribed:  Albuterol inhaler - medication that opens up your airway  Use inhaler as follows: 1-2 puffs with spacer every 4 hours as needed for wheezing, cough, or shortness of breath.   Prednisone - steroid medicine   It is best to take this medication in the morning to prevent sleeping problems. If you are diabetic, monitor your blood sugar closely and stop taking Prednisone if blood sugar is over 300. Take with food to prevent stomach upset.   Take any prescribed medications only as directed.  Home care instructions:  Follow any educational materials contained in this packet.  Follow-up instructions: Please follow-up with your primary care provider in the next 7 days for further evaluation of your symptoms and management of your asthma.  Return instructions:  Please return to the Emergency Department if you experience worsening symptoms. Please return with worsening wheezing, shortness of breath, or difficulty breathing. Return with persistent fever above 101F.  Please return if you have any other emergent concerns.  Additional Information:  Your vital signs today were: BP (!) 105/94 (BP Location: Left Arm)   Pulse (!) 111   Temp 97.9 F (36.6 C) (Oral)   Resp 18   Ht 5\' 3"  (1.6 m)   Wt 117.9 kg   SpO2 100%   BMI 46.06 kg/m  If your blood pressure (BP) was elevated above 135/85 this visit, please have this repeated by your doctor within one month. --------------

## 2022-06-09 ENCOUNTER — Other Ambulatory Visit: Payer: Self-pay | Admitting: Student

## 2022-06-09 DIAGNOSIS — L300 Nummular dermatitis: Secondary | ICD-10-CM

## 2022-07-01 ENCOUNTER — Ambulatory Visit: Payer: Medicaid Other | Admitting: Student

## 2022-07-18 ENCOUNTER — Other Ambulatory Visit: Payer: Self-pay | Admitting: Family Medicine

## 2022-07-18 ENCOUNTER — Other Ambulatory Visit: Payer: Self-pay | Admitting: Student

## 2022-09-23 ENCOUNTER — Other Ambulatory Visit: Payer: Self-pay

## 2022-09-23 MED ORDER — DULERA 100-5 MCG/ACT IN AERO: 2.0000 | INHALATION_SPRAY | Freq: Two times a day (BID) | RESPIRATORY_TRACT | 1 refills | Status: DC

## 2022-10-25 ENCOUNTER — Telehealth: Payer: Self-pay

## 2022-10-25 NOTE — Telephone Encounter (Addendum)
Patient calls nurse line requesting to schedule appointment for heaviness/tightness in chest. States that feeling is across entire chest.  Duration: 3 weeks  Denies radiation of pain, arm pain, jaw tightness, nausea or vomiting. No current difficulty breathing.  Patient is able to speak in complete sentences.   Scheduled for tomorrow afternoon. Discussed ED precautions. Patient verbalizes understanding.   Veronda Prude, RN

## 2022-10-26 ENCOUNTER — Ambulatory Visit (INDEPENDENT_AMBULATORY_CARE_PROVIDER_SITE_OTHER): Payer: Medicaid Other | Admitting: Family Medicine

## 2022-10-26 ENCOUNTER — Encounter: Payer: Self-pay | Admitting: Family Medicine

## 2022-10-26 VITALS — BP 125/88 | HR 97 | Ht 64.0 in | Wt 272.0 lb

## 2022-10-26 DIAGNOSIS — R0789 Other chest pain: Secondary | ICD-10-CM | POA: Diagnosis present

## 2022-10-26 DIAGNOSIS — J4540 Moderate persistent asthma, uncomplicated: Secondary | ICD-10-CM | POA: Diagnosis not present

## 2022-10-26 DIAGNOSIS — R030 Elevated blood-pressure reading, without diagnosis of hypertension: Secondary | ICD-10-CM | POA: Insufficient documentation

## 2022-10-26 DIAGNOSIS — F419 Anxiety disorder, unspecified: Secondary | ICD-10-CM | POA: Insufficient documentation

## 2022-10-26 MED ORDER — ESCITALOPRAM OXALATE 10 MG PO TABS
10.0000 mg | ORAL_TABLET | Freq: Every day | ORAL | 1 refills | Status: DC
Start: 2022-10-26 — End: 2022-12-23

## 2022-10-26 NOTE — Assessment & Plan Note (Signed)
Differential includes anxiety, cardiovascular disease, asthma, GERD.  Does not feel like her asthma and she has no clinical sign of asthma.  I suspect it is anxiety and we will start her on treatment for that.  I have made her follow-up with her PCP.  I have discussed timeline for improvement of anxiety.  Given her risk factors, elevated BMI, smoker, elevated blood pressure today and history of elevated pressures in the past without overt diagnosis of hypertension, I think she should probably be evaluated by cardiology and has sent that referral.

## 2022-10-26 NOTE — Assessment & Plan Note (Signed)
Starting Lexapro today.  Follow-up 3 weeks.

## 2022-10-26 NOTE — Patient Instructions (Signed)
I have sent in a prescription for Lexapro.  Please start taking 1 a day.  Going to have you follow-up with your regular PCP in 3 to 4 weeks for several reasons, #1.  Follow-up on Lexapro and chest pressure #2.  Follow-up on blood pressure #3.  Start discussion about weight management.  I think all of these things are important.  I have also put in a referral for you for cardiology because I believe we need to completely be sure that you are not at risk for heart disease.  You should hear directly from the cardiology office in about 2 to 3 weeks.  If you have not heard from them by the time you come back to see Dr. Jena Gauss, please let her know.  It was great to meet you!

## 2022-10-26 NOTE — Assessment & Plan Note (Signed)
I have set up an appointment for her to discuss options with her PCP.  Ozempic is likely not an option for her but they can discuss other medications such as Topamax, phentermine etc.

## 2022-10-26 NOTE — Progress Notes (Unsigned)
    CHIEF COMPLAINT / HPI: 3 to 4 weeks of chest pressure.  Comes and goes.  She has been waking up with it at night.  Will wake up from a deep sleep and have chest pressure and be very frightened.  She feels like this is most likely anxiety.  She has had some anxiety before, years ago after the birth of her second child but it has never been this bad.  No specific new stressors.  She is also having chest pressure with various activities throughout the day.  She works at a group home.  No specific activity seems to cause it.  She is a smoker. 2.  Concerned about weight gain.  She says she has been steadily gaining weight and she would like to address this.  She is very interested in Ozempic.      PERTINENT  PMH / PSH: I have reviewed the patient's medications, allergies, past medical and surgical history, smoking status and updated in the EMR as appropriate. Previous EKG reviewed from August, 2023: Normal sinus rhythm.  Borderline criteria for LVH.  OBJECTIVE:  BP 125/88   Pulse 97   Ht 5\' 4"  (1.626 m)   Wt 272 lb (123.4 kg)   SpO2 99%   BMI 46.69 kg/m  GENERAL: Well-developed no acute distress.  BMI 46.69. LUNGS: Clear to auscultation bilaterally.  No rales or wheeze.  No increased work of breathing.  Good air sounds in all lung fields. CV: Regular rate and rhythm without murmur  ASSESSMENT / PLAN:   Other chest pain Differential includes anxiety, cardiovascular disease, asthma, GERD.  Does not feel like her asthma and she has no clinical sign of asthma.  I suspect it is anxiety and we will start her on treatment for that.  I have made her follow-up with her PCP.  I have discussed timeline for improvement of anxiety.  Given her risk factors, elevated BMI, smoker, elevated blood pressure today and history of elevated pressures in the past without overt diagnosis of hypertension, I think she should probably be evaluated by cardiology and has sent that referral.   Morbid obesity  (HCC) I have set up an appointment for her to discuss options with her PCP.  Ozempic is likely not an option for her but they can discuss other medications such as Topamax, phentermine etc.  Asthma Lung exam is totally clear today.  Do not think her current issues are result of asthma.  Anxiety Starting Lexapro today.  Follow-up 3 weeks.  Elevated blood pressure reading Follow-up appointment set.  Will need labs as well.   Denny Levy MD

## 2022-10-26 NOTE — Assessment & Plan Note (Signed)
>>  ASSESSMENT AND PLAN FOR MORBID OBESITY (HCC) WRITTEN ON 10/26/2022  4:34 PM BY NEAL, SARA L, MD  I have set up an appointment for her to discuss options with her PCP.  Ozempic is likely not an option for her but they can discuss other medications such as Topamax, phentermine etc.

## 2022-10-26 NOTE — Assessment & Plan Note (Signed)
Lung exam is totally clear today.  Do not think her current issues are result of asthma.

## 2022-10-26 NOTE — Assessment & Plan Note (Signed)
Follow-up appointment set.  Will need labs as well.

## 2022-10-27 ENCOUNTER — Ambulatory Visit: Payer: Medicaid Other | Admitting: Family Medicine

## 2022-11-10 ENCOUNTER — Encounter: Payer: Self-pay | Admitting: Student

## 2022-11-10 ENCOUNTER — Ambulatory Visit (INDEPENDENT_AMBULATORY_CARE_PROVIDER_SITE_OTHER): Payer: Medicaid Other | Admitting: Student

## 2022-11-10 VITALS — BP 152/100 | HR 96 | Ht 64.0 in | Wt 279.8 lb

## 2022-11-10 DIAGNOSIS — F32A Depression, unspecified: Secondary | ICD-10-CM | POA: Diagnosis not present

## 2022-11-10 DIAGNOSIS — Z6841 Body Mass Index (BMI) 40.0 and over, adult: Secondary | ICD-10-CM | POA: Diagnosis not present

## 2022-11-10 DIAGNOSIS — R030 Elevated blood-pressure reading, without diagnosis of hypertension: Secondary | ICD-10-CM

## 2022-11-10 DIAGNOSIS — F17200 Nicotine dependence, unspecified, uncomplicated: Secondary | ICD-10-CM

## 2022-11-10 LAB — POCT GLYCOSYLATED HEMOGLOBIN (HGB A1C): Hemoglobin A1C: 5.3 % (ref 4.0–5.6)

## 2022-11-10 NOTE — Assessment & Plan Note (Signed)
Has yet to start her Lexapro, will start that this week.

## 2022-11-10 NOTE — Assessment & Plan Note (Signed)
Congratulated patient, 3 weeks without tobacco use. Likely contributor to weight gain.

## 2022-11-10 NOTE — Progress Notes (Signed)
  SUBJECTIVE:   CHIEF COMPLAINT / HPI:   Last seen with Dr. Jennette Kettle for chest pressure and weight gain 10/26/22.   Chest Pressure: Sent to cardiology by Dr. Jennette Kettle on last visit. Currently not having chest pain. Has cardiology appt in September.   Weight Gain:  Was seeing a weight loss doctor 3-4 months, received shots of unknown medication Period of greatest weight gain: 279 lb currently  Lowest adult weight: 200 lbs Highest adult weight: 279 lbs   History of Weight Loss Efforts Greatest amount of weight lost: 10 lb over couple of months Amount of time that loss was maintained: 1-2 months Circumstances associated with regain of weight: Stressed in life  Successful weight loss techniques attempted: self-directed dieting and supervised diet program Unsuccessful weight loss techniques attempted: self-directed dieting and supervised diet program  Current Exercise Habits  jogging and walking  Current Eating Habits Number of regular meals per day: 1--was eating once a day but now eating 3 meals a day after she stopped smoking  Number of snacking episodes per day: Will have baked chips every once in a while Will eat a biscuit from [employer], chicken with lunch or dinner. Will eat broccoli and will eat fruit. Has tried eating more tuna and fruits  Does not drink soda and tries to drink no sugary drinks, mostly water Zero gatorade Binge behavior?: yes - current Eating precipitated by stress? yes  Guilt feelings associated with eating? yes   Other Potential Contributing Factors Use of alcohol: average 3 shots -- on a Saturday  Use of medications that may cause weight gain none Psych History: depression   PERTINENT  PMH / PSH:  Asthma Allergies Eczema  Elevated BMI Depression Stopped smoking three weeks ago!!  Patient Care Team: [name], MD as PCP - General (Family Medicine) OBJECTIVE:  BP (!) 152/100   Pulse 96   Ht 5\' 4"  (1.626 m)   Wt 279 lb 12.8 oz (126.9 kg)   LMP  10/31/2022   SpO2 100%   BMI 48.03 kg/m  Physical Exam  General: Alert and oriented in no apparent distress Heart: Regular rate and rhythm with no murmurs appreciated Lungs: Normal WOB Abdomen: no abdominal pain Skin: Warm and dry   ASSESSMENT/PLAN:  BMI 40.0-44.9, adult (HCC) Assessment & Plan: Discussed all medication options, do not feel phentermine or naltrexone/bupropion appropriate given history. Topamax declined by patient as well as healthy weight and wellness referral. Does not meet criteria for coverage of GLP1 although I think it would greatly help her. Bariatric surgery referral sent at request of patient after discussion. TSH as well for weight gain though likely from smoking cessation. Food diary advised.   Orders: -     POCT glycosylated hemoglobin (Hb A1C) -     TSH Rfx on Abnormal to Free T4 -     Amb Referral to Bariatric Surgery  TOBACCO USER Assessment & Plan: Congratulated patient, 3 weeks without tobacco use. Likely contributor to weight gain.    Depression, unspecified depression type Assessment & Plan: Has yet to start her Lexapro, will start that this week.   Elevated blood pressure reading Assessment & Plan: Again elevated, returning in 2 weeks and will recheck. If still elevated, start medication.      Will need pap smear as well.   Return in about 2 weeks (around 11/24/2022). [name], MD 11/11/2022, 9:41 AM PGY-3, [facility]

## 2022-11-10 NOTE — Assessment & Plan Note (Addendum)
Discussed all medication options, do not feel phentermine or naltrexone/bupropion appropriate given history. Topamax declined by patient as well as healthy weight and wellness referral. Does not meet criteria for coverage of GLP1 although I think it would greatly help her. Bariatric surgery referral sent at request of patient after discussion. TSH as well for weight gain though likely from smoking cessation. Food diary advised.

## 2022-11-10 NOTE — Patient Instructions (Addendum)
It was great to see you today! Thank you for choosing [facility] for your primary care. Gail Walker was seen for follow up.  Today we addressed: Follow up with cardiology   [facility] Address: [address], [place], [place] [address] Phone: [phone]  Because of your chest pressure, we are limited on medications to try Would you like to go to healthy weight and wellness, if so, please let me know    If you haven't already, sign up for My Chart to have easy access to your labs results, and communication with your primary care physician.  I recommend that you always bring your medications to each appointment as this makes it easy to ensure you are on the correct medications and helps [place] not miss refills when you need them. Call the clinic at [phone] if your symptoms worsen or you have any concerns.  You should return to our clinic Return in about 2 weeks (around 11/24/2022). Please arrive 15 minutes before your appointment to ensure smooth check in process.  We appreciate your efforts in making this happen.  Thank you for allowing me to participate in your care, Gail Martinez, MD 11/10/2022, 2:58 PM PGY-3, [facility]

## 2022-11-11 NOTE — Assessment & Plan Note (Signed)
Again elevated, returning in 2 weeks and will recheck. If still elevated, start medication.

## 2022-11-14 ENCOUNTER — Ambulatory Visit: Payer: Medicaid Other | Admitting: Student

## 2022-11-14 NOTE — Progress Notes (Deleted)
  SUBJECTIVE:   CHIEF COMPLAINT / HPI:   Elevated BP: Rechecking today Denies swelling in the extremities or headaches   Elevated BMI  Concerned about weight gain Discussed extensively last visit  Instructed to record food intake via diary Sent bariatric referral to discuss surgery as well on patient request after discussion   PERTINENT  PMH / PSH:  Asthma Allergies Eczema  Elevated BMI Depression    Patient Care Team: [name], MD as PCP - General (Family Medicine) OBJECTIVE:  LMP 10/31/2022  Physical Exam   ASSESSMENT/PLAN:  There are no diagnoses linked to this encounter.  HPV? Pap smear   No follow-ups on file. [name], MD 11/14/2022, 12:12 PM PGY-***, [facility] {    This will disappear when note is signed, click to select method of visit    :1}

## 2022-11-19 ENCOUNTER — Encounter (HOSPITAL_COMMUNITY): Payer: Self-pay

## 2022-11-19 ENCOUNTER — Other Ambulatory Visit: Payer: Self-pay

## 2022-11-19 ENCOUNTER — Emergency Department (HOSPITAL_COMMUNITY)
Admission: EM | Admit: 2022-11-19 | Discharge: 2022-11-19 | Disposition: A | Discharge status: Home / Self Care | Payer: Medicaid Other | Attending: Emergency Medicine | Admitting: Emergency Medicine

## 2022-11-19 ENCOUNTER — Emergency Department (HOSPITAL_COMMUNITY): Payer: Medicaid Other

## 2022-11-19 DIAGNOSIS — H53149 Visual discomfort, unspecified: Secondary | ICD-10-CM | POA: Insufficient documentation

## 2022-11-19 DIAGNOSIS — M542 Cervicalgia: Secondary | ICD-10-CM | POA: Diagnosis not present

## 2022-11-19 DIAGNOSIS — R110 Nausea: Secondary | ICD-10-CM | POA: Insufficient documentation

## 2022-11-19 DIAGNOSIS — R519 Headache, unspecified: Secondary | ICD-10-CM | POA: Diagnosis present

## 2022-11-19 LAB — CBC WITH DIFFERENTIAL/PLATELET
Abs Immature Granulocytes: 0.01 10*3/uL (ref 0.00–0.07)
Basophils Absolute: 0 10*3/uL (ref 0.0–0.1)
Basophils Relative: 1 %
Eosinophils Absolute: 0.3 10*3/uL (ref 0.0–0.5)
Eosinophils Relative: 4 %
HCT: 38.3 % (ref 36.0–46.0)
Hemoglobin: 12 g/dL (ref 12.0–15.0)
Immature Granulocytes: 0 %
Lymphocytes Relative: 36 %
Lymphs Abs: 2.2 10*3/uL (ref 0.7–4.0)
MCH: 24.8 pg — ABNORMAL LOW (ref 26.0–34.0)
MCHC: 31.3 g/dL (ref 30.0–36.0)
MCV: 79.3 fL — ABNORMAL LOW (ref 80.0–100.0)
Monocytes Absolute: 0.4 10*3/uL (ref 0.1–1.0)
Monocytes Relative: 6 %
Neutro Abs: 3.2 10*3/uL (ref 1.7–7.7)
Neutrophils Relative %: 53 %
Platelets: 341 10*3/uL (ref 150–400)
RBC: 4.83 MIL/uL (ref 3.87–5.11)
RDW: 16 % — ABNORMAL HIGH (ref 11.5–15.5)
WBC: 6 10*3/uL (ref 4.0–10.5)
nRBC: 0 % (ref 0.0–0.2)

## 2022-11-19 LAB — COMPREHENSIVE METABOLIC PANEL
ALT: 16 U/L (ref 0–44)
AST: 14 U/L — ABNORMAL LOW (ref 15–41)
Albumin: 3.9 g/dL (ref 3.5–5.0)
Alkaline Phosphatase: 50 U/L (ref 38–126)
Anion gap: 8 (ref 5–15)
BUN: 14 mg/dL (ref 6–20)
CO2: 24 mmol/L (ref 22–32)
Calcium: 9 mg/dL (ref 8.9–10.3)
Chloride: 103 mmol/L (ref 98–111)
Creatinine, Ser: 0.88 mg/dL (ref 0.44–1.00)
GFR, Estimated: >60 mL/min (ref >60)
Glucose, Bld: 99 mg/dL (ref 70–99)
Potassium: 3.7 mmol/L (ref 3.5–5.1)
Sodium: 135 mmol/L (ref 135–145)
Total Bilirubin: 0.5 mg/dL (ref 0.3–1.2)
Total Protein: 8.1 g/dL (ref 6.5–8.1)

## 2022-11-19 LAB — HCG, SERUM, QUALITATIVE: Preg, Serum: NEGATIVE

## 2022-11-19 MED ORDER — PROCHLORPERAZINE EDISYLATE 10 MG/2ML IJ SOLN
10.0000 mg | Freq: Once | INTRAMUSCULAR | Status: AC
  Administered 2022-11-19: 10 mg via INTRAVENOUS
  Filled 2022-11-19: qty 2

## 2022-11-19 MED ORDER — SODIUM CHLORIDE 0.9 % IV BOLUS
1000.0000 mL | Freq: Once | INTRAVENOUS | Status: AC
  Administered 2022-11-19: 1000 mL via INTRAVENOUS

## 2022-11-19 MED ORDER — DIPHENHYDRAMINE HCL 50 MG/ML IJ SOLN
50.0000 mg | Freq: Once | INTRAMUSCULAR | Status: AC
  Administered 2022-11-19: 50 mg via INTRAVENOUS
  Filled 2022-11-19: qty 1

## 2022-11-19 MED ORDER — IOHEXOL 350 MG/ML SOLN
100.0000 mL | Freq: Once | INTRAVENOUS | Status: AC | PRN
  Administered 2022-11-19: 100 mL via INTRAVENOUS

## 2022-11-19 NOTE — ED Provider Notes (Signed)
[place] EMERGENCY DEPARTMENT AT [facility] Provider Note   CSN: [id number] Arrival date & time: 11/19/22  1234     History  Chief Complaint  Patient presents with   Headache    Gail Walker is a 32 y.o. female.  The history is provided by the patient and medical records. No language interpreter was used.  Headache Pain location:  R temporal and R parietal Quality:  Stabbing and dull Radiates to:  R neck Severity currently:  10/10 Severity at highest:  10/10 Onset quality:  Sudden Duration:  2 days Timing:  Constant Progression:  Unchanged Chronicity:  New Similar to prior headaches: worse than prior.   Context: bright light and loud noise   Context: not straining   Relieved by:  Nothing Worsened by:  Nothing Ineffective treatments:  None tried Associated symptoms: nausea, neck pain (more chronic per pt) and photophobia   Associated symptoms: no abdominal pain, no back pain, no blurred vision, no congestion, no cough, no diarrhea, no dizziness, no eye pain, no facial pain, no fatigue, no fever, no near-syncope, no neck stiffness, no numbness, no seizures, no sore throat, no syncope, no URI, no visual change, no vomiting and no weakness        Home Medications Prior to Admission medications   Medication Sig Start Date End Date Taking? Authorizing Provider  acetaminophen (TYLENOL) 325 MG tablet Take 650 mg by mouth every 6 (six) hours as needed for mild pain or headache. Patient not taking: Reported on 11/05/2020    [provider]  albuterol (PROVENTIL) (2.5 MG/3ML) 0.083% nebulizer solution Take 6 mLs (5 mg total) by nebulization every 4 (four) hours as needed for wheezing or shortness of breath. 07/16/20   Dollene Cleveland, DO  escitalopram (LEXAPRO) 10 MG tablet Take 1 tablet (10 mg total) by mouth daily. 10/26/22   Nestor Ramp, MD  fluticasone (FLONASE) 50 MCG/ACT nasal spray Place 2 sprays into both nostrils daily. 04/17/22   Renne Crigler, PA-C  loratadine (CLARITIN) 10 MG tablet Take 1 tablet (10 mg total) by mouth daily. 04/17/22   Renne Crigler, PA-C  mometasone-formoterol (DULERA) 100-5 MCG/ACT AERO Inhale 2 puffs into the lungs in the morning and at bedtime. 09/23/22   [name], MD  triamcinolone ointment (KENALOG) 0.1 % APPLY TO AFFECTED AREA TWICE A DAY 06/09/22   Alicia Amel, MD  VENTOLIN HFA 108 (90 Base) MCG/ACT inhaler INHALE 2 PUFFS BY MOUTH EVERY 4 HOURS AS NEEDED FOR WHEEZE OR FOR SHORTNESS OF BREATH 07/18/22   [name], MD  amLODipine (NORVASC) 2.5 MG tablet Take 1 tablet (2.5 mg total) by mouth daily. 11/11/19 04/27/20  Bernerd Limbo, CNM  FLUoxetine (PROZAC) 20 MG capsule Take 1 capsule (20 mg total) by mouth daily. 03/28/20 04/27/20  Dollene Cleveland, DO      Allergies    Patient has no known allergies.    Review of Systems   Review of Systems  Constitutional:  Negative for chills, fatigue and fever.  HENT:  Negative for congestion and sore throat.   Eyes:  Positive for photophobia. Negative for blurred vision, pain and visual disturbance.  Respiratory:  Negative for cough, chest tightness, shortness of breath and wheezing.   Cardiovascular:  Negative for chest pain, palpitations, syncope and near-syncope.  Gastrointestinal:  Positive for nausea. Negative for abdominal pain, constipation, diarrhea and vomiting.  Genitourinary:  Negative for dysuria and flank pain.  Musculoskeletal:  Positive for neck pain (more chronic  per pt). Negative for back pain and neck stiffness.  Skin:  Negative for rash and wound.  Neurological:  Positive for headaches. Negative for dizziness, seizures, syncope, speech difficulty, weakness, light-headedness and numbness.  Psychiatric/Behavioral:  Negative for agitation and confusion.   All other systems reviewed and are negative.   Physical Exam Updated Vital Signs BP (!) 140/116   Pulse (!) 102   Temp 97.7 F (36.5 C) (Oral)   Resp 18   Ht 5\' 4"   (1.626 m)   Wt 122.5 kg   LMP 10/31/2022   SpO2 99%   BMI 46.35 kg/m  Physical Exam Vitals and nursing note reviewed.  Constitutional:      General: She is not in acute distress.    Appearance: She is well-developed. She is not ill-appearing, toxic-appearing or diaphoretic.  HENT:     Head: Normocephalic and atraumatic.     Mouth/Throat:     Mouth: Mucous membranes are moist.  Eyes:     Conjunctiva/sclera: Conjunctivae normal.  Cardiovascular:     Rate and Rhythm: Normal rate and regular rhythm.     Heart sounds: No murmur heard. Pulmonary:     Effort: Pulmonary effort is normal. No respiratory distress.     Breath sounds: Normal breath sounds. No wheezing, rhonchi or rales.  Chest:     Chest wall: No tenderness.  Abdominal:     Palpations: Abdomen is soft.     Tenderness: There is no abdominal tenderness.  Musculoskeletal:        General: No swelling.     Cervical back: Neck supple.  Skin:    General: Skin is warm and dry.     Capillary Refill: Capillary refill takes less than 2 seconds.     Findings: No rash.  Neurological:     Mental Status: She is alert.  Psychiatric:        Mood and Affect: Mood normal.     ED Results / Procedures / Treatments   Labs (all labs ordered are listed, but only abnormal results are displayed) Labs Reviewed  CBC WITH DIFFERENTIAL/PLATELET - Abnormal; Notable for the following components:      Result Value   MCV 79.3 (*)    MCH 24.8 (*)    RDW 16.0 (*)    All other components within normal limits  COMPREHENSIVE METABOLIC PANEL - Abnormal; Notable for the following components:   AST 14 (*)    All other components within normal limits  HCG, SERUM, QUALITATIVE    EKG None   Radiology CT ANGIO HEAD NECK W WO CM  Result Date: 11/19/2022 CLINICAL DATA:  Headache, sudden and severe beginning yesterday. EXAM: CT ANGIOGRAPHY HEAD AND NECK WITH AND WITHOUT CONTRAST TECHNIQUE: Multidetector CT imaging of the head and neck was  performed using the standard protocol during bolus administration of intravenous contrast. Multiplanar CT image reconstructions and MIPs were obtained to evaluate the vascular anatomy. Carotid stenosis measurements (when applicable) are obtained utilizing NASCET criteria, using the distal internal carotid diameter as the denominator. RADIATION DOSE REDUCTION: This exam was performed according to the departmental dose-optimization program which includes automated exposure control, adjustment of the mA and/or kV according to patient size and/or use of iterative reconstruction technique. CONTRAST:  OMNIPAQUE IOHEXOL 350 MG/ML SOLN COMPARISON:  None Available. FINDINGS: CT HEAD FINDINGS The head CT portion of the examination is degraded by some sort of this are artifact. No useful brain detail is available. I do not see evidence of intracranial hemorrhage,  but would suggest a repeat of the examination if concern persists. Looking at the CT angiogram images with a parenchymal window, no hemorrhage is seen. CTA NECK FINDINGS Aortic arch: Normal appearance. Right carotid system: Normal. No atherosclerotic disease. No dissection. Left carotid system: Similarly normal. Vertebral arteries: Normal. Skeleton: Normal Other neck: Soft tissue neck is normal. Upper chest: Lung apices are normal. Review of the MIP images confirms the above findings CTA HEAD FINDINGS Anterior circulation: Both internal carotid arteries are patent through the skull base and siphon regions. No siphon abnormality. The anterior and middle cerebral vessels are normal. No aneurysm or vascular malformation. Posterior circulation: Both vertebral arteries are widely patent to the basilar artery. No basilar stenosis. Posterior circulation branch vessels are normal. Venous sinuses: Patent and normal. Anatomic variants: None significant. Review of the MIP images confirms the above findings IMPRESSION: 1. Normal CT angiography of the head and neck. 2. The  head CT portion of the examination is degraded by some sort of unusual artifact. No useful brain detail is available. I do not see evidence of intracranial hemorrhage, but would suggest a repeat of the examination if concern persists. Looking at the CT angiogram images with a parenchymal window, no hemorrhage or brain parenchymal abnormality is seen. Electronically Signed   By: Paulina Fusi M.D.   On: 11/19/2022 14:54      Procedures Procedures     Medications Ordered in ED Medications  prochlorperazine (COMPAZINE) injection 10 mg (10 mg Intravenous Given 11/19/22 1327)  diphenhydrAMINE (BENADRYL) injection 50 mg (50 mg Intravenous Given 11/19/22 1327)  sodium chloride 0.9 % bolus 1,000 mL (0 mLs Intravenous Stopped 11/19/22 1519)  iohexol (OMNIPAQUE) 350 MG/ML injection 100 mL (100 mLs Intravenous Contrast Given 11/19/22 1415)     ED Course/ Medical Decision Making/ A&P                                 Medical Decision Making Amount and/or Complexity of Data Reviewed Labs: ordered. Radiology: ordered.  Risk Prescription drug management.     Gail Walker is a 32 y.o. female with past medical history significant for asthma, depression, anxiety, and GERD who presents with severe headache.  According to patient, at 29 AM yesterday she had sudden onset of severe headache in the right side of her head.  She reports she chronically has some neck pain and that is hurting as well.  She denies any fevers, chills, or history of meningitis.  She reports no blurry vision but has had some nausea.  No vomiting.  No other neurologic complaints.  She reports the pain is 10 out of 10 in severity and is throbbing sharp and dull.  She reports has never had a headache like this before but has had other headaches more chronically.  Denies any trauma.  Denies any other head injuries.  Denies any fevers, chills, congestion, cough, constipation, diarrhea, or urinary changes.  Denies tick exposures.  On exam,  lungs clear.  Chest nontender.  Abdomen nontender.  Patient moving all extremities.  No focal neurologic deficits initially.  Patient had some light sensitivity and sound sensitivity.  No nuchal rigidity no focal neck tenderness.  No head tenderness.  Pupils are symmetric and reactive with normal extract movements.  Patient clearly uncomfortable and holding the right side of her head.  Clinically I suspect this is an atypical migraine causing her severe headaches however the maximal pain and onset, throbbing  nature, and it feeling somewhat different than previous headache we agreed to get imaging and give her headache cocktail.  We had a discussion and if CT is reassuring, I suspect we will have sure decision-making conversation and agree not to do lumbar puncture at this time.  Anticipate reassessment of the workup.           3:28 PM Patient's workup again returned.  CBC and CMP overall reassuring.  She is not pregnant.  CT head and neck did not show evidence of significant bleed, stroke, aneurysm, or vascular problem.  There was some artifact and if symptoms are improving, radiology recommended repeat imaging however on reassessment she reports headache has complete resolved.  She would like to go home.  Given resolution of symptoms and reassuring workup, we will discharge home.  Patient agrees.  Patient will be discharged for outpatient follow-up.         Final Clinical Impression(s) / ED Diagnoses Final diagnoses:  Bad headache     Clinical Impression: 1. Bad headache     Disposition: Discharge  Condition: Good  I have discussed the results, Dx and Tx plan with the pt(& family if present). He/she/they expressed understanding and agree(s) with the plan. Discharge instructions discussed at great length. Strict return precautions discussed and pt &/or family have verbalized understanding of the instructions. No further questions at time of discharge.    New Prescriptions   No  medications on file    Follow Up: [facility] [address] [place] [address] [place] [address] [phone] Schedule an appointment as soon as possible for a visit    [facility] Emergency Department at [facility] [address] [place] [address] [phone]        , Canary Brim, MD 11/19/22 [phone]

## 2022-11-19 NOTE — Discharge Instructions (Signed)
Your history, exam, and evaluation today led [place] to get labs that were reassuring and the CT head that did not show evidence of bleed aneurysm or other problem.  Given resolution of your headache, we feel you are safe for discharge home.  Please rest and stay hydrated.  If any symptoms change or worsen acutely, please return to the nearest emergency department with your primary doctor.

## 2022-11-19 NOTE — ED Triage Notes (Signed)
Patient has had a headache that causes a stabbing pain in the right upper area of her head. Began at 11am yesterday. No nausea or changes in vision.

## 2022-12-08 ENCOUNTER — Other Ambulatory Visit: Payer: Self-pay

## 2022-12-08 MED ORDER — DULERA 100-5 MCG/ACT IN AERO: 2.0000 | INHALATION_SPRAY | Freq: Two times a day (BID) | RESPIRATORY_TRACT | 1 refills | Status: DC

## 2022-12-13 ENCOUNTER — Emergency Department (HOSPITAL_COMMUNITY)
Admission: EM | Admit: 2022-12-13 | Discharge: 2022-12-14 | Discharge status: Against Medical Advice | Payer: Medicaid Other | Attending: Emergency Medicine | Admitting: Emergency Medicine

## 2022-12-13 ENCOUNTER — Emergency Department (HOSPITAL_COMMUNITY): Payer: Medicaid Other

## 2022-12-13 ENCOUNTER — Ambulatory Visit (INDEPENDENT_AMBULATORY_CARE_PROVIDER_SITE_OTHER): Payer: Medicaid Other | Admitting: Student

## 2022-12-13 ENCOUNTER — Encounter (HOSPITAL_COMMUNITY): Payer: Self-pay

## 2022-12-13 ENCOUNTER — Other Ambulatory Visit: Payer: Self-pay

## 2022-12-13 VITALS — BP 129/93 | HR 91 | Ht 64.0 in | Wt 273.8 lb

## 2022-12-13 DIAGNOSIS — R0602 Shortness of breath: Secondary | ICD-10-CM | POA: Insufficient documentation

## 2022-12-13 DIAGNOSIS — F419 Anxiety disorder, unspecified: Secondary | ICD-10-CM | POA: Insufficient documentation

## 2022-12-13 DIAGNOSIS — F17210 Nicotine dependence, cigarettes, uncomplicated: Secondary | ICD-10-CM | POA: Diagnosis not present

## 2022-12-13 DIAGNOSIS — G478 Other sleep disorders: Secondary | ICD-10-CM | POA: Insufficient documentation

## 2022-12-13 DIAGNOSIS — R0789 Other chest pain: Secondary | ICD-10-CM | POA: Diagnosis present

## 2022-12-13 DIAGNOSIS — Z5321 Procedure and treatment not carried out due to patient leaving prior to being seen by health care provider: Secondary | ICD-10-CM | POA: Diagnosis not present

## 2022-12-13 DIAGNOSIS — G4733 Obstructive sleep apnea (adult) (pediatric): Secondary | ICD-10-CM | POA: Diagnosis present

## 2022-12-13 MED ORDER — HYDROXYZINE HCL 10 MG PO TABS: 10.0000 mg | ORAL_TABLET | Freq: Every evening | ORAL | 1 refills | Status: DC | PRN

## 2022-12-13 NOTE — ED Triage Notes (Signed)
Pt reports central chest pressure for 2 month. Does not radiate.e States Saw pcp because she thought it was anxiety attack and was referred to cardiology sept 16. Does not want to wait. Denies any other symptoms.

## 2022-12-13 NOTE — ED Provider Triage Note (Signed)
Emergency Medicine Provider Triage Evaluation Note  Gail Walker , a 32 y.o. female  was evaluated in triage.  Pt complains of chest tightness x 2 months.  Central chest tightness while laying down and trying to sleep, doesn't happen any other time Just found out she has sleep apnea Feeling more anxious Just stopped smoking cigarettes 2 months ago, when the tightness started it No change prompting ER visit Also has been told she has high BP and wants to make sure everything is okay. Was referred to cardiologist on 9/16 but did not want to wait.   Review of Systems  Positive: Chest tightness, intermittent SOB, lack of sleep, anxiety Negative: Fever, cough  Physical Exam  BP (!) 158/112 (BP Location: Left Arm)   Pulse 87   Temp 98.5 F (36.9 C) (Oral)   Resp 16   LMP 11/21/2022   SpO2 100%  Gen:   Awake, no distress   Resp:  Normal effort  MSK:   Moves extremities without difficulty  Other:    Medical Decision Making  Medically screening exam initiated at 4:23 PM.  Appropriate orders placed.  Noya Griffus was informed that the remainder of the evaluation will be completed by another provider, this initial triage assessment does not replace that evaluation, and the importance of remaining in the ED until their evaluation is complete.  Workup initiated   Alastair Hennes T, PA-C 12/13/22 1623

## 2022-12-13 NOTE — Assessment & Plan Note (Signed)
Patient comes in with concern for obstructive sleep apnea.  Patient notes symptoms have been going on for a year.  Patient notes she snores, wakes up in the middle of her sleep gasping for air, and was told by her boyfriend this happens 5-6 times in an hour.  Patient also wakes up with headaches, and is often tired.  Patient has poor sleep hygiene, as she has anxiety around falling asleep given her choking episodes.  Patient oral exam normal, with Mallampati class IV appearance.  Patient also has BMI of 47.  Given BMI, and history, as well as physical exam, concern for OSA.  Will have patient scheduled for sleep study. - Ambulatory referral to sleep study

## 2022-12-13 NOTE — Patient Instructions (Addendum)
It was great to see you! Thank you for allowing me to participate in your care!  I recommend that you always bring your medications to each appointment as this makes it easy to ensure we are on the correct medications and helps [place] not miss when refills are needed.  Our plans for today:  - Sleep Apnea It sounds like you have sleep apnea, I will place a referral to get you a sleep study, hopefully they can get the study done and the CPAP fitting done in one go. They will review your sleep and determine if you need a CPAP.  - Anxiety / Difficulty falling asleep  Take your lexapro daily, as this takes a few weeks to kick in.  I'm prescribing Atarax 10 mg, to help you fall asleep/with your anxiety. Take before bed and be sure not to drive or do important task while on the medicine, as it makes you sleepy.   Make follow up appointment in 2-4 weeks to check on your anxiety    Take care and seek immediate care sooner if you develop any concerns.   Dr. Bess Kinds, MD [facility] Medicine

## 2022-12-13 NOTE — Progress Notes (Signed)
  SUBJECTIVE:   CHIEF COMPLAINT / HPI:   Concern for sleep apnea Notes that she snores, and has periods when she stops breathing (Boyfriend has told her, and sometimes she wakes up gasping for air), BF reports that this happens 5-6 times in an hour. Always waking up feeling tired and with HA. Goes to sleep around 2-3 and wakes at 7 am, she's afraid to stop breathing in her sleep. Also appreciates anxiety before sleep and difficult time falling asleep. Is taking anxiety medicine, was started on lexapro recently, last night was her 2nd time.   PERTINENT  PMH / PSH:    Patient Care Team: [name], MD as PCP - General (Family Medicine) OBJECTIVE:  BP (!) 129/93   Pulse 91   Ht 5\' 4"  (1.626 m)   Wt 273 lb 12.8 oz (124.2 kg)   LMP 11/21/2022   SpO2 94%   BMI 47.00 kg/m  Physical Exam HENT:     Mouth/Throat:     Lips: Pink.     Mouth: Mucous membranes are moist. No injury or oral lesions.     Tongue: No lesions. Tongue does not deviate from midline.     Palate: No mass and lesions.     Pharynx: Oropharynx is clear. Uvula midline. No pharyngeal swelling, oropharyngeal exudate, posterior oropharyngeal erythema or uvula swelling.     Comments: Mallampati class IV Psychiatric:        Mood and Affect: Mood normal.        Speech: Speech normal.        Behavior: Behavior normal.        Thought Content: Thought content normal. Thought content does not include homicidal or suicidal ideation.      ASSESSMENT/PLAN:  Obstructive sleep apnea syndrome Assessment & Plan: Patient comes in with concern for obstructive sleep apnea.  Patient notes symptoms have been going on for a year.  Patient notes she snores, wakes up in the middle of her sleep gasping for air, and was told by her boyfriend this happens 5-6 times in an hour.  Patient also wakes up with headaches, and is often tired.  Patient has poor sleep hygiene, as she has anxiety around falling asleep given her choking episodes.  Patient  oral exam normal, with Mallampati class IV appearance.  Patient also has BMI of 47.  Given BMI, and history, as well as physical exam, concern for OSA.  Will have patient scheduled for sleep study. - Ambulatory referral to sleep study  Orders: -     Ambulatory referral to Sleep Studies  Anxiety Assessment & Plan: Patient notes she has a history of anxiety, and is currently taking Lexapro.  Patient reports she started Lexapro recently as yesterday was her second day of taking it.  Patient notes anxiety around falling asleep given her episodes of waking choking/gasping for air.  Patient also notes difficult time falling asleep secondary to this anxiety.  Will recommend patient try Atarax to help treat anxiety, and fall asleep.  Will encourage patient to follow-up on anxiety.  Denies any SI/HI. - Continue Lexapro 10 mg - Atarax 10 mg as needed, nightly - Follow-up 2 to 4 weeks   Other orders -     hydrOXYzine HCl; Take 1 tablet (10 mg total) by mouth at bedtime as needed.  Dispense: 30 tablet; Refill: 1   No follow-ups on file. Bess Kinds, MD 12/13/2022, 9:23 AM PGY-3, [facility]

## 2022-12-13 NOTE — Assessment & Plan Note (Addendum)
Patient notes she has a history of anxiety, and is currently taking Lexapro.  Patient reports she started Lexapro recently as yesterday was her second day of taking it.  Patient notes anxiety around falling asleep given her episodes of waking choking/gasping for air.  Patient also notes difficult time falling asleep secondary to this anxiety.  Will recommend patient try Atarax to help treat anxiety, and fall asleep.  Will encourage patient to follow-up on anxiety.  Denies any SI/HI. - Continue Lexapro 10 mg - Atarax 10 mg as needed, nightly - Follow-up 2 to 4 weeks

## 2022-12-23 ENCOUNTER — Telehealth: Payer: Self-pay

## 2022-12-23 ENCOUNTER — Ambulatory Visit (INDEPENDENT_AMBULATORY_CARE_PROVIDER_SITE_OTHER): Payer: Medicaid Other | Admitting: Student

## 2022-12-23 VITALS — BP 128/88 | HR 86 | Ht 64.0 in | Wt 274.0 lb

## 2022-12-23 DIAGNOSIS — F419 Anxiety disorder, unspecified: Secondary | ICD-10-CM | POA: Diagnosis present

## 2022-12-23 MED ORDER — BUSPIRONE HCL 7.5 MG PO TABS
7.5000 mg | ORAL_TABLET | Freq: Two times a day (BID) | ORAL | 1 refills | Status: DC
Start: 2022-12-23 — End: 2023-11-09

## 2022-12-23 MED ORDER — ESCITALOPRAM OXALATE 10 MG PO TABS
10.0000 mg | ORAL_TABLET | Freq: Every day | ORAL | 1 refills | Status: DC
Start: 2022-12-23 — End: 2023-11-09

## 2022-12-23 NOTE — Progress Notes (Signed)
  SUBJECTIVE:   CHIEF COMPLAINT / HPI:   Presents today for anxiety.  Per telephone encounter, she has been in and out of the ED feel like her heart is beating out of her chest.  She never tried Hydroxyzine she feels like her anxiety is interfering with her employment and family life. 2 weeks ago, her boyfriend's mother passed away, patient was close with her. She has not been taking the lexapro every day.   She does have panic attacks sometimes at work. Has about 5 panic attacks a day. She feels like she can't breathe during them but can have conversation.   She declined PHQ-9.  Denies SI/HI.  GAD-7 score of 21.  PERTINENT  PMH / PSH: Anxiety/depression, tobacco use, obesity, OSA  OBJECTIVE:  BP 128/88   Pulse 86   Ht 5\' 4"  (1.626 m)   Wt 274 lb (124.3 kg)   LMP 12/22/2022   SpO2 98%   BMI 47.03 kg/m  Gen: well-appearing, NAD CV: RRR, no murmurs auscultated Pulm: CTAB, normal WOB Psych: normal mood and affect  ASSESSMENT/PLAN:  Anxiety Assessment & Plan: GAD score of 27. Declined PHQ-9. I suspected recent death of partner's family member is playing a role. Would benefit from multifactorial approach. She is amenable to starting counseling, provided therapy resources. Advised consistent use of Lexapro, added Buspar 7.5mg  BID. Discontinued hydroxyzine given she has somnolence during daytime at work. She has sleep medicine referral already for OSA. Follow up in 1 month to measure response of intervention. Consider addressing tobacco use as well.  Orders: -     Escitalopram Oxalate; Take 1 tablet (10 mg total) by mouth daily.  Dispense: 30 tablet; Refill: 1 -     busPIRone HCl; Take 1 tablet (7.5 mg total) by mouth 2 (two) times daily.  Dispense: 60 tablet; Refill: 1  Return in about 1 month (around 01/22/2023) for Anxiety follow-up. Shelby Mattocks, DO 12/23/2022, 1:30 PM PGY-3, [facility]

## 2022-12-23 NOTE — Assessment & Plan Note (Addendum)
GAD score of 27. Declined PHQ-9. I suspected recent death of partner's family member is playing a role. Would benefit from multifactorial approach. She is amenable to starting counseling, provided therapy resources. Advised consistent use of Lexapro, added Buspar 7.5mg  BID. Discontinued hydroxyzine given she has somnolence during daytime at work. She has sleep medicine referral already for OSA. Follow up in 1 month to measure response of intervention. Consider addressing tobacco use as well.

## 2022-12-23 NOTE — Patient Instructions (Addendum)
It was great to see you today! Thank you for choosing [facility] for your primary care.  Today we addressed: Please take your Lexapro daily.  I am adding BuSpar 7.5 mg twice daily.  As we discussed, lets set this from several different 1 views.  You already have a referral to sleep specialty.  I am providing you resources for therapy below.  If you haven't already, sign up for My Chart to have easy access to your labs results, and communication with your primary care physician.  Return in about 1 month (around 01/22/2023) for Anxiety follow-up. Please arrive 15 minutes before your appointment to ensure smooth check in process.  We appreciate your efforts in making this happen.  Thank you for allowing me to participate in your care, Gail Mattocks, DO 12/23/2022, 12:16 PM PGY-3, [facility]    Therapy and Counseling Resources Most providers on this list will take Medicaid. Patients with commercial insurance or Medicare should contact their insurance company to get a list of in network providers.  [facility] (takes children) Location 1: [address] [place], [place] [address] Location 2: [address] [place], [place] [address] [phone]   [facility] (spanish speaking therapist available)(habla espanol)(take medicare and medicaid)  [address], [place], [place] [address], [place] [email or web address] [phone]  [facility] [address] [place], [place] [address] [phone]  [facility]   [address] Suite [place], [place] [address]      [phone]  [facility] (spanish available) [address]  [place], [place] [address] [phone]  [facility] (take [facility] and medicare) [address], [address]  [place], [place] [address]       [phone]     [facility] (virtual only) [phone]  Jovita Kussmaul [facility] [address] [place], [place] [phone]  [facility]:  [address] [place] [phone]  [facility]:  [address] [place] [phone]  [facility] (under & uninsured) [address]   [place] [phone]    [email or web address]    [facility] [phone] [address]  [place]    [phone]  [facility] [place] -[address]     Phone:  [phone]     [facility]-  [address]  [phone]   [facility] [address]      [place], [place] [phone]  [facility]: [address] [place], [place], [place]  [phone]   [facility] (Spanish therapist) [email or web address]  [address] [place]  [address]   [place] [address]    [phone]    [facility]   [address],  [place], [place]  [phone]   [facility]  [address] [place]  [phone]  [facility]  [address] [place], [place]        [phone]  Open Access/Walk In Clinic under & uninsured  [facility]  [address] [place], [place] [phone] Crisis [phone]  [facility] [address],  (Spanish)   [address], [place]: [phone]) 8:30 - 12; 1 - 2:30  [facility] [employer],  [address], [place]    [phone]):8:30 - 12; 2 - 3PM  [facility]  [address],  [address],  [place]; [phone]):   Mon - Fri 8 AM - 5 PM  [facility] [address] [place]  MWF 12:30 to 3:00 or call to schedule an appointment  [phone]  Specific Provider options Psychology Today  [email or web address]/us click on find a therapist  enter your zip code left side and select or tailor a therapist for your specific  need.   [facility] Provider Directory [email or web address]  (Medicaid)   Follow all drop down to find a provider  Social Support program [facility] [place] [phone] or [employer] [address], [place], [place] Recovery support and educational   24- Hour Availability:   [facility]  [address] [place], [place] [phone] Crisis [phone]  [facility] [phone]  [place] [facility]  [phone]   [facility]  [phone] (after hours)  [facility]/Mobile Crisis   [phone]  [place] National Suicide Hotline  [phone] Len Childs)  Call 911 or go to emergency room  [facility]  [phone]);  [place] and [employer]  [phone]); [place], [place], [place], [place], [place], [place], [place]

## 2022-12-23 NOTE — Telephone Encounter (Signed)
Patient calls nurse line requesting an apt to discuss anxiety medication.   She reports she has been "in and out" of the ED for "feeling like my heart is beating out of my chest." She reports similar symptoms this morning.   She reports she was prescribed hydroxyzine and reports she has been taking as prescribed, however feels no relief.   She reports her anxiety is interfering with her employment and family life.   Patient scheduled for this morning for evaluation.

## 2023-01-01 NOTE — Progress Notes (Deleted)
Cardiology Office Note:    Date:  01/01/2023   ID:  Gail Walker, DOB [birth year], MRN [id number]  PCP:  [name], MD  Cardiologist:  None  Electrophysiologist:  None   Referring MD: Nestor Ramp, MD   No chief complaint on file. ***  History of Present Illness:    Gail Walker is a 32 y.o. female with a hx of asthma, GERD, gestational diabetes, morbid obesity who is referred by Dr. Jennette Kettle for evaluation of chest pain.  Past Medical History:  Diagnosis Date   Acne    Asthma    uses albuterol inhaler daily   BV (bacterial vaginosis) 03/27/2020   CAP (community acquired pneumonia) 11/14/2015   Chlamydia 2012   GERD (gastroesophageal reflux disease)    during pregnancy - takes zantac   Gestational hypertension 11/05/2019   Indication for care in labor and delivery, antepartum 11/04/2019   Morning sickness 03/12/2018   Obesity affecting pregnancy 09/05/2019   Post-dates pregnancy 11/04/2019   Postpartum depression 03/27/2020   Rh negative state in antepartum period 02/23/2011   Supervision of high risk pregnancy, antepartum 01/26/2011    Nursing Staff Provider  Office Location [facility] Dating  Early [place]  Language  English Anatomy US   WNL  Flu Vaccine  Declined Genetic Screen  NIPS:   AFP:   First Screen:  Quad:    TDaP vaccine  08/16/19 Hgb A1C or  GTT Third trimester 105- WNL  Rhogam  09/05/19   LAB RESULTS   Feeding Plan bottle Blood Type A/Negative/-- (11/18 1656)   Contraception Postplacental Antibody Negative (11/18 1656)  Circ    Past Surgical History:  Procedure Laterality Date   DILATION AND EVACUATION N/A 05/16/2018   Procedure: DILATATION AND EVACUATION;  Surgeon: Lykens Bing, MD;  Location: [facility];  Service: Gynecology;  Laterality: N/A;   OPERATIVE ULTRASOUND N/A 05/16/2018   Procedure: OPERATIVE ULTRASOUND;  Surgeon: Austin Bing, MD;  Location: [facility];  Service: Gynecology;  Laterality: N/A;   pregnancy termination       Current Medications: No outpatient medications have been marked as taking for the 01/02/23 encounter (Appointment) with Little Ishikawa, MD.     Allergies:   Patient has no known allergies.   Social History   Socioeconomic History   Marital status: Single    Spouse name: Not on file   Number of children: Not on file   Years of education: Not on file   Highest education level: Not on file  Occupational History   Occupation: warehouse work in a freezer  Tobacco Use   Smoking status: Former    Current packs/day: 0.00    Average packs/day: 0.3 packs/day for 3.0 years (0.8 ttl pk-yrs)    Types: Cigarettes    Start date: 03/05/2016    Quit date: 03/06/2019    Years since quitting: 3.8   Smokeless tobacco: Never  Vaping Use   Vaping status: Never Used  Substance and Sexual Activity   Alcohol use: No   Drug use: No   Sexual activity: Yes    Birth control/protection: None  Other Topics Concern   Not on file  Social History Narrative   Not on file   Social Determinants of Health   Financial Resource Strain: Not on file  Food Insecurity: No Food Insecurity (10/29/2019)   Hunger Vital Sign    Worried About Running Out of Food in the Last Year: Never true    Ran Out of Food  in the Last Year: Never true  Transportation Needs: No Transportation Needs (10/29/2019)   PRAPARE - Administrator, Civil Service (Medical): No    Lack of Transportation (Non-Medical): No  Physical Activity: Not on file  Stress: Not on file  Social Connections: Not on file     Family History: The patient's ***family history includes Cancer in her maternal aunt; Diabetes in her father. There is no history of Anesthesia problems, Hearing loss, Stroke, or CAD.  ROS:   Please see the history of present illness.    *** All other systems reviewed and are negative.  EKGs/Labs/Other Studies Reviewed:    The following studies were reviewed today: ***  EKG:  EKG is *** ordered  today.  The ekg ordered today demonstrates ***  Recent Labs: 11/10/2022: TSH 0.898 11/19/2022: ALT 16; BUN 14; Creatinine, Ser 0.88; Hemoglobin 12.0; Platelets 341; Potassium 3.7; Sodium 135  Recent Lipid Panel No results found for: "CHOL", "TRIG", "HDL", "CHOLHDL", "VLDL", "LDLCALC", "LDLDIRECT"  Physical Exam:    VS:  LMP 12/22/2022     Wt Readings from Last 3 Encounters:  12/23/22 274 lb (124.3 kg)  12/13/22 273 lb 12.8 oz (124.2 kg)  11/19/22 270 lb (122.5 kg)     GEN: *** Well nourished, well developed in no acute distress HEENT: Normal NECK: No JVD; No carotid bruits LYMPHATICS: No lymphadenopathy CARDIAC: ***RRR, no murmurs, rubs, gallops RESPIRATORY:  Clear to auscultation without rales, wheezing or rhonchi  ABDOMEN: Soft, non-tender, non-distended MUSCULOSKELETAL:  No edema; No deformity  SKIN: Warm and dry NEUROLOGIC:  Alert and oriented x 3 PSYCHIATRIC:  Normal affect   ASSESSMENT:    No diagnosis found. PLAN:    Chest pain:  RTC in***  Medication Adjustments/Labs and Tests Ordered: Current medicines are reviewed at length with the patient today.  Concerns regarding medicines are outlined above.  No orders of the defined types were placed in this encounter.  No orders of the defined types were placed in this encounter.   There are no Patient Instructions on file for this visit.   Signed, Little Ishikawa, MD  01/01/2023 4:02 PM    [facility]

## 2023-01-02 ENCOUNTER — Ambulatory Visit: Payer: Medicaid Other | Attending: Cardiology | Admitting: Cardiology

## 2023-01-03 ENCOUNTER — Encounter: Payer: Self-pay | Admitting: Cardiology

## 2023-02-01 ENCOUNTER — Other Ambulatory Visit: Payer: Self-pay | Admitting: Student

## 2023-02-02 MED ORDER — ALBUTEROL SULFATE (2.5 MG/3ML) 0.083% IN NEBU: 5.0000 mg | INHALATION_SOLUTION | RESPIRATORY_TRACT | 1 refills | Status: DC | PRN

## 2023-02-02 MED ORDER — TRIAMCINOLONE ACETONIDE 0.5 % EX OINT: 1.0000 | TOPICAL_OINTMENT | Freq: Two times a day (BID) | CUTANEOUS | 0 refills | Status: DC

## 2023-02-06 ENCOUNTER — Ambulatory Visit: Payer: Medicaid Other | Admitting: Student

## 2023-02-06 VITALS — BP 120/80 | HR 103 | Wt 277.6 lb

## 2023-02-06 DIAGNOSIS — L659 Nonscarring hair loss, unspecified: Secondary | ICD-10-CM

## 2023-02-06 LAB — POCT SKIN KOH: Skin KOH, POC: NEGATIVE

## 2023-02-06 NOTE — Progress Notes (Signed)
    SUBJECTIVE:   CHIEF COMPLAINT / HPI:   Gail Walker is a 32 y.o. female who presents today for hair loss.  She reports hair loss at the crown of her head for the past 4 years. She thinks this might be getting worse. She reports her hair will grow a little but will then fall out. She reports occasional itchy, raw spots on her hair. She rarely gets braids. She washes her hair every other day with Tresemme shampoo.  No chance of pregnancy currently per patient. TSH normal on 11/10/2022 at 0.898. Hgb was normal at 12 on 11/19/2022.  PERTINENT  PMH / PSH: Anxiety/depression, tobacco use, OSA  OBJECTIVE:   BP 120/80   Pulse (!) 103   Wt 277 lb 9.6 oz (125.9 kg)   SpO2 98%   BMI 47.65 kg/m   General: Pt is sitting in a chair, no acute distress. Cardiovascular: RRR, no murmurs, rubs, gallops. Pulmonary: Normal work of breathing. Lungs clear to auscultation bilaterally. Scalp: No scaling, erythema. No broken hair follicles. Negative hair pull test. Negative woods lamp exam.  ASSESSMENT/PLAN:   Hair loss Pt with negative workup including hair pull test, Wood's lamp exam, KOH prep. Low concern for fungal infection at this time. No concern for alopecia given presentation. Pt is washing hair frequently; some concern for excessive scalp dryness. - Recommend decreasing hair washes to twice a week - Try using Selsun blue shampoo - Will assess ferritin level  Governor Rooks, Medical Student [facility]   I have evaluated this patient along with medical student Governor Rooks and reviewed the above note, making necessary revisions.  Dorothyann Gibbs, MD 02/06/2023, 2:25 PM PGY-3, [facility]

## 2023-02-06 NOTE — Patient Instructions (Addendum)
Gail Walker,  It was lovely seeing you in clinic today! You came in with a concern of hair loss. We ruled out a fungal cause, and this is not an alopecia picture. Your hair loss might be related to an overly-dry scalp.  There are a few things for you to do after today's visit: Reduce washing your hair to twice a week Try using Selsun Blue medicated shampoo when you wash your hair; you can get this at any pharmacy We will get a ferritin level to check your iron level. This can be done across the street at [facility]. They are open until 5pm.   You can also call the [facility] at [phone]; you do not need an appointment.  Thank you for allowing [place] to be a part of your care team! Governor Rooks, medical student Dr. Dorothyann Gibbs, PGY3

## 2023-02-06 NOTE — Assessment & Plan Note (Signed)
Pt with negative workup including hair pull test, Wood's lamp exam, KOH prep. Low concern for fungal infection at this time. No concern for alopecia given presentation. Pt is washing hair frequently; some concern for excessive scalp dryness. - Recommend decreasing hair washes to twice a week - Try using Selsun blue shampoo - Will assess ferritin level

## 2023-02-07 ENCOUNTER — Emergency Department (HOSPITAL_BASED_OUTPATIENT_CLINIC_OR_DEPARTMENT_OTHER)
Admission: EM | Admit: 2023-02-07 | Discharge: 2023-02-07 | Disposition: A | Discharge status: Home / Self Care | Payer: Medicaid Other | Attending: Emergency Medicine | Admitting: Emergency Medicine

## 2023-02-07 ENCOUNTER — Other Ambulatory Visit: Payer: Self-pay

## 2023-02-07 ENCOUNTER — Emergency Department (HOSPITAL_BASED_OUTPATIENT_CLINIC_OR_DEPARTMENT_OTHER): Payer: Medicaid Other

## 2023-02-07 ENCOUNTER — Encounter (HOSPITAL_BASED_OUTPATIENT_CLINIC_OR_DEPARTMENT_OTHER): Payer: Self-pay

## 2023-02-07 DIAGNOSIS — R0789 Other chest pain: Secondary | ICD-10-CM | POA: Insufficient documentation

## 2023-02-07 DIAGNOSIS — J45909 Unspecified asthma, uncomplicated: Secondary | ICD-10-CM | POA: Diagnosis not present

## 2023-02-07 DIAGNOSIS — R072 Precordial pain: Secondary | ICD-10-CM | POA: Diagnosis present

## 2023-02-07 LAB — CBC
HCT: 33.6 % — ABNORMAL LOW (ref 36.0–46.0)
Hemoglobin: 10.7 g/dL — ABNORMAL LOW (ref 12.0–15.0)
MCH: 24.8 pg — ABNORMAL LOW (ref 26.0–34.0)
MCHC: 31.8 g/dL (ref 30.0–36.0)
MCV: 77.8 fL — ABNORMAL LOW (ref 80.0–100.0)
Platelets: 311 10*3/uL (ref 150–400)
RBC: 4.32 MIL/uL (ref 3.87–5.11)
RDW: 15.3 % (ref 11.5–15.5)
WBC: 7 10*3/uL (ref 4.0–10.5)
nRBC: 0 % (ref 0.0–0.2)

## 2023-02-07 LAB — BASIC METABOLIC PANEL
Anion gap: 8 (ref 5–15)
BUN: 17 mg/dL (ref 6–20)
CO2: 23 mmol/L (ref 22–32)
Calcium: 8.6 mg/dL — ABNORMAL LOW (ref 8.9–10.3)
Chloride: 102 mmol/L (ref 98–111)
Creatinine, Ser: 0.99 mg/dL (ref 0.44–1.00)
GFR, Estimated: >60 mL/min (ref >60)
Glucose, Bld: 94 mg/dL (ref 70–99)
Potassium: 3.5 mmol/L (ref 3.5–5.1)
Sodium: 133 mmol/L — ABNORMAL LOW (ref 135–145)

## 2023-02-07 LAB — TROPONIN I (HIGH SENSITIVITY): Troponin I (High Sensitivity): 2 ng/L (ref <18)

## 2023-02-07 LAB — PREGNANCY, URINE: Preg Test, Ur: NEGATIVE

## 2023-02-07 MED ORDER — NAPROXEN 250 MG PO TABS
500.0000 mg | ORAL_TABLET | Freq: Once | ORAL | Status: AC
  Administered 2023-02-07: 500 mg via ORAL
  Filled 2023-02-07: qty 2

## 2023-02-07 MED ORDER — ACETAMINOPHEN 500 MG PO TABS
1000.0000 mg | ORAL_TABLET | Freq: Once | ORAL | Status: AC
Start: 2023-02-07 — End: 2023-02-07
  Administered 2023-02-07: 1000 mg via ORAL
  Filled 2023-02-07: qty 2

## 2023-02-07 MED ORDER — LIDOCAINE 5 % EX PTCH
1.0000 | MEDICATED_PATCH | CUTANEOUS | Status: DC
  Administered 2023-02-07: 1 via TRANSDERMAL
  Filled 2023-02-07: qty 1

## 2023-02-07 MED ORDER — NAPROXEN 500 MG PO TABS: 500.0000 mg | ORAL_TABLET | Freq: Two times a day (BID) | ORAL | 0 refills | Status: DC

## 2023-02-07 NOTE — ED Provider Notes (Signed)
[facility] Provider Note   CSN: [id number] Arrival date & time: 02/07/23  0126     History  Chief Complaint  Patient presents with   Chest Pain    Gail Walker is a 32 y.o. female.  The history is provided by the patient and the spouse.  Chest Pain Pain location:  Substernal area Pain quality: dull and tightness   Pain radiates to:  Does not radiate Onset quality:  Gradual Duration:  2 days Timing:  Constant Progression:  Unchanged Chronicity:  New Context: at rest   Context: not breathing, not eating, not lifting, not stress and not trauma   Context comment:  Worst upon awakening Relieved by:  Nothing Worsened by:  Nothing Ineffective treatments:  None tried Associated symptoms: no back pain, no cough, no diaphoresis, no fever, no heartburn, no lower extremity edema, no orthopnea, no palpitations, no shortness of breath, no syncope, no vomiting and no weakness   Associated symptoms comment:  No travel, no leg pain no OCPs Risk factors: no hypertension   Patient with GERD who has tight SSCP of 2 days duration.  Worst in the am.  Not exertional no associated symptoms.  No travel.  No leg pain no OCP.  No DOE, no SOB.  No n/v/d. Marland Kitchen     Past Medical History:  Diagnosis Date   Acne    Asthma    uses albuterol inhaler daily   BV (bacterial vaginosis) 03/27/2020   CAP (community acquired pneumonia) 11/14/2015   Chlamydia 2012   GERD (gastroesophageal reflux disease)    during pregnancy - takes zantac   Gestational hypertension 11/05/2019   Indication for care in labor and delivery, antepartum 11/04/2019   Morning sickness 03/12/2018   Obesity affecting pregnancy 09/05/2019   Post-dates pregnancy 11/04/2019   Postpartum depression 03/27/2020   Rh negative state in antepartum period 02/23/2011   Supervision of high risk pregnancy, antepartum 01/26/2011    Nursing Staff Provider  Office Location [facility] Dating  Early [place]   Language  English Anatomy US   WNL  Flu Vaccine  Declined Genetic Screen  NIPS:   AFP:   First Screen:  Quad:    TDaP vaccine  08/16/19 Hgb A1C or  GTT Third trimester 105- WNL  Rhogam  09/05/19   LAB RESULTS   Feeding Plan bottle Blood Type A/Negative/-- (11/18 1656)   Contraception Postplacental Antibody Negative (11/18 1656)  Circ       Home Medications Prior to Admission medications   Medication Sig Start Date End Date Taking? Authorizing Provider  naproxen (NAPROSYN) 500 MG tablet Take 1 tablet (500 mg total) by mouth 2 (two) times daily with a meal. 02/07/23  Yes Nawaal Alling, MD  albuterol (PROVENTIL) (2.5 MG/3ML) 0.083% nebulizer solution Take 6 mLs (5 mg total) by nebulization every 4 (four) hours as needed for wheezing or shortness of breath. 02/02/23   [name], MD  busPIRone (BUSPAR) 7.5 MG tablet Take 1 tablet (7.5 mg total) by mouth 2 (two) times daily. 12/23/22   Shelby Mattocks, DO  DULERA 100-5 MCG/ACT AERO INHALE 2 PUFFS INTO THE LUNGS IN THE MORNING AND AT BEDTIME. 02/02/23   [name], MD  escitalopram (LEXAPRO) 10 MG tablet Take 1 tablet (10 mg total) by mouth daily. 12/23/22   Shelby Mattocks, DO  triamcinolone ointment (KENALOG) 0.5 % Apply 1 Application topically 2 (two) times daily. 02/02/23   [name], MD  VENTOLIN HFA 108 [phone] Base)  MCG/ACT inhaler INHALE 2 PUFFS BY MOUTH EVERY 4 HOURS AS NEEDED FOR WHEEZE OR FOR SHORTNESS OF BREATH 02/02/23   [name], MD  amLODipine (NORVASC) 2.5 MG tablet Take 1 tablet (2.5 mg total) by mouth daily. 11/11/19 04/27/20  Bernerd Limbo, CNM  FLUoxetine (PROZAC) 20 MG capsule Take 1 capsule (20 mg total) by mouth daily. 03/28/20 04/27/20  Dollene Cleveland, DO      Allergies    Patient has no known allergies.    Review of Systems   Review of Systems  Constitutional:  Negative for diaphoresis and fever.  HENT:  Negative for facial swelling.   Respiratory:  Negative for cough and shortness of breath.    Cardiovascular:  Positive for chest pain. Negative for palpitations, orthopnea and syncope.  Gastrointestinal:  Negative for heartburn and vomiting.  Musculoskeletal:  Negative for back pain.  Neurological:  Negative for weakness.  All other systems reviewed and are negative.   Physical Exam Updated Vital Signs BP (!) 156/98 (BP Location: Right Arm)   Pulse 92   Temp 97.8 F (36.6 C) (Oral)   Resp 18   LMP 01/12/2023   SpO2 99%  Physical Exam Vitals and nursing note reviewed.  Constitutional:      General: She is not in acute distress.    Appearance: Normal appearance. She is well-developed.  HENT:     Head: Normocephalic and atraumatic.     Nose: Nose normal.  Eyes:     Pupils: Pupils are equal, round, and reactive to light.  Cardiovascular:     Rate and Rhythm: Normal rate and regular rhythm.     Pulses: Normal pulses.     Heart sounds: Normal heart sounds.  Pulmonary:     Effort: Pulmonary effort is normal. No respiratory distress.     Breath sounds: Normal breath sounds.  Chest:     Chest wall: Tenderness present.  Abdominal:     General: Bowel sounds are normal. There is no distension.     Palpations: Abdomen is soft.     Tenderness: There is no abdominal tenderness. There is no guarding or rebound.  Genitourinary:    Vagina: No vaginal discharge.  Musculoskeletal:        General: Normal range of motion.     Cervical back: Normal range of motion and neck supple.  Skin:    General: Skin is dry.     Capillary Refill: Capillary refill takes less than 2 seconds.     Findings: No erythema or rash.  Neurological:     General: No focal deficit present.     Mental Status: She is alert.     Deep Tendon Reflexes: Reflexes normal.  Psychiatric:        Mood and Affect: Mood normal.     ED Results / Procedures / Treatments   Labs (all labs ordered are listed, but only abnormal results are displayed) Results for orders placed or performed during the hospital  encounter of 02/07/23  Basic metabolic panel  Result Value Ref Range   Sodium 133 (L) 135 - 145 mmol/L   Potassium 3.5 3.5 - 5.1 mmol/L   Chloride 102 98 - 111 mmol/L   CO2 23 22 - 32 mmol/L   Glucose, Bld 94 70 - 99 mg/dL   BUN 17 6 - 20 mg/dL   Creatinine, Ser 2.84 0.44 - 1.00 mg/dL   Calcium 8.6 (L) 8.9 - 10.3 mg/dL   GFR, Estimated >13 >24 mL/min  Anion gap 8 5 - 15  CBC  Result Value Ref Range   WBC 7.0 4.0 - 10.5 K/uL   RBC 4.32 3.87 - 5.11 MIL/uL   Hemoglobin 10.7 (L) 12.0 - 15.0 g/dL   HCT 40.9 (L) 81.1 - 91.4 %   MCV 77.8 (L) 80.0 - 100.0 fL   MCH 24.8 (L) 26.0 - 34.0 pg   MCHC 31.8 30.0 - 36.0 g/dL   RDW 78.2 95.6 - 21.3 %   Platelets 311 150 - 400 K/uL   nRBC 0.0 0.0 - 0.2 %  Pregnancy, urine  Result Value Ref Range   Preg Test, Ur NEGATIVE NEGATIVE  Troponin I (High Sensitivity)  Result Value Ref Range   Troponin I (High Sensitivity) 2 <18 ng/L   DG Chest 2 View  Result Date: 02/07/2023 CLINICAL DATA:  Chest pain and pressure. EXAM: CHEST - 2 VIEW COMPARISON:  12/03/2021. FINDINGS: The heart size and mediastinal contours are within normal limits. No consolidation, effusion, or pneumothorax. No acute osseous abnormality. IMPRESSION: No active cardiopulmonary disease. Electronically Signed   By: Thornell Sartorius M.D.   On: 02/07/2023 03:18     EKG EKG Interpretation Date/Time:  Tuesday February 07 2023 01:35:36 EDT Ventricular Rate:  90 PR Interval:  126 QRS Duration:  87 QT Interval:  357 QTC Calculation: 437 R Axis:   62  Text Interpretation: Sinus rhythm Confirmed by Christabell Loseke ([id number]) on 02/07/2023 3:47:11 AM  Radiology DG Chest 2 View  Result Date: 02/07/2023 CLINICAL DATA:  Chest pain and pressure. EXAM: CHEST - 2 VIEW COMPARISON:  12/03/2021. FINDINGS: The heart size and mediastinal contours are within normal limits. No consolidation, effusion, or pneumothorax. No acute osseous abnormality. IMPRESSION: No active cardiopulmonary disease.  Electronically Signed   By: Thornell Sartorius M.D.   On: 02/07/2023 03:18    Procedures Procedures    Medications Ordered in ED Medications  lidocaine (LIDODERM) 5 % 1 patch (1 patch Transdermal Patch Applied 02/07/23 0422)  acetaminophen (TYLENOL) tablet 1,000 mg (1,000 mg Oral Given 02/07/23 0422)  naproxen (NAPROSYN) tablet 500 mg (500 mg Oral Given 02/07/23 0422)    ED Course/ Medical Decision Making/ A&P                                 Medical Decision Making Patient with chest pain, may have slept funny on her chest. Pain worst in am   Amount and/or Complexity of Data Reviewed Independent Historian: spouse    Details: See above  External Data Reviewed: notes.    Details: Previous notes  Labs: ordered.    Details: Pregnancy is negative. Normal white count 7, hemoglobin slightly low 10.7, normal platelet count.  Sodium 133, normal potassium 3.5, normal creatinine 0.99, troponin 2  Radiology: ordered and independent interpretation performed.    Details: Normal CXR by me  ECG/medicine tests: ordered and independent interpretation performed. Decision-making details documented in ED Course.  Risk OTC drugs. Prescription drug management. Risk Details: PERC negative wells 0 highly doubt PE in this low risk patient.  Based on time course one negative troponin and EKG rules out ACS.  Heart socre is 0, very low risk for mace.  Symptoms are reproducible and consistent with MSK pain.  Naproxen ordered.      Final Clinical Impression(s) / ED Diagnoses Final diagnoses:  Chest wall pain   Return for intractable cough, coughing up blood, fevers > 100.4 unrelieved by medication,  shortness of breath, intractable vomiting, chest pain, shortness of breath, weakness, numbness, changes in speech, facial asymmetry, abdominal pain, passing out, Inability to tolerate liquids or food, cough, altered mental status or any concerns. No signs of systemic illness or infection. The patient is  nontoxic-appearing on exam and vital signs are within normal limits.  I have reviewed the triage vital signs and the nursing notes. Pertinent labs & imaging results that were available during my care of the patient were reviewed by me and considered in my medical decision making (see chart for details). After history, exam, and medical workup I feel the patient has been appropriately medically screened and is safe for discharge home. Pertinent diagnoses were discussed with the patient. Patient was given return precautions Rx / DC Orders ED Discharge Orders          Ordered    naproxen (NAPROSYN) 500 MG tablet  2 times daily with meals        02/07/23 0353              Dee Maday, MD 02/07/23 8938

## 2023-02-07 NOTE — ED Triage Notes (Signed)
Pt complaining of generalized chest pain that started 3 days ago and is intermittent.  Pain is soreness and tightness in nature.

## 2023-02-28 NOTE — Progress Notes (Deleted)
Cardiology Office Note:    Date:  02/28/2023   ID:  Gail Walker, DOB 10-07-1990, MRN [id number]  PCP:  [name], MD  Cardiologist:  None  Electrophysiologist:  None   Referring MD: Nestor Ramp, MD   No chief complaint on file. ***  History of Present Illness:    Gail Walker is a 32 y.o. female with a hx of asthma who is referred by Dr. Jennette Kettle for evaluation of chest pain.  She was seen in the ED for chest pain 02/07/2023.  Workup unremarkable.  Past Medical History:  Diagnosis Date   Acne    Asthma    uses albuterol inhaler daily   BV (bacterial vaginosis) 03/27/2020   CAP (community acquired pneumonia) 11/14/2015   Chlamydia 2012   GERD (gastroesophageal reflux disease)    during pregnancy - takes zantac   Gestational hypertension 11/05/2019   Indication for care in labor and delivery, antepartum 11/04/2019   Morning sickness 03/12/2018   Obesity affecting pregnancy 09/05/2019   Post-dates pregnancy 11/04/2019   Postpartum depression 03/27/2020   Rh negative state in antepartum period 02/23/2011   Supervision of high risk pregnancy, antepartum 01/26/2011    Nursing Staff Provider  Office Location [facility] Dating  Early [place]  Language  English Anatomy US   WNL  Flu Vaccine  Declined Genetic Screen  NIPS:   AFP:   First Screen:  Quad:    TDaP vaccine  08/16/19 Hgb A1C or  GTT Third trimester 105- WNL  Rhogam  09/05/19   LAB RESULTS   Feeding Plan bottle Blood Type A/Negative/-- (11/18 1656)   Contraception Postplacental Antibody Negative (11/18 1656)  Circ    Past Surgical History:  Procedure Laterality Date   DILATION AND EVACUATION N/A 05/16/2018   Procedure: DILATATION AND EVACUATION;  Surgeon: [place] Bing, MD;  Location: [facility];  Service: Gynecology;  Laterality: N/A;   OPERATIVE ULTRASOUND N/A 05/16/2018   Procedure: OPERATIVE ULTRASOUND;  Surgeon: [facility] Bing, MD;  Location: [facility];  Service: Gynecology;  Laterality:  N/A;   pregnancy termination      Current Medications: No outpatient medications have been marked as taking for the 03/02/23 encounter (Appointment) with Little Ishikawa, MD.     Allergies:   Patient has no known allergies.   Social History   Socioeconomic History   Marital status: Single    Spouse name: Not on file   Number of children: Not on file   Years of education: Not on file   Highest education level: Not on file  Occupational History   Occupation: warehouse work in a freezer  Tobacco Use   Smoking status: Former    Current packs/day: 0.00    Average packs/day: 0.3 packs/day for 3.0 years (0.8 ttl pk-yrs)    Types: Cigarettes    Start date: 03/05/2016    Quit date: 03/06/2019    Years since quitting: 3.9   Smokeless tobacco: Never  Vaping Use   Vaping status: Never Used  Substance and Sexual Activity   Alcohol use: No   Drug use: No   Sexual activity: Yes    Birth control/protection: None  Other Topics Concern   Not on file  Social History Narrative   Not on file   Social Determinants of Health   Financial Resource Strain: Not on file  Food Insecurity: No Food Insecurity (10/29/2019)   Hunger Vital Sign    Worried About Running Out of Food in the Last Year:  Never true    Ran Out of Food in the Last Year: Never true  Transportation Needs: No Transportation Needs (10/29/2019)   PRAPARE - Administrator, Civil Service (Medical): No    Lack of Transportation (Non-Medical): No  Physical Activity: Not on file  Stress: Not on file  Social Connections: Not on file     Family History: The patient's ***family history includes Cancer in her maternal aunt; Diabetes in her father. There is no history of Anesthesia problems, Hearing loss, Stroke, or CAD.  ROS:   Please see the history of present illness.    *** All other systems reviewed and are negative.  EKGs/Labs/Other Studies Reviewed:    The following studies were reviewed  today: ***  EKG:  EKG is *** ordered today.  The ekg ordered today demonstrates ***  Recent Labs: 11/10/2022: TSH 0.898 11/19/2022: ALT 16 02/07/2023: BUN 17; Creatinine, Ser 0.99; Hemoglobin 10.7; Platelets 311; Potassium 3.5; Sodium 133  Recent Lipid Panel No results found for: "CHOL", "TRIG", "HDL", "CHOLHDL", "VLDL", "LDLCALC", "LDLDIRECT"  Physical Exam:    VS:  LMP 01/12/2023     Wt Readings from Last 3 Encounters:  02/06/23 277 lb 9.6 oz (125.9 kg)  12/23/22 274 lb (124.3 kg)  12/13/22 273 lb 12.8 oz (124.2 kg)     GEN: *** Well nourished, well developed in no acute distress HEENT: Normal NECK: No JVD; No carotid bruits LYMPHATICS: No lymphadenopathy CARDIAC: ***RRR, no murmurs, rubs, gallops RESPIRATORY:  Clear to auscultation without rales, wheezing or rhonchi  ABDOMEN: Soft, non-tender, non-distended MUSCULOSKELETAL:  No edema; No deformity  SKIN: Warm and dry NEUROLOGIC:  Alert and oriented x 3 PSYCHIATRIC:  Normal affect   ASSESSMENT:    No diagnosis found. PLAN:    Chest pain:  RTC in***  Medication Adjustments/Labs and Tests Ordered: Current medicines are reviewed at length with the patient today.  Concerns regarding medicines are outlined above.  No orders of the defined types were placed in this encounter.  No orders of the defined types were placed in this encounter.   There are no Patient Instructions on file for this visit.   Signed, Little Ishikawa, MD  02/28/2023 10:52 AM     [facility]

## 2023-03-02 ENCOUNTER — Ambulatory Visit: Payer: Medicaid Other | Attending: Cardiology | Admitting: Cardiology

## 2023-04-07 ENCOUNTER — Other Ambulatory Visit: Payer: Self-pay | Admitting: Student

## 2023-04-13 ENCOUNTER — Other Ambulatory Visit (HOSPITAL_COMMUNITY)
Admission: RE | Admit: 2023-04-13 | Discharge: 2023-04-13 | Disposition: A | Discharge status: Home / Self Care | Payer: Medicaid Other | Source: Ambulatory Visit | Attending: Family Medicine | Admitting: Family Medicine

## 2023-04-13 ENCOUNTER — Encounter: Payer: Self-pay | Admitting: Family Medicine

## 2023-04-13 ENCOUNTER — Ambulatory Visit (INDEPENDENT_AMBULATORY_CARE_PROVIDER_SITE_OTHER): Payer: Medicaid Other | Admitting: Family Medicine

## 2023-04-13 VITALS — BP 131/93 | HR 103 | Wt 290.8 lb

## 2023-04-13 DIAGNOSIS — N898 Other specified noninflammatory disorders of vagina: Secondary | ICD-10-CM | POA: Insufficient documentation

## 2023-04-13 DIAGNOSIS — Z124 Encounter for screening for malignant neoplasm of cervix: Secondary | ICD-10-CM | POA: Diagnosis not present

## 2023-04-13 MED ORDER — FLUCONAZOLE 150 MG PO TABS: 150.0000 mg | ORAL_TABLET | Freq: Once | ORAL | 0 refills | Status: AC

## 2023-04-13 NOTE — Progress Notes (Signed)
    SUBJECTIVE:   CHIEF COMPLAINT / HPI:   1 week history of thick white vaginal discharge in addition to itching.  No urinary frequency or burning.  States that her last sexual intercourse was unprotected and several months ago.  States she is not concerned for sexually related infections but would like to be tested today.  Okay with doing Pap smear as well as she is due. Reports last menstrual cycle was 2-3 weeks ago. No sexual activity since then.   PERTINENT  PMH / PSH: Reviewed.  OBJECTIVE:   BP (!) 131/93   Pulse (!) 103   Wt 290 lb 12.8 oz (131.9 kg)   LMP 03/28/2023 (Approximate)   SpO2 100%   BMI 49.92 kg/m   General: NAD, well appearing Neuro: A&O Respiratory: normal WOB on RA Extremities: Moving all 4 extremities equally GU: Normal external genitalia, no obvious erythema, swelling, or lesions. White vaginal discharge, vaginal walls without erythema or lesions. Unable to visualize cervix, discussed with patient, may require repeat PAP Chaperone Volney Presser CMA    ASSESSMENT/PLAN:   Assessment & Plan Vaginal discharge Suspect yeast infection based on exam. Cervicovaginal ancillary testing sent for BV, as well as Trichomonas, Gonorrhea, Chlamydia per patient request. Will treat as indicated. Empiric treatment for yeast with 150mg  x1 Diflucan. Can be reasonably certain patient is not pregnant given history; however, she is not on birth control. Discussed risks if she is pregnant, patient request diflucan.  Cervical cancer screening Unable to visualize cervix due to extent of vaginal tissue. Blind swab performed. Patient aware, expect transition zone to not be present and will need repeat pap with larger speculum.  Precepted with Dr. Manson Passey.  Return if symptoms worsen or fail to improve.  Celine Mans, MD [facility]

## 2023-04-13 NOTE — Patient Instructions (Addendum)
It was great to see you! Thank you for allowing me to participate in your care!  Our plans for today:  - I will let you know the results of your testing, and if we need to treat anything else. - I have sent DiFlucan in to your pharmacy. This can treat your yeast infection.  Please arrive 15 minutes PRIOR to your next scheduled appointment time! If you do not, this affects OTHER patients' care.  Take care and seek immediate care sooner if you develop any concerns.   Celine Mans, MD, PGY-2 [facility] 4:11 PM 04/13/2023  [facility]

## 2023-04-14 LAB — RPR: RPR Ser Ql: NONREACTIVE

## 2023-04-14 LAB — HIV ANTIBODY (ROUTINE TESTING W REFLEX): HIV Screen 4th Generation wRfx: NONREACTIVE

## 2023-04-14 NOTE — Addendum Note (Signed)
Addended by: Celine Mans on: 04/14/2023 12:05 PM   Modules accepted: Orders

## 2023-04-17 LAB — CYTOLOGY - PAP
Chlamydia: NEGATIVE
Comment: NEGATIVE
Comment: NEGATIVE
Comment: NEGATIVE
Comment: NORMAL
Diagnosis: NEGATIVE
High risk HPV: NEGATIVE
Neisseria Gonorrhea: NEGATIVE
Trichomonas: NEGATIVE

## 2023-04-17 LAB — CERVICOVAGINAL ANCILLARY ONLY
Bacterial Vaginitis (gardnerella): POSITIVE — AB
Candida Glabrata: NEGATIVE
Candida Vaginitis: POSITIVE — AB
Comment: NEGATIVE
Comment: NEGATIVE
Comment: NEGATIVE
Comment: NEGATIVE
Trichomonas: NEGATIVE

## 2023-05-09 ENCOUNTER — Encounter: Payer: Self-pay | Admitting: Family Medicine

## 2023-05-09 DIAGNOSIS — B9689 Other specified bacterial agents as the cause of diseases classified elsewhere: Secondary | ICD-10-CM

## 2023-05-09 MED ORDER — METRONIDAZOLE 500 MG PO TABS: 500.0000 mg | ORAL_TABLET | Freq: Two times a day (BID) | ORAL | 0 refills | Status: AC

## 2023-06-21 ENCOUNTER — Other Ambulatory Visit: Payer: Self-pay | Admitting: Student

## 2023-06-29 ENCOUNTER — Ambulatory Visit (INDEPENDENT_AMBULATORY_CARE_PROVIDER_SITE_OTHER): Admitting: Student

## 2023-06-29 VITALS — BP 142/102 | HR 105 | Ht 64.0 in | Wt 280.6 lb

## 2023-06-29 DIAGNOSIS — I10 Essential (primary) hypertension: Secondary | ICD-10-CM | POA: Diagnosis present

## 2023-06-29 MED ORDER — AMLODIPINE BESYLATE 5 MG PO TABS: 5.0000 mg | ORAL_TABLET | Freq: Every day | ORAL | 0 refills | Status: DC

## 2023-06-29 NOTE — Patient Instructions (Signed)
 It was great seeing you today.  As we discussed, - Start taking Amlodipine 5 mg daily for high blood pressure. It is important to take this every single day. Your goal blood pressure is 130/80 or less. - Please schedule a nurse visit in 2 weeks   If you have any questions or concerns, please feel free to call the clinic.   Have a wonderful day,  Dr. Darral Dash [facility] [phone]

## 2023-06-29 NOTE — Progress Notes (Signed)
    SUBJECTIVE:   CHIEF COMPLAINT / HPI:   History of Present Illness   The patient, a 33 year old with a history of hypertension, presents for a follow-up visit. She reports that her blood pressure readings at home are similar to those in the office, which are elevated. She has been working on lifestyle modifications, including weight loss, and has lost 10 pounds since December. She is also attending a weight management clinic and is awaiting insurance approval for [facility]. She has a history of chest pain, which has been evaluated in the hospital in the past, but no cause has been identified. She denies any family history of hypertension.       PERTINENT  PMH / PSH: Depression Obesity OSA  OBJECTIVE:   BP (!) 142/102   Pulse (!) 105   Ht 5\' 4"  (1.626 m)   Wt 280 lb 9.6 oz (127.3 kg)   SpO2 100%   BMI 48.16 kg/m   General: NAD, well appearing, obese HEENT: No thyroid enlargement or palpable nodules. Cardiac: RRR Neuro: A&O Respiratory: normal WOB on RA. No wheezing or crackles on auscultation, good lung sounds throughout Extremities: Moving all 4 extremities equally    ASSESSMENT/PLAN:   Hypertension Confirmed with elevated readings. Amlodipine chosen for safety, especially considering potential pregnancy. Weight loss may resolve hypertension. - Start amlodipine 5 mg daily. - Recheck blood pressure in two weeks with nurse visit.  If blood pressure not at goal of 130/80 or less, increase amlodipine to 10 mg daily. - Set blood pressure goal to 130/80 mmHg or lower. - Educated on potential side effects of amlodipine, including leg swelling. - Discussed non-permanence of medication depending on weight management and thyroid function.     Darral Dash, DO [facility]

## 2023-06-29 NOTE — Assessment & Plan Note (Addendum)
 Confirmed with elevated readings. Amlodipine chosen for safety, especially considering potential pregnancy. Weight loss may resolve hypertension. - Start amlodipine 5 mg daily. - Recheck blood pressure in two weeks with nurse visit.  If blood pressure not at goal of 130/80 or less, increase amlodipine to 10 mg daily. - Set blood pressure goal to 130/80 mmHg or lower. - Educated on potential side effects of amlodipine, including leg swelling. - Discussed non-permanence of medication depending on weight management and thyroid function.

## 2023-06-30 LAB — TSH RFX ON ABNORMAL TO FREE T4: TSH: 1 u[IU]/mL (ref 0.450–4.500)

## 2023-07-04 ENCOUNTER — Encounter: Payer: Self-pay | Admitting: Student

## 2023-07-14 ENCOUNTER — Encounter: Payer: Self-pay | Admitting: Student

## 2023-07-14 ENCOUNTER — Ambulatory Visit: Payer: Self-pay | Admitting: Student

## 2023-07-14 VITALS — BP 130/99 | HR 87 | Ht 64.0 in | Wt 279.1 lb

## 2023-07-14 DIAGNOSIS — I10 Essential (primary) hypertension: Secondary | ICD-10-CM

## 2023-07-14 MED ORDER — AMLODIPINE BESYLATE 5 MG PO TABS: 10.0000 mg | ORAL_TABLET | Freq: Every day | ORAL | 0 refills | Status: AC

## 2023-07-14 NOTE — Assessment & Plan Note (Signed)
 Hypertension remains uncontrolled on current amlodipine dose. No adverse effects reported. Home monitoring not yet established. Dr. Raymondo Band provided BP cuff in office for assessment and log purposes for patient to take home.  - Increase amlodipine dose  - Consider combination therapy if criteria met.

## 2023-07-14 NOTE — Patient Instructions (Addendum)
 It was great to see you today! Thank you for choosing [facility] for your primary care.  Today we addressed: Continue amlodipine and follow up in 1 month   Blood Pressure Record Sheet To take your blood pressure, you will need a blood pressure machine. You can buy a blood pressure machine (blood pressure monitor) at your clinic, drug store, or online. When choosing one, consider: An automatic monitor that has an arm cuff. A cuff that wraps snugly around your upper arm. You should be able to fit only one finger between your arm and the cuff. A device that stores blood pressure reading results. Do not choose a monitor that measures your blood pressure from your wrist or finger. Follow your health care provider's instructions for how to take your blood pressure. To use this form: Take your blood pressure medications every day These measurements should be taken when you have been at rest for at least 10-15 min Take at least 2 readings with each blood pressure check. This makes sure the results are correct. Wait 1-2 minutes between measurements. Write down the results in the spaces on this form. Keep in mind it should always be recorded systolic over diastolic. Both numbers are important.  Repeat this every day for 2-3 weeks, or as told by your health care provider.  Make a follow-up appointment with your health care provider to discuss the results.  Blood Pressure Log Date Medications taken? (Y/N) Blood Pressure Time of Day                                                                                                         If you haven't already, sign up for My Chart to have easy access to your labs results, and communication with your primary care physician.  Follow up 1 month  Please arrive 15 minutes before your appointment to ensure smooth check in process.  We appreciate your efforts in making this happen.  Thank you for allowing me to participate in  your care, [name], MD 07/14/2023, 2:41 PM PGY-3, [facility]

## 2023-07-14 NOTE — Progress Notes (Signed)
    SUBJECTIVE:   CHIEF COMPLAINT / HPI:   HTN: The patient, recently started on amlodipine for hypertension, presents for a follow-up. She reports no adverse effects such as swelling in the arms or legs, chest pain, or headaches. She does not currently have a blood pressure monitor at home but expresses an interest in obtaining one. She reports that she can often tell when her blood pressure is high due to symptoms of anxiety. She takes her amlodipine at night and has taken it today. She denies any current headache or anxiety symptoms.  PERTINENT  PMH / PSH:  HTN Asthma  OSA Allergic Rhinitis  Eczema  Anxiety  Depression  Tobacco Use   OBJECTIVE:   BP (!) 130/99   Pulse 87   Ht 5\' 4"  (1.626 m)   Wt 279 lb 2 oz (126.6 kg)   LMP 07/12/2023   SpO2 100%   BMI 47.91 kg/m   General: Alert and oriented in no apparent distress Heart: Regular rate and rhythm with no murmurs appreciated Lungs: Normal WOB Abdomen: no abdominal pain Skin: Warm and dry    ASSESSMENT/PLAN:   Assessment & Plan Primary hypertension  Hypertension remains uncontrolled on current amlodipine dose. No adverse effects reported. Home monitoring not yet established. Dr. Raymondo Band provided BP cuff in office for assessment and log purposes for patient to take home.  - Increase amlodipine dose  - Consider combination therapy if criteria met.     [name], MD [facility]

## 2023-07-17 NOTE — Progress Notes (Signed)
 Home Blood Pressure Monitor Provided to patient from [employer] 2024  Large cuff supplied.

## 2023-09-01 ENCOUNTER — Other Ambulatory Visit: Payer: Self-pay

## 2023-09-03 MED ORDER — ALBUTEROL SULFATE (2.5 MG/3ML) 0.083% IN NEBU: 5.0000 mg | INHALATION_SOLUTION | RESPIRATORY_TRACT | 1 refills | Status: DC | PRN

## 2023-09-26 ENCOUNTER — Encounter: Payer: Self-pay | Admitting: *Deleted

## 2023-10-09 ENCOUNTER — Other Ambulatory Visit: Payer: Self-pay

## 2023-10-09 MED ORDER — DULERA 100-5 MCG/ACT IN AERO: 2.0000 | INHALATION_SPRAY | Freq: Two times a day (BID) | RESPIRATORY_TRACT | 1 refills | Status: DC

## 2023-10-09 MED ORDER — TRIAMCINOLONE ACETONIDE 0.5 % EX OINT: 1.0000 | TOPICAL_OINTMENT | Freq: Two times a day (BID) | CUTANEOUS | 0 refills | Status: DC

## 2023-11-09 ENCOUNTER — Ambulatory Visit

## 2023-11-09 VITALS — BP 112/82 | HR 106 | Ht 64.0 in | Wt 240.0 lb

## 2023-11-09 DIAGNOSIS — J4540 Moderate persistent asthma, uncomplicated: Secondary | ICD-10-CM

## 2023-11-09 DIAGNOSIS — Z3169 Encounter for other general counseling and advice on procreation: Secondary | ICD-10-CM

## 2023-11-09 DIAGNOSIS — Z6841 Body Mass Index (BMI) 40.0 and over, adult: Secondary | ICD-10-CM

## 2023-11-09 MED ORDER — ALBUTEROL SULFATE (2.5 MG/3ML) 0.083% IN NEBU: 2.5000 mg | INHALATION_SOLUTION | RESPIRATORY_TRACT | 1 refills | Status: AC | PRN

## 2023-11-09 NOTE — Progress Notes (Signed)
    SUBJECTIVE:   CHIEF COMPLAINT / HPI:   Fertility concerns: Has been having unprotected sex with a partner for 3 years, but has been timing colitis with fertility window for the last 3 months.  2 previous pregnancies, last pregnancy was 4 years ago.  Reports regular periods about every 25 days with moderate bleeding that lasts about 5 days.  Has not used LH strips.  Partner fathered a child 3 years ago to different partner, has had sperm test about 3 years ago which was reportedly normal and is the one that desires the pregnancy most.  She is using a fertility tracking app, having sex about 3 times a week.  Obesity: Has lost 50 pounds since March with Wegovy and diet changes.  Reports eating what ever she wants but in small quantities, and not doing much exercise.  Desires to lose 50 more pounds, partner is also trying to lose weight with her.  Reports she eats what ever  Asthma: Reports asthma is worse with anxiety attacks, and is most frequently using inhaler for these.  Denies any recent hospitalizations or emergency room visits for asthma exacerbations.  PERTINENT  PMH / PSH: Dilation and evacuation January 2020  OBJECTIVE:   BP 112/82   Pulse (!) 106   Ht 5' 4 (1.626 m)   Wt 240 lb (108.9 kg)   SpO2 98%   BMI 41.20 kg/m    Cardiac: Regular rate and rhythm. Normal S1/S2. No murmurs, rubs, or gallops appreciated. Lungs: Clear bilaterally to ascultation.  Abdomen: Normoactive bowel sounds. No tenderness to deep or light palpation. No rebound or guarding.    Psych: Pleasant and appropriate    ASSESSMENT/PLAN:   Assessment & Plan Moderate persistent asthma without complication Well-controlled, no recent hospitalizations or ED visits, most, trigger R anxiety attacks. -Refilled albuterol  Infertility counseling Discussed methods and approaches to improving fertility including no ejaculation for 2 days prior to colitis and fertility window,  calculating fertility window using  apps and LH strips.  Explained that this is not infertility until 12 months of colitis during fertility window.  Will consider ultrasound of uterus and ovaries to evaluate for cysts and masses at 73-month follow-up -Instructed to have colitis every other day during fertility window.  With 2-day ejaculation break prior to starting infertility window. -Encouraged to continue losing weight for both her and her partner as this will also help improve fertility. - Follow-up in 3 months  BMI 40.0-44.9, adult (HCC) Discussed importance of healthy diet, even in small portion sizes.  As well as importance of daily aerobic exercise.  Also explained the correlation of obesity with infertility for both men and women.     Fairy Amy, MD [facility]

## 2023-11-09 NOTE — Assessment & Plan Note (Signed)
 Well-controlled, no recent hospitalizations or ED visits, most, trigger R anxiety attacks. -Refilled albuterol 

## 2023-11-09 NOTE — Assessment & Plan Note (Signed)
 Discussed importance of healthy diet, even in small portion sizes.  As well as importance of daily aerobic exercise.  Also explained the correlation of obesity with infertility for both men and women.

## 2023-11-09 NOTE — Patient Instructions (Signed)
 Thank you for visiting the clinic today, it was good to see you!  Please always bring your medication bottles  In today's visit we discussed:  Infertility: Continue the active lifestyle changes and healthy diet changes that you have made to lose weight, this will help for both you and your partner. Have sex every other day of the fertile window according to your app, probably about 6-7 days after your period. You can use LH strips over the counter to know exactly when ovulation has occurred since your periods are a little short. Have your partner wait for 2 days without ejaculation prior to sex in the fertile window, this is when sperm viability is highest.  Please follow-up in 3 months  For any questions, please call the office at [phone] or send me a message in MyChart. Have a great day!  -Fairy Amy, MD  [facility] Resident, PGY-1

## 2024-01-18 ENCOUNTER — Other Ambulatory Visit: Payer: Self-pay

## 2024-01-19 MED ORDER — DULERA 100-5 MCG/ACT IN AERO: 2.0000 | INHALATION_SPRAY | Freq: Two times a day (BID) | RESPIRATORY_TRACT | 1 refills | Status: AC

## 2024-03-12 ENCOUNTER — Telehealth: Payer: Self-pay

## 2024-03-12 NOTE — Telephone Encounter (Signed)
 Patient calls nurse line requesting a refill on Triamcinolone  Cream.   She reports she is requesting the jar size.   Advised will forward to PCP.

## 2024-03-13 MED ORDER — TRIAMCINOLONE ACETONIDE 0.5 % EX OINT: 1.0000 | TOPICAL_OINTMENT | Freq: Two times a day (BID) | CUTANEOUS | 0 refills | Status: AC

## 2024-05-06 ENCOUNTER — Encounter (HOSPITAL_COMMUNITY): Payer: Self-pay

## 2024-05-06 ENCOUNTER — Emergency Department (HOSPITAL_COMMUNITY)

## 2024-05-06 ENCOUNTER — Emergency Department (HOSPITAL_COMMUNITY)
Admission: EM | Admit: 2024-05-06 | Discharge: 2024-05-06 | Disposition: A | Discharge status: Home / Self Care | Attending: Emergency Medicine | Admitting: Emergency Medicine

## 2024-05-06 DIAGNOSIS — X501XXA Overexertion from prolonged static or awkward postures, initial encounter: Secondary | ICD-10-CM | POA: Insufficient documentation

## 2024-05-06 DIAGNOSIS — M25562 Pain in left knee: Secondary | ICD-10-CM | POA: Diagnosis present

## 2024-05-06 DIAGNOSIS — S8392XA Sprain of unspecified site of left knee, initial encounter: Secondary | ICD-10-CM | POA: Diagnosis not present

## 2024-05-06 DIAGNOSIS — J45909 Unspecified asthma, uncomplicated: Secondary | ICD-10-CM | POA: Diagnosis not present

## 2024-05-06 MED ORDER — KETOROLAC TROMETHAMINE 60 MG/2ML IM SOLN
30.0000 mg | Freq: Once | INTRAMUSCULAR | Status: AC
  Administered 2024-05-06: 30 mg via INTRAMUSCULAR
  Filled 2024-05-06: qty 2

## 2024-05-06 NOTE — Discharge Instructions (Addendum)
 Your x-ray does not show any fractures or dislocations.  You likely have a soft tissue injury of your left knee.  Take ibuprofen  and Tylenol  for pain and soreness.  It is okay to put weight on your left leg within the tolerance of your pain.  Avoid activities that significantly worsen pain.  Call the telephone number below to set up a follow-up appointment with an orthopedic doctor.  Return to the emergency department for any new or worsening symptoms of concern.

## 2024-05-06 NOTE — ED Provider Notes (Signed)
 " [place] EMERGENCY DEPARTMENT AT [facility] Provider Note   CSN: [id number] Arrival date & time: 05/06/24  2224     Patient presents with: Knee Pain   Gail Walker is a 34 y.o. female.    Knee Pain Patient presents for left knee pain.  Medical history includes anxiety, depression, OSA, asthma.  Earlier today, she was in her normal state of health.  Prior to arrival, while walking down steps, she took a wrong step and lost her balance.  This did cause her to fall.  During her fall, she had an abnormal twisting motion to her left knee.  She has had pain to this area since.  She denies any other areas of pain or injury concern.  She has not been able to walk on her left leg due to the left knee pain.  She took an Excedrin prior to arrival.     Prior to Admission medications  Medication Sig Start Date End Date Taking? Authorizing Provider  albuterol  (PROVENTIL ) (2.5 MG/3ML) 0.083% nebulizer solution Take 3 mLs (2.5 mg total) by nebulization every 4 (four) hours as needed for wheezing or shortness of breath. 11/09/23   Lorrane Pac, MD  amLODipine  (NORVASC ) 5 MG tablet Take 2 tablets (10 mg total) by mouth at bedtime. Patient not taking: Reported on 11/09/2023 07/14/23   Bryan Bianchi, MD  mometasone -formoterol  (DULERA ) 100-5 MCG/ACT AERO Inhale 2 puffs into the lungs in the morning and at bedtime. 01/19/24   Cleotilde Lukes, DO  triamcinolone  ointment (KENALOG ) 0.5 % Apply 1 Application topically 2 (two) times daily. 03/13/24   Cleotilde Lukes, DO  FLUoxetine  (PROZAC ) 20 MG capsule Take 1 capsule (20 mg total) by mouth daily. 03/28/20 04/27/20  Lenon Chiquita BROCKS, DO    Allergies: Patient has no known allergies.    Review of Systems  Musculoskeletal:  Positive for arthralgias.  All other systems reviewed and are negative.   Updated Vital Signs BP (!) 138/96 (BP Location: Left Arm)   Pulse 88   Temp 97.7 F (36.5 C) (Oral)   Resp 16   LMP 04/06/2024  (Approximate)   SpO2 100%   Physical Exam Vitals and nursing note reviewed.  Constitutional:      General: She is not in acute distress.    Appearance: Normal appearance. She is well-developed. She is not ill-appearing, toxic-appearing or diaphoretic.  HENT:     Head: Normocephalic and atraumatic.     Right Ear: External ear normal.     Left Ear: External ear normal.     Nose: Nose normal.     Mouth/Throat:     Mouth: Mucous membranes are moist.  Eyes:     Extraocular Movements: Extraocular movements intact.     Conjunctiva/sclera: Conjunctivae normal.  Cardiovascular:     Rate and Rhythm: Normal rate and regular rhythm.  Pulmonary:     Effort: Pulmonary effort is normal. No respiratory distress.  Abdominal:     General: There is no distension.     Palpations: Abdomen is soft.  Musculoskeletal:        General: Tenderness and signs of injury present.     Cervical back: Normal range of motion and neck supple.  Skin:    General: Skin is warm and dry.     Coloration: Skin is not jaundiced or pale.  Neurological:     General: No focal deficit present.     Mental Status: She is alert and oriented to person, place, and time.  Psychiatric:  Mood and Affect: Mood normal.        Behavior: Behavior normal.     (all labs ordered are listed, but only abnormal results are displayed) Labs Reviewed - No data to display  EKG: None  Radiology: DG Knee Complete 4 Views Left Result Date: 05/06/2024 CLINICAL DATA:  Clemens off porch twisting injury EXAM: LEFT KNEE - COMPLETE 4+ VIEW COMPARISON:  None Available. FINDINGS: No fracture or malalignment. Joint spaces are patent. Small moderate knee effusion IMPRESSION: No acute osseous abnormality. Small to moderate knee effusion. Electronically Signed   By: Luke Bun M.D.   On: 05/06/2024 22:53     Procedures   Medications Ordered in the ED  ketorolac  (TORADOL ) injection 30 mg (30 mg Intramuscular Given 05/06/24 2332)                                     Medical Decision Making Amount and/or Complexity of Data Reviewed Radiology: ordered.  Risk Prescription drug management.   Patient presents for left knee pain following a fall that occurred earlier today.  Fall was mechanical in nature.  She did have abnormal twisting motion of her left knee as she fell.  She has not had any other areas of discomfort or suspected injury.  On arrival in the ED, vital signs are normal.  Patient is overall well-appearing on exam.  She describes the area pain is primarily at the inferior/anterior aspect of her knee.  Swelling is difficult to appreciate on exam.  She does have some mild tenderness.  Patient underwent x-ray imaging which did not show any acute osseous abnormalities.  I suspect soft tissue injury.  Ace wrap was provided.  Patient was given Toradol  for analgesia.  She declines any prescription pain medication.  Patient to treat pain with over-the-counter medications and follow-up with orthopedic surgery.  Crutches were provided.  Patient was discharged in stable condition.     Final diagnoses:  Sprain of left knee, unspecified ligament, initial encounter    ED Discharge Orders     None          Melvenia Motto, MD 05/06/24 2333  "

## 2024-05-06 NOTE — ED Triage Notes (Signed)
 Patient arrived with complaints of left knee pain. Reports mechanical fall three hours ago, declines hitting her head, no relief with otc meds.

## 2024-05-17 ENCOUNTER — Ambulatory Visit: Payer: Self-pay

## 2024-05-17 NOTE — Progress Notes (Unsigned)
" ° ° °  SUBJECTIVE:   CHIEF COMPLAINT / HPI:   Needs refill  PERTINENT  PMH / PSH: ***  OBJECTIVE:   LMP 04/06/2024 (Approximate)   ***  ASSESSMENT/PLAN:   Assessment & Plan      Lucie Pinal, DO [facility] "
